# Patient Record
Sex: Female | Born: 2000 | Race: Black or African American | Hispanic: No | Marital: Single | Smoking: Former smoker
Health system: Southern US, Community
[De-identification: ages and names within clinical notes are randomized; demographics above are authoritative.]

## PROBLEM LIST (undated history)

## (undated) DIAGNOSIS — F32A Depression, unspecified: Secondary | ICD-10-CM

## (undated) DIAGNOSIS — N151 Renal and perinephric abscess: Secondary | ICD-10-CM

## (undated) DIAGNOSIS — E785 Hyperlipidemia, unspecified: Secondary | ICD-10-CM

## (undated) HISTORY — PX: NO PAST SURGERIES: SHX2092

## (undated) HISTORY — DX: Hyperlipidemia, unspecified: E78.5

---
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
## 2021-02-26 IMAGING — US US MFM OB FOLLOW-UP
1 series · 13 of 28 positions shown · non-contrast
Comparison: none

[Series 1: us mfm ob follow-up · 54 acquisitions, 13 frames shown]
[im 2/54]
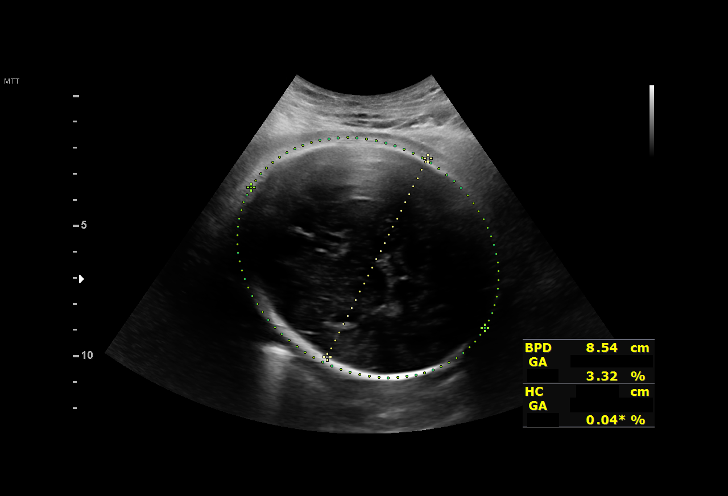
[im 6/54]
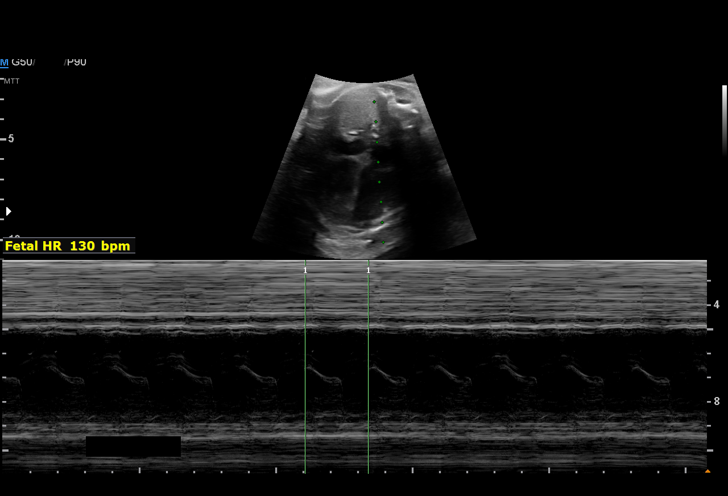
[im 10/54]
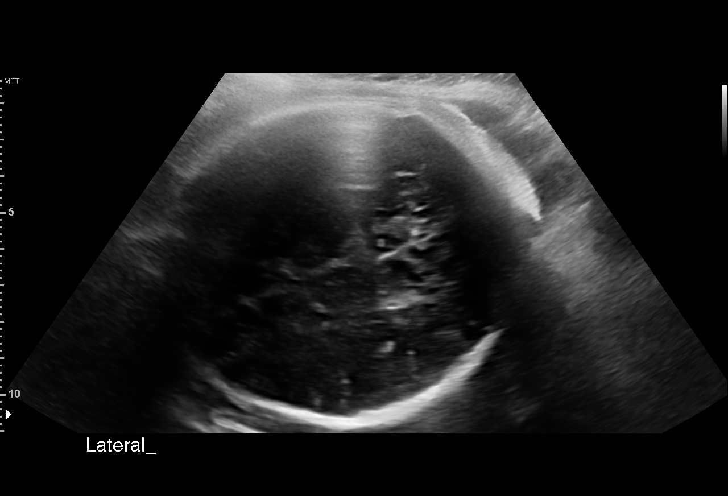
[im 14/54]
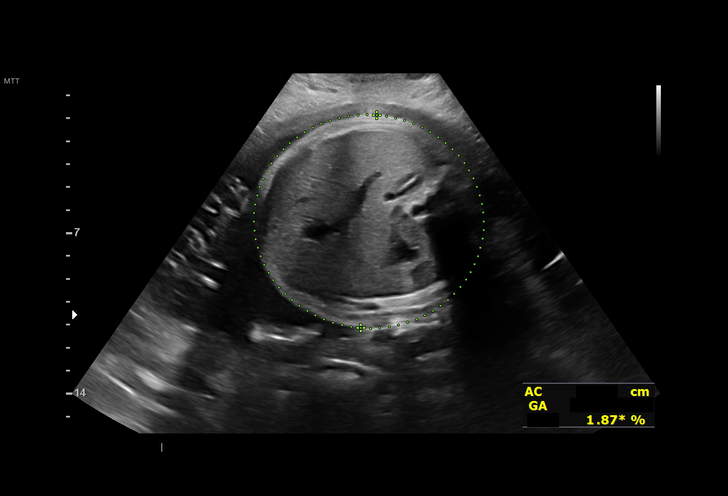
[im 18/54]
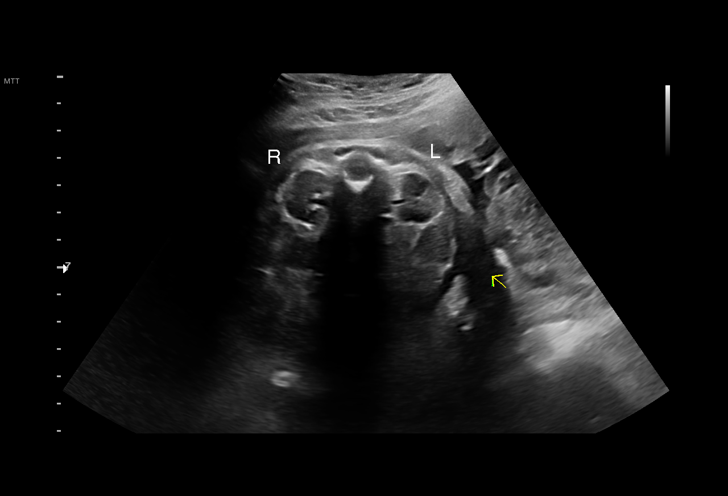
[im 22/54]
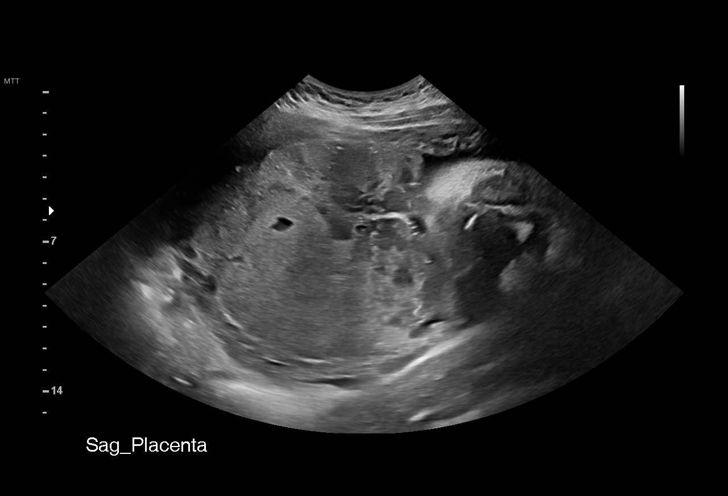
[im 28/54]
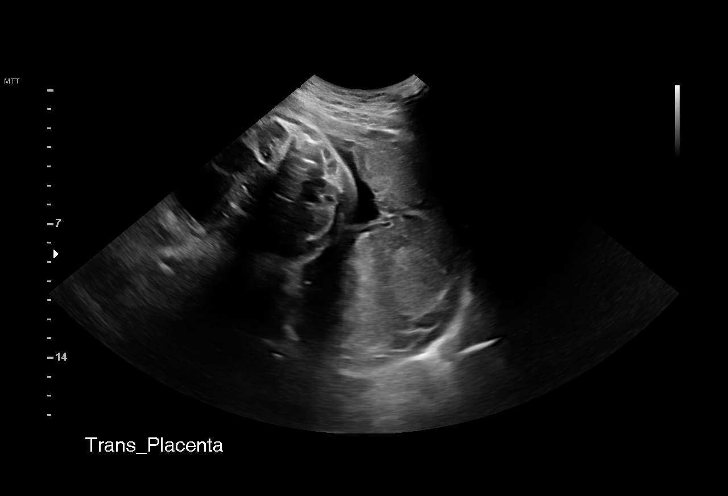
[im 32/54]
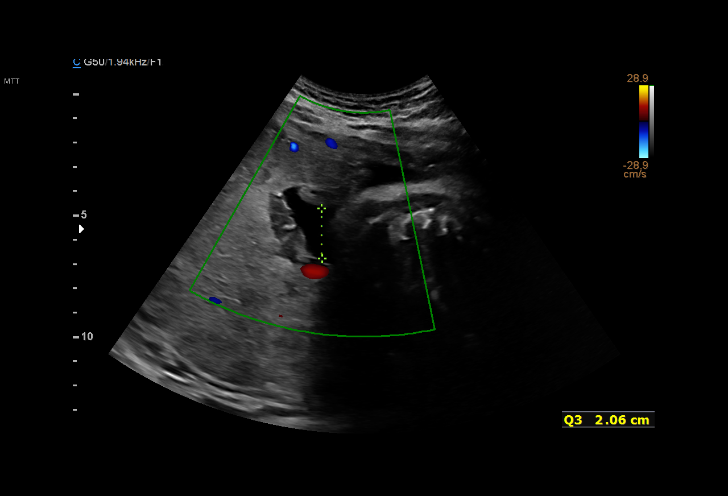
[im 36/54]
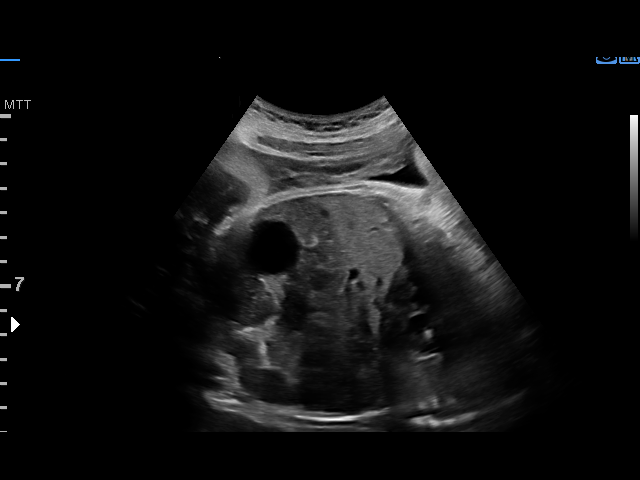
[im 40/54]
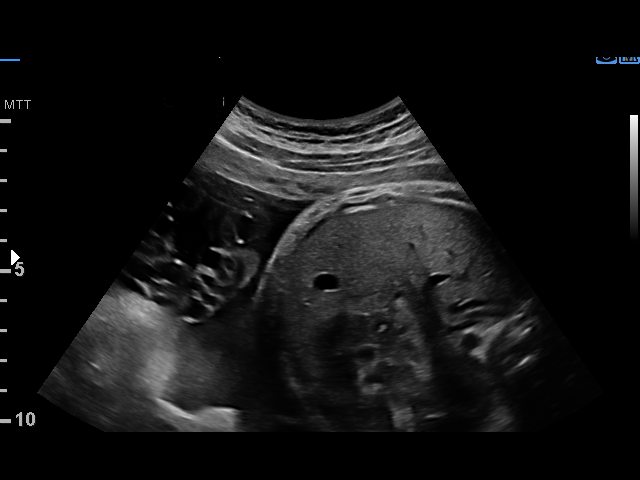
[im 44/54]
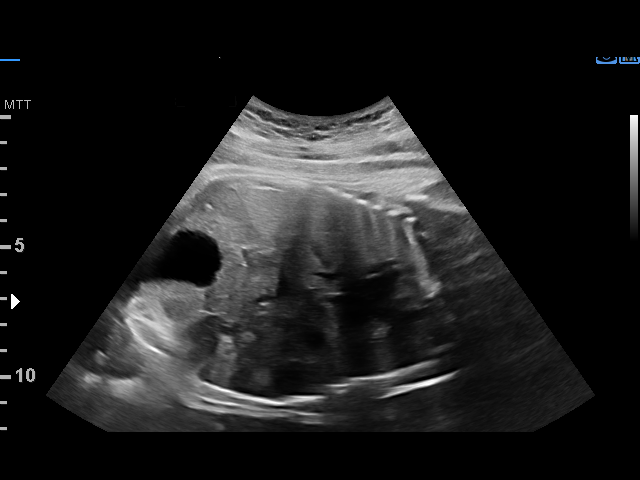
[im 48/54]
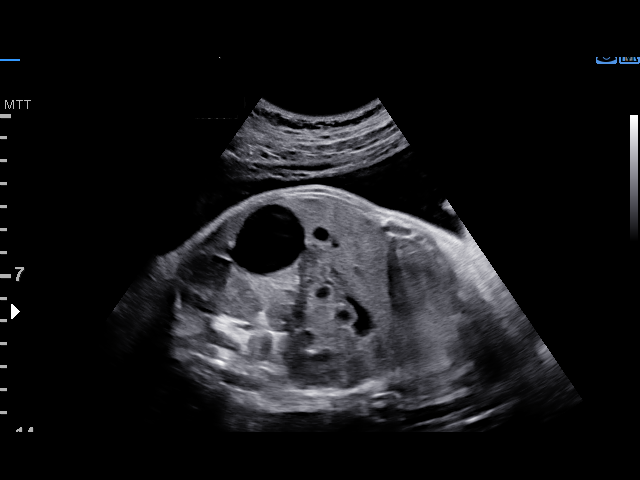
[im 52/54]
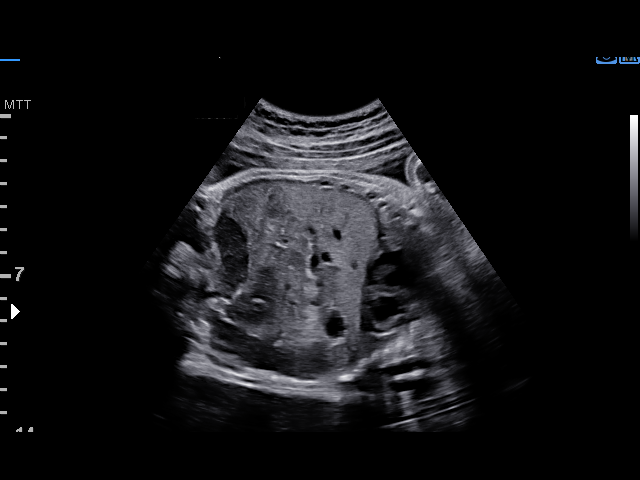

[13 of 28 positions shown; findings below may reference images not displayed]

Indications

 37 weeks gestation of pregnancy
 Maternal care for known or suspected poor
 fetal growth, third trimester, fetus 1 IUGR
 Small for gestational age fetus affecting
 management of mother
 Low Risk NIPS
Fetal Evaluation

 Num Of Fetuses:         1
 Fetal Heart Rate(bpm):  130
 Cardiac Activity:       Observed
 Presentation:           Cephalic
 Placenta:               Posterior Fundal
 P. Cord Insertion:      Previously Visualized

 Amniotic Fluid
 AFI FV:      Within normal limits

 AFI Sum(cm)     %Tile       Largest Pocket(cm)
 18.3            71

 RUQ(cm)       RLQ(cm)       LUQ(cm)        LLQ(cm)

Biophysical Evaluation

 Amniotic F.V:   Pocket => 2 cm             F. Tone:        Observed
 F. Movement:    Observed                   Score:          [DATE]
 F. Breathing:   Observed
Biometry

 BPD:      85.1  mm     G. Age:  34w 2d        2.7  %    CI:        79.34   %    70 - 86
                                                         FL/HC:      21.4   %    20.9 -
 HC:       302   mm     G. Age:  33w 4d        < 1  %    HC/AC:      0.99        0.92 -
 AC:      304.3  mm     G. Age:  34w 3d        2.3  %    FL/BPD:     76.0   %    71 - 87
 FL:       64.7  mm     G. Age:  33w 3d        < 1  %    FL/AC:      21.3   %    20 - 24
 LV:        3.2  mm

 Est. FW:    7177  gm      5 lb 2 oz      2  %
OB History

 Gravidity:    1         Term:   0
 Living:       0
Gestational Age

 U/S Today:     34w 0d                                        EDD:   12/27/19
 Best:          37w 4d     Det. By:  Previous Ultrasound      EDD:   12/02/19
                                     (04/09/19)
Anatomy

 Cranium:               Previously seen        Aortic Arch:            Previously seen
 Cavum:                 Previously seen        Ductal Arch:            Previously seen
 Ventricles:            Appears normal         Diaphragm:              Appears normal
 Choroid Plexus:        Previously seen        Stomach:                Appears normal, left
                                                                       sided
 Cerebellum:            Previously seen        Abdomen:                Previously seen
 Posterior Fossa:       Previously seen        Abdominal Wall:         Previously seen
 Nuchal Fold:           Previously seen        Cord Vessels:           Previously seen
 Face:                  Orbits and profile     Kidneys:                Appear normal
                        previously seen
 Lips:                  Previously seen        Bladder:                Appears normal
 Thoracic:              Previously seen        Spine:                  Previously seen
 Heart:                 Previously seen        Upper Extremities:      Previously seen
 RVOT:                  Previously seen        Lower Extremities:      Previously seen
 LVOT:                  Previously seen

 Other:  Technically difficult due to advanced GA and fetal position.
Doppler - Fetal Vessels

 Umbilical Artery
  S/D     %tile      RI    %tile                             ADFV    RDFV
  3.12       90    0.68       92                                No      No

Cervix Uterus Adnexa
 Cervix
 Not visualized (advanced GA >36wks)

 Uterus
 No abnormality visualized.

 Right Ovary
 Within normal limits. No adnexal mass visualized.

 Left Ovary
 Within normal limits. No adnexal mass visualized.

 Cul De Sac
 No free fluid seen.

 Adnexa
 No abnormality visualized.
Impression

 Patient with fetal growth restriction returned for growth and
 antenatal testing.

 The estimated fetal weight is at the 2nd percentile. HC
 measurement is at between -2 and -1 SD (no evidence of
 microcephaly). Interval weight gain is 338 grams
 (suboptimal). Amniotic fluid is normal and good fetal activity
 is seen .Antenatal testing is reassuring. BPP [DATE]. Umbilical
 artery Doppler showed normal forward diastolic flow .

 I counseled the couple on the finding of severe fetal growth
 restriction. Ultrasound has limitations in accurately-estimating
 fetal weights. I recommended delivery.

 Patient agreed to be delivered.

 Discussed with Dr. Sinorigen, Ob Attending, who will be arranging
 induction of labor.
                 Arraa, Ahumadh

## 2021-04-08 ENCOUNTER — Other Ambulatory Visit: Payer: Self-pay

## 2021-04-08 ENCOUNTER — Encounter: Payer: Self-pay | Admitting: Emergency Medicine

## 2021-04-08 ENCOUNTER — Ambulatory Visit
Admission: EM | Admit: 2021-04-08 | Discharge: 2021-04-08 | Disposition: A | Discharge status: Home / Self Care | Payer: Medicaid Other | Attending: Emergency Medicine | Admitting: Emergency Medicine

## 2021-04-08 DIAGNOSIS — H66003 Acute suppurative otitis media without spontaneous rupture of ear drum, bilateral: Secondary | ICD-10-CM

## 2021-04-08 MED ORDER — CEFDINIR 300 MG PO CAPS: 300.0000 mg | ORAL_CAPSULE | Freq: Two times a day (BID) | ORAL | 0 refills | Status: AC

## 2021-04-08 MED ORDER — IBUPROFEN 800 MG PO TABS: 800.0000 mg | ORAL_TABLET | Freq: Three times a day (TID) | ORAL | 0 refills | Status: DC | PRN

## 2021-04-08 MED ORDER — KETOROLAC TROMETHAMINE 60 MG/2ML IM SOLN: 60.0000 mg | Freq: Once | INTRAMUSCULAR | Status: DC

## 2021-04-08 NOTE — ED Triage Notes (Signed)
Pt c/o right ear pain that started today.  ?

## 2021-04-08 NOTE — Discharge Instructions (Addendum)
Please begin cefdinir now, 1 capsule twice daily for your ear infections in both ears.  Please also begin ibuprofen 800 mg for pain relief.  Please return in 3 to 5 days if you have not had some improvement of your symptoms. ?

## 2021-04-08 NOTE — ED Provider Notes (Signed)
[facility]    CSN: [id number] Arrival date & time: 04/08/21  1242    HISTORY   Chief Complaint  Patient presents with   Otalgia   HPI Rachael [name] is a 21 y.o. female. Patient complains of acute onset of right-sided ear pain.  Patient is tearful and exhibiting signs of mild hysteria as I walk in the room today.  Patient so reeks of cigarette smoke.  Not able to obtain meaningful history from the patient as she keeps biting me to make the pain stop.  The history is provided by the patient.  Past Medical History:  Diagnosis Date   Depression    few yrs ago [facility](BH), doing good now   Hyperlipidemia    Kidney abscess    Patient Active Problem List   Diagnosis Date Noted   Vaginal delivery 11/16/2019   IUGR (intrauterine growth restriction) affecting care of mother, third trimester, fetus 1 11/15/2019   Pregnancy affected by fetal growth restriction 10/08/2019   Supervision of normal pregnancy, antepartum 05/15/2019   High risk sexual behavior in adolescent    Pyelonephritis 06/21/2017   Suicidal ideation 04/02/2016   MDD (major depressive disorder) 04/01/2016   Past Surgical History:  Procedure Laterality Date   NO PAST SURGERIES     OB History     Gravida  1   Para  1   Term  1   Preterm      AB      Living  1      SAB      IAB      Ectopic      Multiple  0   Live Births  1          Home Medications    Prior to Admission medications   Medication Sig Start Date End Date Taking? Authorizing Provider  cefdinir (OMNICEF) 300 MG capsule Take 1 capsule (300 mg total) by mouth 2 (two) times daily for 10 days. 04/08/21 04/18/21 Yes Theadora RamaMorgan, Woodruff Skirvin Scales, PA-C  ibuprofen (ADVIL) 800 MG tablet Take 1 tablet (800 mg total) by mouth every 8 (eight) hours as needed for up to 21 doses for fever, headache, mild pain or moderate pain. 04/08/21  Yes Theadora RamaMorgan, Keiyon Plack Scales, PA-C  acetaminophen (TYLENOL) 325 MG tablet Take 2 tablets (650 mg total) by  mouth every 4 (four) hours as needed (for pain scale < 4). 11/18/19   Gita KudoMarsala, Julia M, MD  docusate sodium (COLACE) 250 MG capsule Take 1 capsule (250 mg total) by mouth daily. Patient taking differently: Take 250 mg by mouth daily as needed for constipation.  06/23/19   Rolm BookbinderNeill, Caroline M, CNM  ferrous sulfate (FERROUSUL) 325 (65 FE) MG tablet Take 1 tablet (325 mg total) by mouth every other day. 09/13/19   Leftwich-Kirby, Wilmer FloorLisa A, CNM  ondansetron (ZOFRAN ODT) 4 MG disintegrating tablet Take 1 tablet (4 mg total) by mouth every 8 (eight) hours as needed for nausea or vomiting. 06/23/19   Rolm BookbinderNeill, Caroline M, CNM  Prenat-FeCbn-FeAsp-Meth-FA-DHA (PRENATE MINI) 18-0.6-0.4-350 MG CAPS Take 1 tablet by mouth daily. 09/13/19   Leftwich-Kirby, Wilmer FloorLisa A, CNM  Prenatal Vit-Fe Phos-FA-Omega (VITAFOL GUMMIES) 3.33-0.333-34.8 MG CHEW Chew 3 each by mouth daily. Patient not taking: Reported on 11/18/2019 05/21/19   Constant, Peggy, MD   Family History Family History  Problem Relation Age of Onset   Asthma Mother    Hypertension Other    Hyperlipidemia Maternal Grandmother    Hypertension Maternal Grandmother    Diabetes  Paternal Grandmother    Social History Social History   Tobacco Use   Smoking status: Former    Packs/day: 0.33    Years: 3.00    Pack years: 0.99    Types: Cigarettes    Quit date: 04/02/2019    Years since quitting: 2.0    Passive exposure: Current   Smokeless tobacco: Never  Vaping Use   Vaping Use: Never used  Substance Use Topics   Alcohol use: No   Drug use: Yes    Frequency: 7.0 times per week    Types: Marijuana    Comment: last was today   Allergies   Patient has no known allergies.  Review of Systems Review of Systems Pertinent findings noted in history of present illness.   Physical Exam Triage Vital Signs ED Triage Vitals  Enc Vitals Group     BP 11/28/20 0827 (!) 147/82     Pulse Rate 11/28/20 0827 72     Resp 11/28/20 0827 18     Temp 11/28/20 0827 98.3  F (36.8 C)     Temp Source 11/28/20 0827 Oral     SpO2 11/28/20 0827 98 %     Weight --      Height --      Head Circumference --      Peak Flow --      Pain Score 11/28/20 0826 5     Pain Loc --      Pain Edu? --      Excl. in GC? --   No data found.  Updated Vital Signs BP 135/88 (BP Location: Left Arm)    Pulse (!) 110    Temp 98.5 F (36.9 C) (Oral)    Resp 17    LMP 03/08/2021    SpO2 96%   Physical Exam Vitals and nursing note reviewed.  Constitutional:      General: She is not in acute distress.    Appearance: Normal appearance. She is not ill-appearing.  HENT:     Head: Normocephalic and atraumatic.     Salivary Glands: Right salivary gland is not diffusely enlarged or tender. Left salivary gland is not diffusely enlarged or tender.     Right Ear: Ear canal normal. Tenderness present. No drainage. A middle ear effusion (Purulent) is present. There is no impacted cerumen. There is mastoid tenderness. There is hemotympanum. Tympanic membrane is erythematous and bulging. Tympanic membrane is not perforated.     Left Ear: Ear canal and external ear normal. No drainage. A middle ear effusion (Purulent) is present. There is no impacted cerumen. Tympanic membrane is erythematous and bulging. Tympanic membrane is not perforated.     Nose: Nose normal. No nasal deformity, septal deviation, mucosal edema, congestion or rhinorrhea.     Right Turbinates: Not enlarged, swollen or pale.     Left Turbinates: Not enlarged, swollen or pale.     Right Sinus: No maxillary sinus tenderness or frontal sinus tenderness.     Left Sinus: No maxillary sinus tenderness or frontal sinus tenderness.     Mouth/Throat:     Lips: Pink. No lesions.     Mouth: Mucous membranes are moist. No oral lesions.     Pharynx: Oropharynx is clear. Uvula midline. No posterior oropharyngeal erythema or uvula swelling.     Tonsils: No tonsillar exudate. 0 on the right. 0 on the left.  Eyes:     General: Lids are  normal.        Right  eye: No discharge.        Left eye: No discharge.     Extraocular Movements: Extraocular movements intact.     Conjunctiva/sclera: Conjunctivae normal.     Right eye: Right conjunctiva is not injected.     Left eye: Left conjunctiva is not injected.  Neck:     Trachea: Trachea and phonation normal.  Cardiovascular:     Rate and Rhythm: Normal rate and regular rhythm.     Pulses: Normal pulses.     Heart sounds: Normal heart sounds. No murmur heard.   No friction rub. No gallop.  Pulmonary:     Effort: Pulmonary effort is normal. No accessory muscle usage, prolonged expiration or respiratory distress.     Breath sounds: Normal breath sounds. No stridor, decreased air movement or transmitted upper airway sounds. No decreased breath sounds, wheezing, rhonchi or rales.  Chest:     Chest wall: No tenderness.  Musculoskeletal:        General: Normal range of motion.     Cervical back: Normal range of motion and neck supple. Normal range of motion.  Lymphadenopathy:     Cervical: No cervical adenopathy.  Skin:    General: Skin is warm and dry.     Findings: No erythema or rash.  Neurological:     General: No focal deficit present.     Mental Status: She is alert and oriented to person, place, and time.  Psychiatric:        Mood and Affect: Mood normal.        Behavior: Behavior normal.    Visual Acuity Right Eye Distance:   Left Eye Distance:   Bilateral Distance:    Right Eye Near:   Left Eye Near:    Bilateral Near:     UC Couse / Diagnostics / Procedures:    EKG  Radiology No results found.  Procedures Procedures (including critical care time)  UC Diagnoses / Final Clinical Impressions(s)   I have reviewed the triage vital signs and the nursing notes.  Pertinent labs & imaging results that were available during my care of the patient were reviewed by me and considered in my medical decision making (see chart for details).   Final diagnoses:   Acute suppurative otitis media of both ears without spontaneous rupture of tympanic membranes, recurrence not specified   Patient offered ketorolac injection for more rapid relief of her pain, patient politely declined.  Patient provided with a prescription for cefdinir and ibuprofen 800 mg.  Patient also politely declined ibuprofen 800 mg tablet in the office.  Return precautions advised.  ED Prescriptions     Medication Sig Dispense Auth. Provider   cefdinir (OMNICEF) 300 MG capsule Take 1 capsule (300 mg total) by mouth 2 (two) times daily for 10 days. 20 capsule Theadora Rama Scales, PA-C   ibuprofen (ADVIL) 800 MG tablet Take 1 tablet (800 mg total) by mouth every 8 (eight) hours as needed for up to 21 doses for fever, headache, mild pain or moderate pain. 21 tablet Theadora Rama Scales, PA-C      PDMP not reviewed this encounter.  Pending results:  Labs Reviewed - No data to display  Medications Ordered in UC: Medications - No data to display  Disposition Upon Discharge:  Condition: stable for discharge home Home: take medications as prescribed; routine discharge instructions as discussed; follow up as advised.  Patient presented with an acute illness with associated systemic symptoms and significant discomfort requiring urgent management.  In my opinion, this is a condition that a prudent lay person (someone who possesses an average knowledge of health and medicine) may potentially expect to result in complications if not addressed urgently such as respiratory distress, impairment of bodily function or dysfunction of bodily organs.   Routine symptom specific, illness specific and/or disease specific instructions were discussed with the patient and/or caregiver at length.   As such, the patient has been evaluated and assessed, work-up was performed and treatment was provided in alignment with urgent care protocols and evidence based medicine.  Patient/parent/caregiver has been  advised that the patient may require follow up for further testing and treatment if the symptoms continue in spite of treatment, as clinically indicated and appropriate.  If the patient was tested for COVID-19, Influenza and/or RSV, then the patient/parent/guardian was advised to isolate at home pending the results of his/her diagnostic coronavirus test and potentially longer if theyre positive. I have also advised pt that if his/her COVID-19 test returns positive, it's recommended to self-isolate for at least 10 days after symptoms first appeared AND until fever-free for 24 hours without fever reducer AND other symptoms have improved or resolved. Discussed self-isolation recommendations as well as instructions for household member/close contacts as per the [facility] and [place] DHHS, and also gave patient the COVID packet with this information.  Patient/parent/caregiver has been advised to return to the [facility] or PCP in 3-5 days if no better; to PCP or the Emergency Department if new signs and symptoms develop, or if the current signs or symptoms continue to change or worsen for further workup, evaluation and treatment as clinically indicated and appropriate  The patient will follow up with their current PCP if and as advised. If the patient does not currently have a PCP we will assist them in obtaining one.   The patient may need specialty follow up if the symptoms continue, in spite of conservative treatment and management, for further workup, evaluation, consultation and treatment as clinically indicated and appropriate.  Patient/parent/caregiver verbalized understanding and agreement of plan as discussed.  All questions were addressed during visit.  Please see discharge instructions below for further details of plan.  Discharge Instructions:   Discharge Instructions      Please begin cefdinir now, 1 capsule twice daily for your ear infections in both ears.  Please also begin ibuprofen 800 mg for pain  relief.  Please return in 3 to 5 days if you have not had some improvement of your symptoms.    This office note has been dictated using Teaching laboratory technician.  Unfortunately, and despite my best efforts, this method of dictation can sometimes lead to occasional typographical or grammatical errors.  I apologize in advance if this occurs.     Theadora Rama Scales, PA-C 04/08/21 1320

## 2021-11-18 ENCOUNTER — Ambulatory Visit
Admission: EM | Admit: 2021-11-18 | Discharge: 2021-11-18 | Disposition: A | Discharge status: Home / Self Care | Payer: Medicaid Other | Attending: Urgent Care | Admitting: Urgent Care

## 2021-11-18 DIAGNOSIS — Z1152 Encounter for screening for COVID-19: Secondary | ICD-10-CM | POA: Insufficient documentation

## 2021-11-18 DIAGNOSIS — F1290 Cannabis use, unspecified, uncomplicated: Secondary | ICD-10-CM | POA: Insufficient documentation

## 2021-11-18 DIAGNOSIS — R052 Subacute cough: Secondary | ICD-10-CM | POA: Diagnosis not present

## 2021-11-18 DIAGNOSIS — B349 Viral infection, unspecified: Secondary | ICD-10-CM | POA: Diagnosis not present

## 2021-11-18 MED ORDER — PROMETHAZINE-DM 6.25-15 MG/5ML PO SYRP: 2.5000 mL | ORAL_SOLUTION | Freq: Three times a day (TID) | ORAL | 0 refills | Status: DC | PRN

## 2021-11-18 MED ORDER — PREDNISONE 20 MG PO TABS: ORAL_TABLET | ORAL | 0 refills | Status: DC

## 2021-11-18 MED ORDER — CETIRIZINE HCL 10 MG PO TABS: 10.0000 mg | ORAL_TABLET | Freq: Every day | ORAL | 0 refills | Status: DC

## 2021-11-18 NOTE — ED Provider Notes (Signed)
[name]  Note:  This document was prepared using Conservation officer, historic buildings and may include unintentional dictation errors.  MRN: [id number] DOB: [birth year]  Subjective:   Rachael [name] is a 21 y.o. female presenting for 3-day history of persistent sinus congestion, sinus drainage, coughing. Has history of bronchitis and believes she has asthma. There is a family history of asthma as well with her mother. Smokes marijuana daily 3-4 times daily.  Would like a note for work.  She would like a COVID test and is interested in taking Paxlovid should she test positive.  No history of kidney disease, does not want blood work to confirm this.  No current facility-administered medications for this encounter.  Current Outpatient Medications:    acetaminophen (TYLENOL) 325 MG tablet, Take 2 tablets (650 mg total) by mouth every 4 (four) hours as needed (for pain scale < 4)., Disp: , Rfl:    docusate sodium (COLACE) 250 MG capsule, Take 1 capsule (250 mg total) by mouth daily. (Patient taking differently: Take 250 mg by mouth daily as needed for constipation. ), Disp: 30 capsule, Rfl: 0   ferrous sulfate (FERROUSUL) 325 (65 FE) MG tablet, Take 1 tablet (325 mg total) by mouth every other day., Disp: 30 tablet, Rfl: 2   ibuprofen (ADVIL) 800 MG tablet, Take 1 tablet (800 mg total) by mouth every 8 (eight) hours as needed for up to 21 doses for fever, headache, mild pain or moderate pain., Disp: 21 tablet, Rfl: 0   ondansetron (ZOFRAN ODT) 4 MG disintegrating tablet, Take 1 tablet (4 mg total) by mouth every 8 (eight) hours as needed for nausea or vomiting., Disp: 30 tablet, Rfl: 0   Prenat-FeCbn-FeAsp-Meth-FA-DHA (PRENATE MINI) 18-0.6-0.4-350 MG CAPS, Take 1 tablet by mouth daily., Disp: 30 capsule, Rfl: 10   Prenatal Vit-Fe Phos-FA-Omega (VITAFOL GUMMIES) 3.33-0.333-34.8 MG CHEW, Chew 3 each by mouth daily. (Patient not taking: Reported on 11/18/2019), Disp: 90 tablet,  Rfl: 11   No Known Allergies  Past Medical History:  Diagnosis Date   Depression    few yrs ago (BH), doing good now   Hyperlipidemia    Kidney abscess      Past Surgical History:  Procedure Laterality Date   NO PAST SURGERIES      Family History  Problem Relation Age of Onset   Asthma Mother    Hypertension Other    Hyperlipidemia Maternal Grandmother    Hypertension Maternal Grandmother    Diabetes Paternal Grandmother     Social History   Tobacco Use   Smoking status: Former    Packs/day: 0.33    Years: 3.00    Total pack years: 0.99    Types: Cigarettes    Quit date: 04/02/2019    Years since quitting: 2.6    Passive exposure: Current   Smokeless tobacco: Never  Vaping Use   Vaping Use: Never used  Substance Use Topics   Alcohol use: No   Drug use: Yes    Frequency: 7.0 times per week    Types: Marijuana    Comment: last was today    ROS   Objective:   Vitals: BP 113/77 (BP Location: Right Arm)   Pulse (!) 103   Temp 98.8 F (37.1 C) (Oral)   Resp 16   LMP 11/11/2021 (Approximate)   SpO2 97%   Physical Exam Constitutional:      General: She is not in acute distress.    Appearance: Normal appearance. She is  well-developed. She is obese. She is not ill-appearing, toxic-appearing or diaphoretic.  HENT:     Head: Normocephalic and atraumatic.     Nose: Congestion and rhinorrhea present.     Mouth/Throat:     Mouth: Mucous membranes are moist.     Pharynx: No oropharyngeal exudate or posterior oropharyngeal erythema.     Comments: Postnasal drainage overlying pharynx. Eyes:     General: No scleral icterus.       Right eye: No discharge.        Left eye: No discharge.     Extraocular Movements: Extraocular movements intact.  Cardiovascular:     Rate and Rhythm: Normal rate and regular rhythm.     Heart sounds: Normal heart sounds. No murmur heard.    No friction rub. No gallop.  Pulmonary:     Effort: Pulmonary effort is normal. No  respiratory distress.     Breath sounds: No stridor. No wheezing, rhonchi or rales.  Chest:     Chest wall: No tenderness.  Skin:    General: Skin is warm and dry.  Neurological:     General: No focal deficit present.     Mental Status: She is alert and oriented to person, place, and time.  Psychiatric:        Mood and Affect: Mood normal.        Behavior: Behavior normal.     Assessment and Plan :   PDMP not reviewed this encounter.  1. Acute viral syndrome   2. Subacute cough   3. Marijuana use     In the context of her smoking, history of bronchitis and suspicion for asthma I offered an oral prednisone course.  Otherwise, will manage for viral illness such as viral URI, viral syndrome, viral rhinitis, COVID-19. Recommended supportive care. Offered scripts for symptomatic relief. Testing is pending.  Patient would be a good candidate for Paxlovid should she test positive for this.  She declined blood work for her GFR.  Chart review shows that she has not had kidney disease.  Counseled patient on potential for adverse effects with medications prescribed/recommended today, ER and return-to-clinic precautions discussed, patient verbalized understanding.     Jaynee Eagles, [place] 11/18/21 1926

## 2021-11-18 NOTE — Discharge Instructions (Signed)
We will notify you of your test results as they arrive and may take between about 24 hours.  I encourage you to sign up for MyChart if you have not already done so as this can be the easiest way for [place] to communicate results to you online or through a phone app.  Generally, we only contact you if it is a positive test result.  In the meantime, if you develop worsening symptoms including fever, chest pain, shortness of breath despite our current treatment plan then please report to the emergency room as this may be a sign of worsening status from possible viral infection.  Otherwise, we will manage this as a viral syndrome. For sore throat or cough try using a honey-based tea. Use 3 teaspoons of honey with juice squeezed from half lemon. Place shaved pieces of ginger into 1/2-1 cup of water and warm over stove top. Then mix the ingredients and repeat every 4 hours as needed. Please take Tylenol 500mg -650mg  every 6 hours for aches and pains, fevers. Hydrate very well with at least 2 liters of water. Eat light meals such as soups to replenish electrolytes and soft fruits, veggies. Start an antihistamine like Zyrtec for postnasal drainage, sinus congestion.  You can take this together with prednisone, a steroid that can help you with your coughing, breathing.  Use the cough medications as needed.

## 2021-11-18 NOTE — ED Triage Notes (Signed)
Pt states cough and congestion for the past 3 days.

## 2021-11-20 LAB — SARS CORONAVIRUS 2 (TAT 6-24 HRS): SARS Coronavirus 2: NEGATIVE

## 2022-03-19 ENCOUNTER — Emergency Department (HOSPITAL_BASED_OUTPATIENT_CLINIC_OR_DEPARTMENT_OTHER): Payer: Medicaid Other

## 2022-03-19 ENCOUNTER — Inpatient Hospital Stay (HOSPITAL_BASED_OUTPATIENT_CLINIC_OR_DEPARTMENT_OTHER)
Admission: EM | Admit: 2022-03-19 | Discharge: 2022-05-03 | DRG: 004 | Disposition: E | Payer: Medicaid Other | Attending: Internal Medicine | Admitting: Internal Medicine

## 2022-03-19 ENCOUNTER — Inpatient Hospital Stay (HOSPITAL_COMMUNITY): Payer: Medicaid Other

## 2022-03-19 DIAGNOSIS — G9511 Acute infarction of spinal cord (embolic) (nonembolic): Secondary | ICD-10-CM | POA: Diagnosis present

## 2022-03-19 DIAGNOSIS — J9601 Acute respiratory failure with hypoxia: Secondary | ICD-10-CM | POA: Diagnosis not present

## 2022-03-19 DIAGNOSIS — J9612 Chronic respiratory failure with hypercapnia: Secondary | ICD-10-CM

## 2022-03-19 DIAGNOSIS — E669 Obesity, unspecified: Secondary | ICD-10-CM | POA: Diagnosis present

## 2022-03-19 DIAGNOSIS — R532 Functional quadriplegia: Secondary | ICD-10-CM | POA: Diagnosis present

## 2022-03-19 DIAGNOSIS — Z529 Donor of unspecified organ or tissue: Secondary | ICD-10-CM | POA: Diagnosis not present

## 2022-03-19 DIAGNOSIS — H5509 Other forms of nystagmus: Secondary | ICD-10-CM | POA: Diagnosis not present

## 2022-03-19 DIAGNOSIS — E871 Hypo-osmolality and hyponatremia: Secondary | ICD-10-CM | POA: Diagnosis present

## 2022-03-19 DIAGNOSIS — F1210 Cannabis abuse, uncomplicated: Secondary | ICD-10-CM | POA: Diagnosis present

## 2022-03-19 DIAGNOSIS — R509 Fever, unspecified: Secondary | ICD-10-CM

## 2022-03-19 DIAGNOSIS — R4182 Altered mental status, unspecified: Secondary | ICD-10-CM | POA: Diagnosis not present

## 2022-03-19 DIAGNOSIS — F1010 Alcohol abuse, uncomplicated: Secondary | ICD-10-CM | POA: Diagnosis present

## 2022-03-19 DIAGNOSIS — N319 Neuromuscular dysfunction of bladder, unspecified: Secondary | ICD-10-CM | POA: Diagnosis not present

## 2022-03-19 DIAGNOSIS — N179 Acute kidney failure, unspecified: Secondary | ICD-10-CM | POA: Diagnosis not present

## 2022-03-19 DIAGNOSIS — I63013 Cerebral infarction due to thrombosis of bilateral vertebral arteries: Secondary | ICD-10-CM | POA: Diagnosis not present

## 2022-03-19 DIAGNOSIS — R739 Hyperglycemia, unspecified: Secondary | ICD-10-CM | POA: Diagnosis not present

## 2022-03-19 DIAGNOSIS — E785 Hyperlipidemia, unspecified: Secondary | ICD-10-CM | POA: Diagnosis present

## 2022-03-19 DIAGNOSIS — R0682 Tachypnea, not elsewhere classified: Secondary | ICD-10-CM | POA: Diagnosis not present

## 2022-03-19 DIAGNOSIS — Z66 Do not resuscitate: Secondary | ICD-10-CM | POA: Diagnosis not present

## 2022-03-19 DIAGNOSIS — F17200 Nicotine dependence, unspecified, uncomplicated: Secondary | ICD-10-CM | POA: Diagnosis not present

## 2022-03-19 DIAGNOSIS — G4700 Insomnia, unspecified: Secondary | ICD-10-CM | POA: Diagnosis not present

## 2022-03-19 DIAGNOSIS — J9611 Chronic respiratory failure with hypoxia: Secondary | ICD-10-CM | POA: Diagnosis not present

## 2022-03-19 DIAGNOSIS — E8729 Other acidosis: Secondary | ICD-10-CM | POA: Diagnosis present

## 2022-03-19 DIAGNOSIS — I639 Cerebral infarction, unspecified: Secondary | ICD-10-CM | POA: Diagnosis not present

## 2022-03-19 DIAGNOSIS — Y95 Nosocomial condition: Secondary | ICD-10-CM | POA: Diagnosis not present

## 2022-03-19 DIAGNOSIS — I6339 Cerebral infarction due to thrombosis of other cerebral artery: Secondary | ICD-10-CM | POA: Diagnosis not present

## 2022-03-19 DIAGNOSIS — G8250 Quadriplegia, unspecified: Secondary | ICD-10-CM | POA: Diagnosis not present

## 2022-03-19 DIAGNOSIS — R1310 Dysphagia, unspecified: Secondary | ICD-10-CM | POA: Diagnosis present

## 2022-03-19 DIAGNOSIS — Z83438 Family history of other disorder of lipoprotein metabolism and other lipidemia: Secondary | ICD-10-CM

## 2022-03-19 DIAGNOSIS — Z6831 Body mass index (BMI) 31.0-31.9, adult: Secondary | ICD-10-CM

## 2022-03-19 DIAGNOSIS — M419 Scoliosis, unspecified: Secondary | ICD-10-CM | POA: Diagnosis present

## 2022-03-19 DIAGNOSIS — I6389 Other cerebral infarction: Secondary | ICD-10-CM

## 2022-03-19 DIAGNOSIS — R609 Edema, unspecified: Secondary | ICD-10-CM | POA: Diagnosis not present

## 2022-03-19 DIAGNOSIS — Z430 Encounter for attention to tracheostomy: Secondary | ICD-10-CM | POA: Diagnosis not present

## 2022-03-19 DIAGNOSIS — Z515 Encounter for palliative care: Secondary | ICD-10-CM

## 2022-03-19 DIAGNOSIS — E876 Hypokalemia: Secondary | ICD-10-CM | POA: Diagnosis not present

## 2022-03-19 DIAGNOSIS — R001 Bradycardia, unspecified: Secondary | ICD-10-CM | POA: Diagnosis not present

## 2022-03-19 DIAGNOSIS — J690 Pneumonitis due to inhalation of food and vomit: Secondary | ICD-10-CM | POA: Diagnosis not present

## 2022-03-19 DIAGNOSIS — J189 Pneumonia, unspecified organism: Secondary | ICD-10-CM | POA: Diagnosis present

## 2022-03-19 DIAGNOSIS — R578 Other shock: Secondary | ICD-10-CM | POA: Diagnosis present

## 2022-03-19 DIAGNOSIS — J9811 Atelectasis: Secondary | ICD-10-CM | POA: Diagnosis present

## 2022-03-19 DIAGNOSIS — D6489 Other specified anemias: Secondary | ICD-10-CM | POA: Diagnosis present

## 2022-03-19 DIAGNOSIS — Z1152 Encounter for screening for COVID-19: Secondary | ICD-10-CM

## 2022-03-19 DIAGNOSIS — N898 Other specified noninflammatory disorders of vagina: Secondary | ICD-10-CM | POA: Diagnosis present

## 2022-03-19 DIAGNOSIS — H5500 Unspecified nystagmus: Secondary | ICD-10-CM | POA: Diagnosis not present

## 2022-03-19 DIAGNOSIS — I63219 Cerebral infarction due to unspecified occlusion or stenosis of unspecified vertebral arteries: Secondary | ICD-10-CM | POA: Diagnosis not present

## 2022-03-19 DIAGNOSIS — J9622 Acute and chronic respiratory failure with hypercapnia: Secondary | ICD-10-CM | POA: Diagnosis present

## 2022-03-19 DIAGNOSIS — Z7189 Other specified counseling: Secondary | ICD-10-CM | POA: Diagnosis not present

## 2022-03-19 DIAGNOSIS — Z930 Tracheostomy status: Secondary | ICD-10-CM | POA: Diagnosis not present

## 2022-03-19 DIAGNOSIS — T68XXXA Hypothermia, initial encounter: Principal | ICD-10-CM

## 2022-03-19 DIAGNOSIS — Z825 Family history of asthma and other chronic lower respiratory diseases: Secondary | ICD-10-CM

## 2022-03-19 DIAGNOSIS — T50904A Poisoning by unspecified drugs, medicaments and biological substances, undetermined, initial encounter: Secondary | ICD-10-CM | POA: Diagnosis not present

## 2022-03-19 DIAGNOSIS — R7401 Elevation of levels of liver transaminase levels: Secondary | ICD-10-CM | POA: Diagnosis not present

## 2022-03-19 DIAGNOSIS — I951 Orthostatic hypotension: Secondary | ICD-10-CM | POA: Diagnosis not present

## 2022-03-19 DIAGNOSIS — R000 Tachycardia, unspecified: Secondary | ICD-10-CM | POA: Diagnosis not present

## 2022-03-19 DIAGNOSIS — Z9911 Dependence on respirator [ventilator] status: Secondary | ICD-10-CM

## 2022-03-19 DIAGNOSIS — F410 Panic disorder [episodic paroxysmal anxiety] without agoraphobia: Secondary | ICD-10-CM | POA: Diagnosis present

## 2022-03-19 DIAGNOSIS — Z8249 Family history of ischemic heart disease and other diseases of the circulatory system: Secondary | ICD-10-CM

## 2022-03-19 DIAGNOSIS — R682 Dry mouth, unspecified: Secondary | ICD-10-CM | POA: Diagnosis not present

## 2022-03-19 DIAGNOSIS — R680 Hypothermia, not associated with low environmental temperature: Secondary | ICD-10-CM | POA: Diagnosis present

## 2022-03-19 DIAGNOSIS — J9621 Acute and chronic respiratory failure with hypoxia: Secondary | ICD-10-CM | POA: Diagnosis present

## 2022-03-19 DIAGNOSIS — F4321 Adjustment disorder with depressed mood: Secondary | ICD-10-CM | POA: Diagnosis not present

## 2022-03-19 DIAGNOSIS — E8770 Fluid overload, unspecified: Secondary | ICD-10-CM | POA: Diagnosis not present

## 2022-03-19 DIAGNOSIS — G909 Disorder of the autonomic nervous system, unspecified: Secondary | ICD-10-CM | POA: Diagnosis present

## 2022-03-19 DIAGNOSIS — Z3009 Encounter for other general counseling and advice on contraception: Secondary | ICD-10-CM | POA: Diagnosis not present

## 2022-03-19 DIAGNOSIS — G839 Paralytic syndrome, unspecified: Secondary | ICD-10-CM | POA: Diagnosis not present

## 2022-03-19 DIAGNOSIS — R339 Retention of urine, unspecified: Secondary | ICD-10-CM | POA: Diagnosis not present

## 2022-03-19 DIAGNOSIS — F17210 Nicotine dependence, cigarettes, uncomplicated: Secondary | ICD-10-CM | POA: Diagnosis present

## 2022-03-19 DIAGNOSIS — F329 Major depressive disorder, single episode, unspecified: Secondary | ICD-10-CM | POA: Diagnosis not present

## 2022-03-19 DIAGNOSIS — I10 Essential (primary) hypertension: Secondary | ICD-10-CM | POA: Diagnosis present

## 2022-03-19 DIAGNOSIS — J9602 Acute respiratory failure with hypercapnia: Secondary | ICD-10-CM | POA: Diagnosis not present

## 2022-03-19 DIAGNOSIS — K592 Neurogenic bowel, not elsewhere classified: Secondary | ICD-10-CM | POA: Diagnosis present

## 2022-03-19 DIAGNOSIS — G44209 Tension-type headache, unspecified, not intractable: Secondary | ICD-10-CM | POA: Diagnosis present

## 2022-03-19 DIAGNOSIS — G9519 Other vascular myelopathies: Secondary | ICD-10-CM | POA: Diagnosis not present

## 2022-03-19 DIAGNOSIS — Z793 Long term (current) use of hormonal contraceptives: Secondary | ICD-10-CM

## 2022-03-19 DIAGNOSIS — Z833 Family history of diabetes mellitus: Secondary | ICD-10-CM

## 2022-03-19 DIAGNOSIS — E538 Deficiency of other specified B group vitamins: Secondary | ICD-10-CM | POA: Diagnosis present

## 2022-03-19 DIAGNOSIS — I469 Cardiac arrest, cause unspecified: Secondary | ICD-10-CM | POA: Diagnosis not present

## 2022-03-19 DIAGNOSIS — Z79899 Other long term (current) drug therapy: Secondary | ICD-10-CM

## 2022-03-19 LAB — URINALYSIS, ROUTINE W REFLEX MICROSCOPIC
Bacteria, UA: NONE SEEN
Bilirubin Urine: NEGATIVE
Glucose, UA: NEGATIVE mg/dL
Hgb urine dipstick: NEGATIVE
Ketones, ur: 40 mg/dL — AB
Leukocytes,Ua: NEGATIVE
Nitrite: NEGATIVE
Protein, ur: 30 mg/dL — AB
Specific Gravity, Urine: 1.033 — ABNORMAL HIGH (ref 1.005–1.030)
pH: 6.5 (ref 5.0–8.0)

## 2022-03-19 LAB — I-STAT ARTERIAL BLOOD GAS, ED
Acid-base deficit: 7 mmol/L — ABNORMAL HIGH (ref 0.0–2.0)
Acid-base deficit: 7 mmol/L — ABNORMAL HIGH (ref 0.0–2.0)
Acid-base deficit: 7 mmol/L — ABNORMAL HIGH (ref 0.0–2.0)
Bicarbonate: 24 mmol/L (ref 20.0–28.0)
Bicarbonate: 25.6 mmol/L (ref 20.0–28.0)
Bicarbonate: 23.2 mmol/L (ref 20.0–28.0)
Calcium, Ion: 1.34 mmol/L (ref 1.15–1.40)
Calcium, Ion: 1.26 mmol/L (ref 1.15–1.40)
Calcium, Ion: 1.21 mmol/L (ref 1.15–1.40)
HCT: 43 % (ref 36.0–46.0)
HCT: 41 % (ref 36.0–46.0)
HCT: 38 % (ref 36.0–46.0)
Hemoglobin: 14.6 g/dL (ref 12.0–15.0)
Hemoglobin: 13.9 g/dL (ref 12.0–15.0)
Hemoglobin: 12.9 g/dL (ref 12.0–15.0)
O2 Saturation: 94 %
O2 Saturation: 99 %
O2 Saturation: 58 %
Patient temperature: 88.9
Patient temperature: 89.3
Patient temperature: 89.9
Potassium: 3.8 mmol/L (ref 3.5–5.1)
Potassium: 4 mmol/L (ref 3.5–5.1)
Potassium: 5.1 mmol/L (ref 3.5–5.1)
Sodium: 137 mmol/L (ref 135–145)
Sodium: 139 mmol/L (ref 135–145)
Sodium: 139 mmol/L (ref 135–145)
TCO2: 26 mmol/L (ref 22–32)
TCO2: 28 mmol/L (ref 22–32)
TCO2: 25 mmol/L (ref 22–32)
pCO2 arterial: 56.4 mmHg — ABNORMAL HIGH (ref 32–48)
pCO2 arterial: 75.1 mmHg (ref 32–48)
pCO2 arterial: 58.8 mmHg — ABNORMAL HIGH (ref 32–48)
pH, Arterial: 7.205 — ABNORMAL LOW (ref 7.35–7.45)
pH, Arterial: 7.106 — CL (ref 7.35–7.45)
pH, Arterial: 7.175 — CL (ref 7.35–7.45)
pO2, Arterial: 67 mmHg — ABNORMAL LOW (ref 83–108)
pO2, Arterial: 170 mmHg — ABNORMAL HIGH (ref 83–108)
pO2, Arterial: 29 mmHg — CL (ref 83–108)

## 2022-03-19 LAB — PROTIME-INR
INR: 1 (ref 0.8–1.2)
Prothrombin Time: 12.6 seconds (ref 11.4–15.2)

## 2022-03-19 LAB — RESP PANEL BY RT-PCR (RSV, FLU A&B, COVID)  RVPGX2
Influenza A by PCR: NEGATIVE
Influenza B by PCR: NEGATIVE
Resp Syncytial Virus by PCR: NEGATIVE
SARS Coronavirus 2 by RT PCR: NEGATIVE

## 2022-03-19 LAB — DIFFERENTIAL
Abs Immature Granulocytes: 0.07 10*3/uL (ref 0.00–0.07)
Basophils Absolute: 0.1 10*3/uL (ref 0.0–0.1)
Basophils Relative: 0 %
Eosinophils Absolute: 0.1 10*3/uL (ref 0.0–0.5)
Eosinophils Relative: 0 %
Immature Granulocytes: 0 %
Lymphocytes Relative: 11 %
Lymphs Abs: 2.1 10*3/uL (ref 0.7–4.0)
Monocytes Absolute: 1.1 10*3/uL — ABNORMAL HIGH (ref 0.1–1.0)
Monocytes Relative: 6 %
Neutro Abs: 15 10*3/uL — ABNORMAL HIGH (ref 1.7–7.7)
Neutrophils Relative %: 83 %

## 2022-03-19 LAB — COMPREHENSIVE METABOLIC PANEL
ALT: 14 U/L (ref 0–44)
AST: 14 U/L — ABNORMAL LOW (ref 15–41)
Albumin: 4.6 g/dL (ref 3.5–5.0)
Alkaline Phosphatase: 74 U/L (ref 38–126)
Anion gap: 8 (ref 5–15)
BUN: 7 mg/dL (ref 6–20)
CO2: 22 mmol/L (ref 22–32)
Calcium: 9.7 mg/dL (ref 8.9–10.3)
Chloride: 106 mmol/L (ref 98–111)
Creatinine, Ser: 0.79 mg/dL (ref 0.44–1.00)
GFR, Estimated: >60 mL/min (ref >60)
Glucose, Bld: 113 mg/dL — ABNORMAL HIGH (ref 70–99)
Potassium: 3.9 mmol/L (ref 3.5–5.1)
Sodium: 136 mmol/L (ref 135–145)
Total Bilirubin: 0.6 mg/dL (ref 0.3–1.2)
Total Protein: 7.6 g/dL (ref 6.5–8.1)

## 2022-03-19 LAB — CBC
HCT: 41.5 % (ref 36.0–46.0)
Hemoglobin: 13.8 g/dL (ref 12.0–15.0)
MCH: 28 pg (ref 26.0–34.0)
MCHC: 33.3 g/dL (ref 30.0–36.0)
MCV: 84.3 fL (ref 80.0–100.0)
Platelets: 299 10*3/uL (ref 150–400)
RBC: 4.92 MIL/uL (ref 3.87–5.11)
RDW: 14.3 % (ref 11.5–15.5)
WBC: 18.3 10*3/uL — ABNORMAL HIGH (ref 4.0–10.5)
nRBC: 0 % (ref 0.0–0.2)

## 2022-03-19 LAB — RAPID URINE DRUG SCREEN, HOSP PERFORMED
Amphetamines: NOT DETECTED
Barbiturates: NOT DETECTED
Benzodiazepines: POSITIVE — AB
Cocaine: NOT DETECTED
Opiates: POSITIVE — AB
Tetrahydrocannabinol: POSITIVE — AB

## 2022-03-19 LAB — CBG MONITORING, ED: Glucose-Capillary: 129 mg/dL — ABNORMAL HIGH (ref 70–99)

## 2022-03-19 LAB — PREGNANCY, URINE: Preg Test, Ur: NEGATIVE

## 2022-03-19 LAB — LACTIC ACID, PLASMA: Lactic Acid, Venous: 0.7 mmol/L (ref 0.5–1.9)

## 2022-03-19 LAB — APTT: aPTT: 30 seconds (ref 24–36)

## 2022-03-19 LAB — ETHANOL: Alcohol, Ethyl (B): <10 mg/dL (ref <10)

## 2022-03-19 LAB — TSH: TSH: 2.174 u[IU]/mL (ref 0.350–4.500)

## 2022-03-19 MED ORDER — PROPOFOL 1000 MG/100ML IV EMUL: 0.0000 ug/kg/min | INTRAVENOUS | Status: DC

## 2022-03-19 MED ORDER — SUCCINYLCHOLINE CHLORIDE 200 MG/10ML IV SOSY
PREFILLED_SYRINGE | INTRAVENOUS | Status: AC
  Administered 2022-03-19: 100 mg via INTRAVENOUS
  Filled 2022-03-19: qty 10

## 2022-03-19 MED ORDER — FENTANYL CITRATE PF 50 MCG/ML IJ SOSY: 50.0000 ug | PREFILLED_SYRINGE | Freq: Once | INTRAMUSCULAR | Status: DC

## 2022-03-19 MED ORDER — FENTANYL BOLUS VIA INFUSION
50.0000 ug | INTRAVENOUS | Status: DC | PRN
  Administered 2022-03-19: 50 ug via INTRAVENOUS
  Filled 2022-03-19: qty 100

## 2022-03-19 MED ORDER — ETOMIDATE 2 MG/ML IV SOLN
INTRAVENOUS | Status: AC
  Administered 2022-03-19: 20 mg via INTRAVENOUS
  Filled 2022-03-19: qty 20

## 2022-03-19 MED ORDER — SODIUM CHLORIDE 0.9 % IV SOLN
2.0000 g | Freq: Once | INTRAVENOUS | Status: AC
  Administered 2022-03-19: 2 g via INTRAVENOUS
  Filled 2022-03-19: qty 20

## 2022-03-19 MED ORDER — NALOXONE HCL 2 MG/2ML IJ SOSY: 2.0000 mg | PREFILLED_SYRINGE | Freq: Once | INTRAMUSCULAR | Status: DC

## 2022-03-19 MED ORDER — PROPOFOL 1000 MG/100ML IV EMUL
INTRAVENOUS | Status: AC
  Administered 2022-03-19: 5 ug/kg/min via INTRAVENOUS
  Filled 2022-03-19: qty 100

## 2022-03-19 MED ORDER — ATROPINE SULFATE 1 MG/10ML IJ SOSY
PREFILLED_SYRINGE | INTRAMUSCULAR | Status: AC
  Filled 2022-03-19: qty 10

## 2022-03-19 MED ORDER — ETOMIDATE 2 MG/ML IV SOLN: 20.0000 mg | Freq: Once | INTRAVENOUS | Status: AC

## 2022-03-19 MED ORDER — AZITHROMYCIN 500 MG PO TABS: 500.0000 mg | ORAL_TABLET | Freq: Once | ORAL | Status: DC

## 2022-03-19 MED ORDER — NOREPINEPHRINE 4 MG/250ML-% IV SOLN
INTRAVENOUS | Status: AC
  Filled 2022-03-19: qty 250

## 2022-03-19 MED ORDER — PROPOFOL 1000 MG/100ML IV EMUL
5.0000 ug/kg/min | INTRAVENOUS | Status: DC
  Administered 2022-03-20: 20 ug/kg/min via INTRAVENOUS
  Administered 2022-03-20 – 2022-03-21 (×5): 15 ug/kg/min via INTRAVENOUS
  Filled 2022-03-19: qty 200
  Filled 2022-03-19 (×4): qty 100

## 2022-03-19 MED ORDER — NALOXONE HCL 2 MG/2ML IJ SOSY
PREFILLED_SYRINGE | INTRAMUSCULAR | Status: AC
  Filled 2022-03-19: qty 2

## 2022-03-19 MED ORDER — ROCURONIUM BROMIDE 10 MG/ML (PF) SYRINGE
PREFILLED_SYRINGE | INTRAVENOUS | Status: AC
  Filled 2022-03-19: qty 10

## 2022-03-19 MED ORDER — NOREPINEPHRINE 4 MG/250ML-% IV SOLN
2.0000 ug/min | INTRAVENOUS | Status: DC
  Administered 2022-03-19: 2 ug/min via INTRAVENOUS
  Filled 2022-03-19: qty 250

## 2022-03-19 MED ORDER — SODIUM CHLORIDE 0.9 % IV BOLUS
1000.0000 mL | Freq: Once | INTRAVENOUS | Status: AC
  Administered 2022-03-19: 1000 mL via INTRAVENOUS

## 2022-03-19 MED ORDER — SODIUM CHLORIDE 0.9 % IV BOLUS
2000.0000 mL | Freq: Once | INTRAVENOUS | Status: AC
  Administered 2022-03-19: 2000 mL via INTRAVENOUS

## 2022-03-19 MED ORDER — FENTANYL 2500MCG IN NS 250ML (10MCG/ML) PREMIX INFUSION
50.0000 ug/h | INTRAVENOUS | Status: DC
  Administered 2022-03-19: 50 ug/h via INTRAVENOUS
  Administered 2022-03-20: 100 ug/h via INTRAVENOUS
  Administered 2022-03-21: 50 ug/h via INTRAVENOUS
  Filled 2022-03-19 (×4): qty 250

## 2022-03-19 MED ORDER — INSULIN ASPART 100 UNIT/ML IJ SOLN
0.0000 [IU] | INTRAMUSCULAR | Status: DC
  Administered 2022-03-20 – 2022-03-22 (×8): 1 [IU] via SUBCUTANEOUS
  Administered 2022-03-22: 5 [IU] via SUBCUTANEOUS
  Administered 2022-03-22 – 2022-03-23 (×3): 1 [IU] via SUBCUTANEOUS

## 2022-03-19 MED ORDER — SUCCINYLCHOLINE CHLORIDE 200 MG/10ML IV SOSY: 100.0000 mg | PREFILLED_SYRINGE | Freq: Once | INTRAVENOUS | Status: AC

## 2022-03-19 MED ORDER — FENTANYL CITRATE PF 50 MCG/ML IJ SOSY
50.0000 ug | PREFILLED_SYRINGE | INTRAMUSCULAR | Status: DC | PRN
  Administered 2022-03-19 (×2): 50 ug via INTRAVENOUS
  Filled 2022-03-19 (×2): qty 1

## 2022-03-19 MED ORDER — SODIUM CHLORIDE 0.9 % IV BOLUS
500.0000 mL | Freq: Once | INTRAVENOUS | Status: AC
  Administered 2022-03-19: 500 mL via INTRAVENOUS

## 2022-03-19 MED ORDER — SODIUM CHLORIDE 0.9 % IV SOLN
250.0000 mL | INTRAVENOUS | Status: DC
  Administered 2022-03-25 – 2022-04-07 (×4): 250 mL via INTRAVENOUS

## 2022-03-19 MED ORDER — DOCUSATE SODIUM 50 MG/5ML PO LIQD
100.0000 mg | Freq: Two times a day (BID) | ORAL | Status: DC
  Administered 2022-03-20 – 2022-03-21 (×4): 100 mg
  Filled 2022-03-19 (×4): qty 10

## 2022-03-19 MED ORDER — FAMOTIDINE 20 MG PO TABS: 20.0000 mg | ORAL_TABLET | Freq: Two times a day (BID) | ORAL | Status: DC

## 2022-03-19 MED ORDER — POLYETHYLENE GLYCOL 3350 17 G PO PACK
17.0000 g | PACK | Freq: Every day | ORAL | Status: DC
  Administered 2022-03-20 – 2022-03-21 (×2): 17 g
  Filled 2022-03-19 (×2): qty 1

## 2022-03-19 MED ORDER — CHLORHEXIDINE GLUCONATE CLOTH 2 % EX PADS
6.0000 | MEDICATED_PAD | Freq: Every day | CUTANEOUS | Status: DC
  Administered 2022-03-20 – 2022-04-18 (×29): 6 via TOPICAL

## 2022-03-19 MED ORDER — SODIUM CHLORIDE 0.9 % IV SOLN
100.0000 mL/h | INTRAVENOUS | Status: DC
  Administered 2022-03-20 – 2022-03-21 (×3): 100 mL/h via INTRAVENOUS

## 2022-03-19 MED ORDER — FAMOTIDINE 20 MG PO TABS
20.0000 mg | ORAL_TABLET | Freq: Two times a day (BID) | ORAL | Status: DC
  Administered 2022-03-20 – 2022-04-17 (×55): 20 mg
  Filled 2022-03-19 (×56): qty 1

## 2022-03-19 MED ORDER — FENTANYL CITRATE PF 50 MCG/ML IJ SOSY: 50.0000 ug | PREFILLED_SYRINGE | INTRAMUSCULAR | Status: DC | PRN

## 2022-03-19 MED ORDER — NALOXONE HCL 2 MG/2ML IJ SOSY
2.0000 mg | PREFILLED_SYRINGE | Freq: Once | INTRAMUSCULAR | Status: AC
  Administered 2022-03-19: 2 mg via INTRAVENOUS
  Filled 2022-03-19: qty 2

## 2022-03-19 NOTE — ED Triage Notes (Signed)
Present to triage in [facility].  Stating can't breath.  Weak and floppy in WC.  Cold and pale.  States had headache and back pain and took pain medication.  Not able to move arms legs.

## 2022-03-19 NOTE — ED Provider Notes (Addendum)
[facility] Provider Note   CSN: [id number] Arrival date & time: 03/05/2022  1807     History  Chief Complaint  Patient presents with   Weakness    Rachael [name] is a 22 y.o. female.   Weakness    Patient denies any history of any medical problems.  She presents to the ED with complaints of acute weakness difficulty breathing.  Patient states she initially started having trouble with headache and back pain.  She took a hydrocodone that she had at home.  Patient states she is not able to move arms or legs now.  She denies any fevers.  She denies any chest pain.  No abdominal pain.  Patient denies any previous medical issues  Medical records indicate a history of major depressive disorder, suicidal ideation pyelonephritis.  Home Medications Prior to Admission medications   Medication Sig Start Date End Date Taking? Authorizing Provider  acetaminophen (TYLENOL) 325 MG tablet Take 2 tablets (650 mg total) by mouth every 4 (four) hours as needed (for pain scale < 4). 11/18/19   Janet Berlin, MD  cetirizine (ZYRTEC ALLERGY) 10 MG tablet Take 1 tablet (10 mg total) by mouth daily. 11/18/21   Jaynee Eagles, PA-C  docusate sodium (COLACE) 250 MG capsule Take 1 capsule (250 mg total) by mouth daily. Patient taking differently: Take 250 mg by mouth daily as needed for constipation.  06/23/19   Wende Mott, CNM  ferrous sulfate (FERROUSUL) 325 (65 FE) MG tablet Take 1 tablet (325 mg total) by mouth every other day. 09/13/19   Leftwich-Kirby, Kathie Dike, CNM  ibuprofen (ADVIL) 800 MG tablet Take 1 tablet (800 mg total) by mouth every 8 (eight) hours as needed for up to 21 doses for fever, headache, mild pain or moderate pain. 04/08/21   Lynden Oxford Scales, PA-C  ondansetron (ZOFRAN ODT) 4 MG disintegrating tablet Take 1 tablet (4 mg total) by mouth every 8 (eight) hours as needed for nausea or vomiting. 06/23/19   Wende Mott, CNM   predniSONE (DELTASONE) 20 MG tablet Take 2 tablets daily with breakfast. 11/18/21   Jaynee Eagles, PA-C  Prenat-FeCbn-FeAsp-Meth-FA-DHA (PRENATE MINI) 18-0.6-0.4-350 MG CAPS Take 1 tablet by mouth daily. 09/13/19   Leftwich-Kirby, Kathie Dike, CNM  Prenatal Vit-Fe Phos-FA-Omega (VITAFOL GUMMIES) 3.33-0.333-34.8 MG CHEW Chew 3 each by mouth daily. Patient not taking: Reported on 11/18/2019 05/21/19   Constant, Peggy, MD  promethazine-dextromethorphan (PROMETHAZINE-DM) 6.25-15 MG/5ML syrup Take 2.5 mLs by mouth 3 (three) times daily as needed for cough. 11/18/21   Jaynee Eagles, PA-C      Allergies    Patient has no known allergies.    Review of Systems   Review of Systems  Neurological:  Positive for weakness.    Physical Exam Updated Vital Signs BP (!) 88/49   Pulse (!) 49   Temp (!) 89.6 F (32 C)   Resp 15   Ht 1.702 m (5' 7"$ )   Wt 90.7 kg   SpO2 100%   BMI 31.32 kg/m  Physical Exam Vitals and nursing note reviewed.  Constitutional:      Appearance: She is well-developed. She is ill-appearing.  HENT:     Head: Normocephalic and atraumatic.     Right Ear: External ear normal.     Left Ear: External ear normal.  Eyes:     General: No scleral icterus.       Right eye: No discharge.  Left eye: No discharge.     Conjunctiva/sclera: Conjunctivae normal.  Neck:     Trachea: No tracheal deviation.  Cardiovascular:     Rate and Rhythm: Normal rate and regular rhythm.  Pulmonary:     Effort: Pulmonary effort is normal. No respiratory distress.     Breath sounds: Normal breath sounds. No stridor. No wheezing or rales.  Abdominal:     General: Bowel sounds are normal. There is no distension.     Palpations: Abdomen is soft.     Tenderness: There is no abdominal tenderness. There is no guarding or rebound.  Musculoskeletal:        General: No tenderness or deformity.     Cervical back: Neck supple.  Skin:    General: Skin is warm and dry.     Coloration: Skin is pale.      Findings: No rash.  Neurological:     Mental Status: She is alert.     Cranial Nerves: No cranial nerve deficit, dysarthria or facial asymmetry.     Sensory: No sensory deficit.     Motor: Weakness and abnormal muscle tone present. No seizure activity.     Comments: No facial droop, patient does move her right arm but otherwise is not moving her left upper extremity or bilateral lower extremities  Psychiatric:        Mood and Affect: Mood normal.     ED Results / Procedures / Treatments   Labs (all labs ordered are listed, but only abnormal results are displayed) Labs Reviewed  CBC - Abnormal; Notable for the following components:      Result Value   WBC 18.3 (*)    All other components within normal limits  DIFFERENTIAL - Abnormal; Notable for the following components:   Neutro Abs 15.0 (*)    Monocytes Absolute 1.1 (*)    All other components within normal limits  COMPREHENSIVE METABOLIC PANEL - Abnormal; Notable for the following components:   Glucose, Bld 113 (*)    AST 14 (*)    All other components within normal limits  RAPID URINE DRUG SCREEN, HOSP PERFORMED - Abnormal; Notable for the following components:   Opiates POSITIVE (*)    Benzodiazepines POSITIVE (*)    Tetrahydrocannabinol POSITIVE (*)    All other components within normal limits  URINALYSIS, ROUTINE W REFLEX MICROSCOPIC - Abnormal; Notable for the following components:   Specific Gravity, Urine 1.033 (*)    Ketones, ur 40 (*)    Protein, ur 30 (*)    All other components within normal limits  CBG MONITORING, ED - Abnormal; Notable for the following components:   Glucose-Capillary 129 (*)    All other components within normal limits  I-STAT ARTERIAL BLOOD GAS, ED - Abnormal; Notable for the following components:   pH, Arterial 7.205 (*)    pCO2 arterial 56.4 (*)    pO2, Arterial 67 (*)    Acid-base deficit 7.0 (*)    All other components within normal limits  I-STAT ARTERIAL BLOOD GAS, ED - Abnormal;  Notable for the following components:   pH, Arterial 7.106 (*)    pCO2 arterial 75.1 (*)    pO2, Arterial 170 (*)    Acid-base deficit 7.0 (*)    All other components within normal limits  RESP PANEL BY RT-PCR (RSV, FLU A&B, COVID)  RVPGX2  CULTURE, BLOOD (ROUTINE X 2)  CULTURE, BLOOD (ROUTINE X 2)  ETHANOL  PROTIME-INR  APTT  PREGNANCY, URINE  TSH  LACTIC ACID, PLASMA  T4, FREE  LACTIC ACID, PLASMA  AMMONIA  ACETAMINOPHEN LEVEL  SALICYLATE LEVEL  TRIGLYCERIDES  TRIGLYCERIDES    EKG EKG Interpretation  Date/Time:  Friday March 19 2022 18:18:56 EST Ventricular Rate:  58 PR Interval:  178 QRS Duration: 106 QT Interval:  494 QTC Calculation: 486 R Axis:   59 Text Interpretation: Sinus rhythm ST elev, probable normal early repol pattern Borderline prolonged QT interval Baseline wander in lead(s) V2 No old tracing to compare Confirmed by Dorie Rank [phone]) on 03/14/2022 6:24:01 PM  Radiology DG Chest Portable 1 View  Result Date: 03/18/2022 CLINICAL DATA:  Post intubation. EXAM: PORTABLE CHEST 1 VIEW COMPARISON:  03/23/2022. FINDINGS: Heart is enlarged and the mediastinal contour is within normal limits. No consolidation, effusion, or pneumothorax. A soft tissue density is present over the left lung field, likely reflecting breast tissue. The endotracheal tube terminates 5.1 cm above the carina. An enteric tube terminates in the mid esophagus and should be advanced approximately 26 cm. IMPRESSION: 1. No active disease. 2. An enteric tube terminates in the proximal to mid esophagus and should be advanced 26 cm. 3. Endotracheal tube terminates 5.1 cm above the carina. Electronically Signed   By: Brett Fairy M.D.   On: 03/05/2022 20:57   DG Chest Portable 1 View  Result Date: 03/14/2022 CLINICAL DATA:  Cough weakness EXAM: PORTABLE CHEST 1 VIEW COMPARISON:  None Available. FINDINGS: Hazy opacity at the left base. Right lung is clear. Normal cardiac size. No pneumothorax  IMPRESSION: Hazy opacity at the left base may reflect atelectasis or mild pneumonia. Electronically Signed   By: Donavan Foil M.D.   On: 03/31/2022 18:56   CT HEAD WO CONTRAST  Result Date: 03/11/2022 CLINICAL DATA:  Weakness headache unable to move arms and legs EXAM: CT HEAD WITHOUT CONTRAST TECHNIQUE: Contiguous axial images were obtained from the base of the skull through the vertex without intravenous contrast. RADIATION DOSE REDUCTION: This exam was performed according to the departmental dose-optimization program which includes automated exposure control, adjustment of the mA and/or kV according to patient size and/or use of iterative reconstruction technique. COMPARISON:  None Available. FINDINGS: Brain: No evidence of acute infarction, hemorrhage, hydrocephalus, extra-axial collection or mass lesion/mass effect. Vascular: No hyperdense vessel or unexpected calcification. Skull: Normal. Negative for fracture or focal lesion. Sinuses/Orbits: No acute finding. Other: None IMPRESSION: Negative non contrasted CT appearance of the brain. Electronically Signed   By: Donavan Foil M.D.   On: 03/22/2022 18:55    Procedures .Critical Care  Performed by: Dorie Rank, MD Authorized by: Dorie Rank, MD   Critical care provider statement:    Critical care time (minutes):  60   Critical care was time spent personally by me on the following activities:  Development of treatment plan with patient or surrogate, discussions with consultants, evaluation of patient's response to treatment, examination of patient, ordering and review of laboratory studies, ordering and review of radiographic studies, ordering and performing treatments and interventions, pulse oximetry, re-evaluation of patient's condition and review of old charts Date/Time: 03/22/2022 8:35 PM  Performed by: Dorie Rank, MDPreoxygenation: Pre-oxygenation with 100% oxygen Induction Type: Rapid sequence Laryngoscope Size: Glidescope Grade View:  Grade II Tube size: 7.5 mm Number of attempts: 1 Airway Equipment and Method: Video-laryngoscopy Secured at: 20 cm Tube secured with: ETT holder Dental Injury: Teeth and Oropharynx as per pre-operative assessment         Medications Ordered in ED Medications  sodium chloride 0.9 %  bolus 500 mL (0 mLs Intravenous Stopped 03/10/2022 2015)    Followed by  0.9 %  sodium chloride infusion (100 mL/hr Intravenous Rate/Dose Change 03/30/2022 2015)  azithromycin (ZITHROMAX) tablet 500 mg (500 mg Oral Not Given 03/24/2022 1915)  naloxone [facility]) 2 MG/2ML injection (has no administration in time range)  naloxone [facility]) 2 MG/2ML injection (has no administration in time range)  fentaNYL (SUBLIMAZE) injection 50 mcg (50 mcg Intravenous Given 03/24/2022 2100)  fentaNYL (SUBLIMAZE) injection 50-200 mcg (has no administration in time range)  propofol (DIPRIVAN) 1000 MG/100ML infusion (0 mcg/kg/min  90.7 kg Intravenous Stopped 03/07/2022 2133)  sodium chloride 0.9 % bolus 2,000 mL (has no administration in time range)  cefTRIAXone (ROCEPHIN) 2 g in sodium chloride 0.9 % 100 mL IVPB (0 g Intravenous Stopped 03/09/2022 2045)  naloxone [facility]) injection 2 mg (2 mg Intravenous Given 03/12/2022 1933)  etomidate (AMIDATE) injection 20 mg (20 mg Intravenous Given 03/25/2022 2029)  succinylcholine (ANECTINE) syringe 100 mg (100 mg Intravenous Given 03/07/2022 2030)  sodium chloride 0.9 % bolus 1,000 mL (1,000 mLs Intravenous New Bag/Given 03/11/2022 2059)    ED Course/ Medical Decision Making/ A&P Clinical Course as of 03/24/2022 2201  Fri Mar 19, 2022  1837 Leukocytosis noted.  Initial temp decreased at 92. [JK]  1902 Comprehensive metabolic panel(!) Metabolic panel shows no significant electrolyte abnormalities.  White blood cell count does show leukocytosis. [JK]  1902 CT HEAD WO CONTRAST Head CT without acute abnormalities [JK]  1903 DG Chest Portable 1 View Chest x-ray does show hazy opacities suggestive of possible  pneumonia [JK]  1927 Pt now not speaking.  Just mouthing words.   [JK]  1952 Patient had's persistent respiratory acidosis.  No response to Narcan.  She is on a nonrebreather.  Patient still mouthing words but will not speak to me.  Patient also is saying she cannot move, she would be at risk for aspiration with BiPAP.  With her acidosis and hypoxia will proceed with intubation.  Continue with warming. [JK]  2009 Patient still mouthing words.  She is alert and trying to ask me questions but I am unable to lip read so I can understand what she is trying to ask me. [JK]  2009 UDS is positive for opiates benzodiazepines as well as THC [JK]  2010 Will repeat blood gas as her oxygen saturation looks good her vitals look good.  Considering intubation but will repeat ABG to see if it is improving and if we could continue with supportive treatment [JK]  2024 Blood gas shows worsening pH and pCO2.  Will proceed with intubation [JK]  2049 DIscussed with Dr Patsey Berthold, regarding admission [JK]  2134 BP decreasing.  Will hold proprofol.  Give additional fluid bolus [JK]  2201 Blood pressure dropping when she is on propofol.  Tried to hold the propofol but the patient then is awake and alert and biting on the tube.  Will start her on Levophed to maintain blood pressure while continuing the propofol and fentanyl [JK]    Clinical Course User Index [JK] Dorie Rank, MD                             Medical Decision Making Differential diagnosis includes but not limited to acute infection, hypothyroidism, cerebral hemorrhage, hypokalemia, metabolic disorder, drug reaction, functional disorder  Problems Addressed: Hypothermia, initial encounter: acute illness or injury that poses a threat to life or bodily functions Overdose of undetermined intent, initial  encounter: acute illness or injury that poses a threat to life or bodily functions Respiratory acidosis: acute illness or injury that poses a threat to life or  bodily functions  Amount and/or Complexity of Data Reviewed Labs: ordered. Decision-making details documented in ED Course. Radiology: ordered and independent interpretation performed. Decision-making details documented in ED Course.  Risk Prescription drug management. Decision regarding hospitalization.   Patient presented to the ED with complaints of weakness, inability to move her extremities.  Initially patient was able to speak to me but was not able to tell me what happened.  No additional information was provided by the people that brought her in.  Patient's laboratory tests were notable for leukocytosis.  Her blood gas showed evidence of respiratory acidosis.  Patient's mental status decreased.  Patient was mouthing words and trying to whisper to me but I was unable to understand her.  She was still alert and looking at me her mental status was concerning.  Her respiratory acidosis worsened.  She was given Narcan but there was not much improvement.  Patient intubated for airway protection and for her respiratory acidosis without difficulty.  Suspect this may have been an overdose and polypharmacy in the setting of pna, infection.  Acute neurologic dysfunction such as guiliian barre also a consideration.  I have added on salicylate and lactic acid levels.  No signs of acute renal failure at this time.  No cerebral hemorrhage on CT.  I will consult with critical care to arrange for transfer to hospital admission.    Final Clinical Impression(s) / ED Diagnoses Final diagnoses:  Hypothermia, initial encounter  Overdose of undetermined intent, initial encounter  Respiratory acidosis  Community acquired pneumonia, unspecified laterality    Rx / DC Orders ED Discharge Orders     None          Dorie Rank, MD 03/24/2022 2245

## 2022-03-19 NOTE — ED Notes (Signed)
Dr Tomi Bamberger at bedside.  Patient unable to sign MSE at this time

## 2022-03-19 NOTE — ED Notes (Signed)
Replaced OG due to CXR result. MD aware.

## 2022-03-19 NOTE — ED Notes (Signed)
[facility] at bedside for transport. Report given to transport team.

## 2022-03-19 NOTE — ED Notes (Signed)
Patient remains on barehugger with multiple blankets. MD aware, no new orders received.

## 2022-03-19 NOTE — ED Notes (Signed)
RT obtained ABG on pt with the following results. All critical values reported to MD [facility]. Pt at time of ABG on NRB 15 Lpm. Pt respiratory status stable w/no distress, pt has no gag when suctioned with variable amounts of secretions at this time. RT will continue to monitor while at [facility].

## 2022-03-19 NOTE — ED Notes (Signed)
Patient refusing rectal temp at this time

## 2022-03-19 NOTE — ED Notes (Addendum)
MD at bedside. Patient remains alert to verbal stimuli. Attempting to communicate with staff, pt unable to speak but is mouthing words. Unable to move extremities.

## 2022-03-19 NOTE — ED Notes (Signed)
MD at bedside. Updated on pt blood pressure and RASS. VO received for IVF bolus and to give dose of fentanyl. See MAR.

## 2022-03-19 NOTE — ED Notes (Signed)
HR dropped to 38. Dr. Tomi Bamberger notified and at bedside. VO, RB &V to stop propofol.

## 2022-03-19 NOTE — ED Notes (Signed)
Moved to resus room at this time on portable monitor, oxygen, with IVF via stretcher. Pt is alert to verbal stimuli.

## 2022-03-19 NOTE — ED Notes (Signed)
RT remains at beside, completing ABG per MD order.

## 2022-03-19 NOTE — ED Notes (Signed)
RT called report to receiving RT [facility].

## 2022-03-19 NOTE — ED Notes (Signed)
RT obtained ABG post intubation with the following results and critical values reported to MD [facility]. Pt settings PRVC 490/20/+5/100%. RT will continue to monitor while at [facility].    Latest Reference Range & Units 03/29/2022 22:10  Sample type  ARTERIAL  pH, Arterial 7.35 - 7.45  7.175 (LL)  pCO2 arterial 32 - 48 mmHg 58.8 (H)  pO2, Arterial 83 - 108 mmHg 29 (LL)  TCO2 22 - 32 mmol/L 25  Acid-base deficit 0.0 - 2.0 mmol/L 7.0 (H)  Bicarbonate 20.0 - 28.0 mmol/L 23.2  O2 Saturation % 58  Patient temperature  89.9 F  Collection site  RADIAL, ALLEN'S TEST ACCEPTABLE  (LL): Data is critically low (H): Data is abnormally high

## 2022-03-19 NOTE — ED Notes (Signed)
MD at bedside. 

## 2022-03-19 NOTE — ED Notes (Signed)
MD at bedside. Instructed to restart propofol. See MAR.

## 2022-03-19 NOTE — ED Notes (Signed)
Report given to Janett Billow, [place] for [phone]

## 2022-03-19 NOTE — ED Notes (Signed)
MD at bedside. At present pt is alert to verbal stimuli.

## 2022-03-19 NOTE — Procedures (Signed)
Intubation Procedure Note  Rachelmarie Gaffke  [id number]  17-Sep-2000  Date:03/20/2022  Time:9:02 PM   Provider Performing:Jon Tomi Bamberger, MD    Procedure: Intubation ([id number])  Indication(s) Respiratory Failure  Consent Unable to obtain consent due to emergent nature of procedure.   Anesthesia    Time Out Verified patient identification, verified procedure, site/side was marked, verified correct patient position, special equipment/implants available, medications/allergies/relevant history reviewed, required imaging and test results available.   Sterile Technique Usual hand hygeine, masks, and gloves were used   Procedure Description Patient positioned in bed supine.  Sedation given as noted above.  Patient was intubated with endotracheal tube using Glidescope.  View was Grade 1 full glottis .  Number of attempts was 1.  Colorimetric CO2 detector was consistent with tracheal placement.   Complications/Tolerance None; patient tolerated the procedure well. Chest X-ray is ordered to verify placement.   EBL    Specimen(s) None RT assisted MD with intubation of patient. 7.5 ETT placed 21 @ lip. BLBS, ETCO2 color change, chest rise, CXR confirmed placement. Pt suctioned and placed on ventilator.

## 2022-03-19 NOTE — ED Notes (Signed)
[name], RN attempted to reinsert NG/OG unsucessful. Dr. Tomi Bamberger notified. No additional orders received at this time.

## 2022-03-19 NOTE — ED Notes (Addendum)
RT obtained ABG per MD order with the following results and critical values. MD notified of critical pH and PCO2. Pt currently on NRB 15 Lpm w/ sats of 97%. RT will continue to monitor while at [facility].    Latest Reference Range & Units 03/17/2022 20:19  Sample type  ARTERIAL  pH, Arterial 7.35 - 7.45  7.106 (LL)  pCO2 arterial 32 - 48 mmHg 75.1 (HH)  pO2, Arterial 83 - 108 mmHg 170 (H)  TCO2 22 - 32 mmol/L 28  Acid-base deficit 0.0 - 2.0 mmol/L 7.0 (H)  Bicarbonate 20.0 - 28.0 mmol/L 25.6  O2 Saturation % 99  Patient temperature  89.3 F  Collection site  RADIAL, ALLEN'S TEST ACCEPTABLE  (LL): Data is critically low [facility]): Data is critically high (H): Data is abnormally high

## 2022-03-20 ENCOUNTER — Inpatient Hospital Stay (HOSPITAL_COMMUNITY): Payer: Medicaid Other

## 2022-03-20 DIAGNOSIS — F1010 Alcohol abuse, uncomplicated: Secondary | ICD-10-CM

## 2022-03-20 DIAGNOSIS — G9519 Other vascular myelopathies: Secondary | ICD-10-CM | POA: Diagnosis not present

## 2022-03-20 DIAGNOSIS — F17200 Nicotine dependence, unspecified, uncomplicated: Secondary | ICD-10-CM

## 2022-03-20 DIAGNOSIS — F1210 Cannabis abuse, uncomplicated: Secondary | ICD-10-CM

## 2022-03-20 DIAGNOSIS — J9601 Acute respiratory failure with hypoxia: Secondary | ICD-10-CM

## 2022-03-20 DIAGNOSIS — I6339 Cerebral infarction due to thrombosis of other cerebral artery: Secondary | ICD-10-CM

## 2022-03-20 DIAGNOSIS — G9511 Acute infarction of spinal cord (embolic) (nonembolic): Principal | ICD-10-CM

## 2022-03-20 DIAGNOSIS — Z9911 Dependence on respirator [ventilator] status: Secondary | ICD-10-CM | POA: Diagnosis not present

## 2022-03-20 DIAGNOSIS — J9602 Acute respiratory failure with hypercapnia: Secondary | ICD-10-CM

## 2022-03-20 DIAGNOSIS — I6389 Other cerebral infarction: Secondary | ICD-10-CM

## 2022-03-20 DIAGNOSIS — G839 Paralytic syndrome, unspecified: Secondary | ICD-10-CM

## 2022-03-20 LAB — POCT I-STAT 7, (LYTES, BLD GAS, ICA,H+H)
Acid-base deficit: 8 mmol/L — ABNORMAL HIGH (ref 0.0–2.0)
Bicarbonate: 18.3 mmol/L — ABNORMAL LOW (ref 20.0–28.0)
Calcium, Ion: 1.14 mmol/L — ABNORMAL LOW (ref 1.15–1.40)
HCT: 36 % (ref 36.0–46.0)
Hemoglobin: 12.2 g/dL (ref 12.0–15.0)
O2 Saturation: 100 %
Patient temperature: 34.4
Potassium: 4.5 mmol/L (ref 3.5–5.1)
Sodium: 136 mmol/L (ref 135–145)
TCO2: 19 mmol/L — ABNORMAL LOW (ref 22–32)
pCO2 arterial: 33.7 mmHg (ref 32–48)
pH, Arterial: 7.329 — ABNORMAL LOW (ref 7.35–7.45)
pO2, Arterial: 301 mmHg — ABNORMAL HIGH (ref 83–108)

## 2022-03-20 LAB — SALICYLATE LEVEL: Salicylate Lvl: <7 mg/dL — ABNORMAL LOW (ref 7.0–30.0)

## 2022-03-20 LAB — BASIC METABOLIC PANEL
Anion gap: 8 (ref 5–15)
BUN: <5 mg/dL — ABNORMAL LOW (ref 6–20)
CO2: 20 mmol/L — ABNORMAL LOW (ref 22–32)
Calcium: 8 mg/dL — ABNORMAL LOW (ref 8.9–10.3)
Chloride: 110 mmol/L (ref 98–111)
Creatinine, Ser: 0.76 mg/dL (ref 0.44–1.00)
GFR, Estimated: >60 mL/min (ref >60)
Glucose, Bld: 102 mg/dL — ABNORMAL HIGH (ref 70–99)
Potassium: 3.8 mmol/L (ref 3.5–5.1)
Sodium: 138 mmol/L (ref 135–145)

## 2022-03-20 LAB — CBC
HCT: 34.9 % — ABNORMAL LOW (ref 36.0–46.0)
Hemoglobin: 11.1 g/dL — ABNORMAL LOW (ref 12.0–15.0)
MCH: 27.8 pg (ref 26.0–34.0)
MCHC: 31.8 g/dL (ref 30.0–36.0)
MCV: 87.5 fL (ref 80.0–100.0)
Platelets: 221 10*3/uL (ref 150–400)
RBC: 3.99 MIL/uL (ref 3.87–5.11)
RDW: 14.3 % (ref 11.5–15.5)
WBC: 13.5 10*3/uL — ABNORMAL HIGH (ref 4.0–10.5)
nRBC: 0 % (ref 0.0–0.2)

## 2022-03-20 LAB — ECHOCARDIOGRAM COMPLETE
Area-P 1/2: 2.91 cm2
Height: 67 in
S' Lateral: 2.6 cm
Weight: 2606.72 oz

## 2022-03-20 LAB — GLUCOSE, CAPILLARY
Glucose-Capillary: 101 mg/dL — ABNORMAL HIGH (ref 70–99)
Glucose-Capillary: 116 mg/dL — ABNORMAL HIGH (ref 70–99)
Glucose-Capillary: 117 mg/dL — ABNORMAL HIGH (ref 70–99)
Glucose-Capillary: 121 mg/dL — ABNORMAL HIGH (ref 70–99)
Glucose-Capillary: 122 mg/dL — ABNORMAL HIGH (ref 70–99)
Glucose-Capillary: 126 mg/dL — ABNORMAL HIGH (ref 70–99)
Glucose-Capillary: 93 mg/dL (ref 70–99)

## 2022-03-20 LAB — HEMOGLOBIN A1C
Hgb A1c MFr Bld: 5.3 % (ref 4.8–5.6)
Mean Plasma Glucose: 105.41 mg/dL

## 2022-03-20 LAB — PROCALCITONIN: Procalcitonin: <0.1 ng/mL

## 2022-03-20 LAB — LACTIC ACID, PLASMA: Lactic Acid, Venous: 1.8 mmol/L (ref 0.5–1.9)

## 2022-03-20 LAB — TRIGLYCERIDES: Triglycerides: 140 mg/dL (ref <150)

## 2022-03-20 LAB — HIV ANTIBODY (ROUTINE TESTING W REFLEX): HIV Screen 4th Generation wRfx: NONREACTIVE

## 2022-03-20 LAB — MAGNESIUM: Magnesium: 1.8 mg/dL (ref 1.7–2.4)

## 2022-03-20 LAB — LIPID PANEL
Cholesterol: 176 mg/dL (ref 0–200)
HDL: 37 mg/dL — ABNORMAL LOW (ref >40)
LDL Cholesterol: 111 mg/dL — ABNORMAL HIGH (ref 0–99)
Total CHOL/HDL Ratio: 4.8 RATIO
Triglycerides: 138 mg/dL (ref <150)
VLDL: 28 mg/dL (ref 0–40)

## 2022-03-20 LAB — T4, FREE: Free T4: 0.78 ng/dL (ref 0.61–1.12)

## 2022-03-20 LAB — FOLATE: Folate: 8.2 ng/mL (ref >5.9)

## 2022-03-20 LAB — VITAMIN B12: Vitamin B-12: 193 pg/mL (ref 180–914)

## 2022-03-20 LAB — PHOSPHORUS: Phosphorus: 3.7 mg/dL (ref 2.5–4.6)

## 2022-03-20 LAB — ACETAMINOPHEN LEVEL: Acetaminophen (Tylenol), Serum: <10 ug/mL — ABNORMAL LOW (ref 10–30)

## 2022-03-20 LAB — C-REACTIVE PROTEIN: CRP: 0.7 mg/dL (ref <1.0)

## 2022-03-20 LAB — SEDIMENTATION RATE: Sed Rate: 3 mm/hr (ref 0–22)

## 2022-03-20 LAB — MRSA NEXT GEN BY PCR, NASAL: MRSA by PCR Next Gen: NOT DETECTED

## 2022-03-20 LAB — CORTISOL: Cortisol, Plasma: 23.9 ug/dL

## 2022-03-20 MED ORDER — VITAL HIGH PROTEIN PO LIQD
1000.0000 mL | ORAL | Status: AC
  Administered 2022-03-20: 1000 mL

## 2022-03-20 MED ORDER — CYANOCOBALAMIN 1000 MCG/ML IJ SOLN
1000.0000 ug | Freq: Once | INTRAMUSCULAR | Status: AC
  Administered 2022-03-20: 1000 ug via INTRAMUSCULAR
  Filled 2022-03-20: qty 1

## 2022-03-20 MED ORDER — ORAL CARE MOUTH RINSE
15.0000 mL | OROMUCOSAL | Status: DC
  Administered 2022-03-20 – 2022-04-07 (×145): 15 mL via OROMUCOSAL

## 2022-03-20 MED ORDER — THIAMINE MONONITRATE 100 MG PO TABS
100.0000 mg | ORAL_TABLET | Freq: Every day | ORAL | Status: DC
  Administered 2022-03-20 – 2022-04-14 (×25): 100 mg
  Filled 2022-03-20 (×27): qty 1

## 2022-03-20 MED ORDER — ASPIRIN 300 MG RE SUPP: 300.0000 mg | Freq: Every day | RECTAL | Status: DC

## 2022-03-20 MED ORDER — ASPIRIN 81 MG PO CHEW: 81.0000 mg | CHEWABLE_TABLET | Freq: Every day | ORAL | Status: DC

## 2022-03-20 MED ORDER — SODIUM CHLORIDE 0.9 % IV BOLUS
1000.0000 mL | Freq: Once | INTRAVENOUS | Status: AC
  Administered 2022-03-20: 1000 mL via INTRAVENOUS

## 2022-03-20 MED ORDER — ORAL CARE MOUTH RINSE: 15.0000 mL | OROMUCOSAL | Status: DC | PRN

## 2022-03-20 MED ORDER — ROSUVASTATIN CALCIUM 20 MG PO TABS
20.0000 mg | ORAL_TABLET | Freq: Every day | ORAL | Status: DC
  Filled 2022-03-20: qty 1

## 2022-03-20 MED ORDER — PROSOURCE TF20 ENFIT COMPATIBL EN LIQD
60.0000 mL | Freq: Two times a day (BID) | ENTERAL | Status: DC
  Administered 2022-03-20 – 2022-03-23 (×6): 60 mL
  Filled 2022-03-20 (×6): qty 60

## 2022-03-20 MED ORDER — VITAMIN B-12 1000 MCG PO TABS
1000.0000 ug | ORAL_TABLET | Freq: Every day | ORAL | Status: DC
  Administered 2022-03-21 – 2022-04-14 (×24): 1000 ug
  Filled 2022-03-20 (×26): qty 1

## 2022-03-20 MED ORDER — HEPARIN SODIUM (PORCINE) 5000 UNIT/ML IJ SOLN
5000.0000 [IU] | Freq: Three times a day (TID) | INTRAMUSCULAR | Status: DC
  Administered 2022-03-20 – 2022-03-22 (×7): 5000 [IU] via SUBCUTANEOUS
  Filled 2022-03-20 (×7): qty 1

## 2022-03-20 MED ORDER — ASPIRIN 300 MG RE SUPP
300.0000 mg | Freq: Every day | RECTAL | Status: DC
  Administered 2022-03-20: 300 mg via RECTAL
  Filled 2022-03-20: qty 1

## 2022-03-20 MED ORDER — VITAL 1.5 CAL PO LIQD
1000.0000 mL | ORAL | Status: DC
  Administered 2022-03-21 – 2022-03-22 (×2): 1000 mL

## 2022-03-20 MED ORDER — FOLIC ACID 1 MG PO TABS
1.0000 mg | ORAL_TABLET | Freq: Every day | ORAL | Status: DC
  Administered 2022-03-20 – 2022-04-14 (×25): 1 mg
  Filled 2022-03-20 (×27): qty 1

## 2022-03-20 MED ORDER — STROKE: EARLY STAGES OF RECOVERY BOOK
Freq: Once | Status: AC
  Filled 2022-03-20: qty 1

## 2022-03-20 MED ORDER — ADULT MULTIVITAMIN LIQUID CH
15.0000 mL | Freq: Every day | ORAL | Status: DC
  Administered 2022-03-20 – 2022-03-23 (×4): 15 mL
  Filled 2022-03-20 (×4): qty 15

## 2022-03-20 MED ORDER — ASPIRIN 81 MG PO CHEW
81.0000 mg | CHEWABLE_TABLET | Freq: Every day | ORAL | Status: DC
  Administered 2022-03-21 – 2022-03-23 (×3): 81 mg
  Filled 2022-03-20 (×3): qty 1

## 2022-03-20 MED ORDER — IOHEXOL 350 MG/ML SOLN
100.0000 mL | Freq: Once | INTRAVENOUS | Status: AC | PRN
  Administered 2022-03-20: 100 mL via INTRAVENOUS

## 2022-03-20 MED ORDER — ROSUVASTATIN CALCIUM 20 MG PO TABS
20.0000 mg | ORAL_TABLET | Freq: Every day | ORAL | Status: DC
  Administered 2022-03-21 – 2022-04-01 (×11): 20 mg
  Filled 2022-03-20 (×11): qty 1

## 2022-03-20 MED ORDER — GADOBUTROL 1 MMOL/ML IV SOLN
7.5000 mL | Freq: Once | INTRAVENOUS | Status: AC | PRN
  Administered 2022-03-20: 7.5 mL via INTRAVENOUS

## 2022-03-20 MED ORDER — PROSOURCE TF20 ENFIT COMPATIBL EN LIQD
60.0000 mL | Freq: Every day | ENTERAL | Status: DC
  Administered 2022-03-20: 60 mL
  Filled 2022-03-20: qty 60

## 2022-03-20 NOTE — Code Documentation (Signed)
Responded to Code Stroke called at 0154 for weakness, [id number]. CBG-122, NIH-23, CT head negative, CTA-no LVO. Pt taken to MRI at 0240. MRI-ANT spinal artery infarct. TNK not given-outside window. Pt not a candidate for IR. Pt transported back to 2H02 with 2H RN, RT, and RRT.

## 2022-03-20 NOTE — Progress Notes (Signed)
PT Cancellation Note  Patient Details Name: Rachael [name] MRN: [id number] DOB: 31-Oct-2000   Cancelled Treatment:    Reason Eval/Treat Not Completed: (P) Fatigue/lethargy limiting ability to participate Pt with light sedation, mother not in room. PT/OT to follow back for Evaluation when mother returns to room to provide PLOF and home set up.   Faren Florence B. Migdalia Dk PT, DPT Acute Rehabilitation Services Please use secure chat or  Call Office [phone]    [place] 03/20/2022, 1:20 PM

## 2022-03-20 NOTE — Progress Notes (Signed)
NAME:  Rachael [name], MRN:  [id number], DOB:  2000-07-12, LOS: 1 ADMISSION DATE:  03/20/2022, CONSULTATION DATE:  03/20/22 REFERRING MD:  drawbridge, CHIEF COMPLAINT:  shortness of breath and weakness   History of Present Illness:  Rachael [name] is a 22 y.o. F with history of depression and suicidal ideation who presented to [facility] ED with sudden onset of shortness of breath and upper and lower extremity weakness.  History taken from Epic as patient is intubated and no family is at the bedside.  She initially had a headache and back pain prior to onset but denied abdominal pain or chest pain.  Vitals showed an initial temp of 5F, bloodwork with WBC 18k, UDS + for bezos and THC.   Her symptoms progressed to the point she wasn't speaking and ABG showed hypercarbic respiratory failure so pt was intubated.  She had some bradycardia and hypotension post-intubation, so was started on peripheral Levophed and given atropine during transport   Pertinent  Medical History   has a past medical history of Depression, Hyperlipidemia, and Kidney abscess.   Significant Hospital Events: Including procedures, antibiotic start and stop dates in addition to other pertinent events   2/16 presented to [facility] with all weakness and shortness of breath requiring intubation and transfer to [facility]  Interim History / Subjective:  Awake, trying to talk around ETT  Objective   Blood pressure 99/60, pulse 65, temperature 98.2 F (36.8 C), resp. rate 20, height 5' 7"$  (1.702 m), weight 73.9 kg, SpO2 94 %.    Vent Mode: PRVC FiO2 (%):  [40 %-100 %] 60 % Set Rate:  [15 bmp-20 bmp] 20 bmp Vt Set:  [490 mL] 490 mL PEEP:  [5 cmH20] 5 cmH20 Plateau Pressure:  [15 cmH20-19 cmH20] 19 cmH20   Intake/Output Summary (Last 24 hours) at 03/20/2022 0750 Last data filed at 03/20/2022 0600 Gross per 24 hour  Intake 5483.17 ml  Output 1150 ml  Net 4333.17 ml    Filed Weights   03/06/2022 2035 03/20/22 0000  Weight: 90.7 kg 73.9  kg    General:  young woman lying in bed intubated, lightly sedated  HEENT: [place]/AT, eyes anicteric Neuro: awake, alert, able to blink yes. Trying to talk, comprehension appears intact. Not able to move head side to side, not able to move extremities. No significant tracheal cough, but grimaces CV: S1S2, RRR PULM:  rhales, clear ETT secretions. When placed on SBT, can initiate a few breaths, but very bradypneic GI: soft, NT Extremities: no c/c/e Skin: warm, dry, no rashes  CXR personally reviewed> ETT 2 cm above carina, silhouetted left hemidiaphragm, likely atelectasis. Atelectasis over lateral R major fissure.  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 With such a high C-spine injury, I worry she may be trach and/or vent dependent. -prevent hypoxia, hyperglycemia, fever -supportive care- frequent turns, start TF  Acute hypoxic & hypercarbic respiratory failure -LTVV -VAP prevention protocol -PAD protocol -daily SAT & SBT as appropriate -worry she may require tracheostomy  Hypothermia, bradycardia Shock likely neurogenic -norepi to maintain MAP >65; off this morning -external warming  Polysubstance abuse, unclear if  this could have been related to her inciting event -will counsel on the importance of cessation when appropriate  Unclear what her rehab potential will be at this point. Her mother understands this is a devastating illness.   Best Practice (right click and "Reselect all SmartList Selections" daily)   Diet/type: tubefeeds DVT  prophylaxis: prophylactic heparin  GI prophylaxis: PPI Lines: N/A Foley:  Yes, and it is still needed Code Status:  full code Last date of multidisciplinary goals of care discussion [ mother updated at bedside]  Labs   CBC: Recent Labs  Lab 03/18/2022 1825 03/07/2022 1919 03/25/2022 2019 03/04/2022 2210 03/20/22 0107  WBC 18.3*  --   --   --   --   NEUTROABS 15.0*  --   --   --   --   HGB 13.8 14.6 13.9 12.9 12.2  HCT 41.5 43.0 41.0 38.0 36.0  MCV 84.3  --   --   --   --   PLT 299  --   --   --   --      Basic Metabolic Panel: Recent Labs  Lab 03/13/2022 1825 03/06/2022 1919 03/06/2022 2019 03/10/2022 2210 03/20/22 0107  NA 136 137 139 139 136  K 3.9 3.8 4.0 5.1 4.5  CL 106  --   --   --   --   CO2 22  --   --   --   --   GLUCOSE 113*  --   --   --   --   BUN 7  --   --   --   --   CREATININE 0.79  --   --   --   --   CALCIUM 9.7  --   --   --   --     GFR: Estimated Creatinine Clearance: 108.2 mL/min (by C-G formula based on SCr of 0.79 mg/dL). Recent Labs  Lab 03/31/2022 1825 03/04/2022 1829 03/20/22 0030  WBC 18.3*  --   --   LATICACIDVEN  --  0.7 1.8     Liver Function Tests: Recent Labs  Lab 03/05/2022 1825  AST 14*  ALT 14  ALKPHOS 74  BILITOT 0.6  PROT 7.6  ALBUMIN 4.6     ABG    Component Value Date/Time   PHART 7.329 (L) 03/20/2022 0107   PCO2ART 33.7 03/20/2022 0107   PO2ART 301 (H) 03/20/2022 0107   HCO3 18.3 (L) 03/20/2022 0107   TCO2 19 (L) 03/20/2022 0107   ACIDBASEDEF 8.0 (H) 03/20/2022 0107   O2SAT 100 03/20/2022 0107     Coagulation Profile: Recent Labs  Lab 03/22/2022 1825  INR 1.0    Critical care time:      This patient is critically ill with multiple organ system failure which requires frequent high complexity decision making, assessment, support, evaluation, and titration of therapies. This was completed through the application of advanced monitoring technologies and extensive interpretation of multiple databases. During this  encounter critical care time was devoted to patient care services described in this note for 42 minutes.  Julian Hy, DO 03/20/22 8:30 AM [facility]  For contact information, see Amion. If no response to pager, please call PCCM consult pager. After hours, 7PM- 7AM, please call Elink.

## 2022-03-20 NOTE — Progress Notes (Signed)
An USGPIV (ultrasound guided PIV) has been placed for short-term vasopressor infusion. A correctly placed ivWatch must be used when administering Vasopressors. Should this treatment be needed beyond 72 hours, central line access should be obtained.  It will be the responsibility of the bedside nurse to follow best practice to prevent extravasations.   

## 2022-03-20 NOTE — H&P (Signed)
NAME:  Rachael [name], MRN:  [id number], DOB:  Dec 08, 2000, LOS: 1 ADMISSION DATE:  03/05/2022, CONSULTATION DATE:  03/20/22 REFERRING MD:  drawbridge, CHIEF COMPLAINT:  shortness of breath and weakness   History of Present Illness:  Rachael [name] is a 22 y.o. F with history of depression and suicidal ideation who presented to [facility] ED with sudden onset of shortness of breath and upper and lower extremity weakness.  History taken from Epic as patient is intubated and no family is at the bedside.  She initially had a headache and back pain prior to onset but denied abdominal pain or chest pain.  Vitals showed an initial temp of 74F, bloodwork with WBC 18k, UDS + for bezos and THC.   Her symptoms progressed to the point she wasn't speaking and ABG showed hypercarbic respiratory failure so pt was intubated.  She had some bradycardia and hypotension post-intubation, so was started on peripheral Levophed and given atropine during transport   Pertinent  Medical History   has a past medical history of Depression, Hyperlipidemia, and Kidney abscess.   Significant Hospital Events: Including procedures, antibiotic start and stop dates in addition to other pertinent events   2/16 presented to DB with all weakness and shortness of breath requiring intubation and transfer to [facility]  Interim History / Subjective:  Arrived to [facility] awake and stable on Levophed 30mg, only able to blink eyes to command  Objective   Blood pressure 122/74, pulse (!) 57, temperature (!) 92.7 F (33.7 C), resp. rate 20, height 5' 7"$  (1.702 m), weight 90.7 kg, SpO2 100 %.    Vent Mode: PRVC FiO2 (%):  [100 %] 100 % Set Rate:  [15 bmp-20 bmp] 20 bmp Vt Set:  [490 mL] 490 mL PEEP:  [5 cmH20] 5 cmH20 Plateau Pressure:  [15 cmH20-17 cmH20] 15 cmH20   Intake/Output Summary (Last 24 hours) at 03/20/2022 0038 Last data filed at 03/20/2022 0000 Gross per 24 hour  Intake 1600 ml  Output 500 ml  Net 1100 ml   Filed Weights    03/28/2022 2035  Weight: 90.7 kg    General:  well nourished young female, awake and intubated  HEENT: MM pink/moist, pupils equal Neuro: awake and following commands to blink eyes but unable to move extremities at all, triggering vent, minimal reflexes CV: s1s2 rrr, no m/r/g PULM:  clear bilaterally on PRVC with equal chest rise GI: soft, non-distended  Extremities: warm/dry, no edema, affirms thoracic and lumbar midline TTP Skin: no rashes or lesions   Resolved Hospital Problem list     Assessment & Plan:    Acute paralysis and hypoxic/hypercarbic respiratory failure Hypothermia, bradycardia Shock likely neurogenic Consider transverse myelitis, CTH negative -MRI brain and spine with and without contrast, consider LP pending Neurology recs -check HIV, RPR, CRP, ANA, sed rate, RF, B12, cortisol and procal. TSH was WNL -bair hugger, blood cultures obtained -bradycardia improved -neurology consult -fentanyl and propofol for PAD protocol --Maintain full vent support with SAT/SBT as tolerated -titrate Vent setting to maintain SpO2 greater than or equal to 90%. -HOB elevated 30 degrees. -Plateau pressures less than 30 cm H20.  -Follow chest x-ray, ABG prn.   -Bronchial hygiene and RT/bronchodilator protocol. -peripheral levophed to maintain MAP >65 -no clear source of infection, hold antibiotics         Best Practice (right click and "Reselect all SmartList Selections" daily)   Diet/type: NPO DVT prophylaxis: prophylactic heparin  GI prophylaxis: PPI Lines: N/A Foley:  Yes, and it  is still needed Code Status:  full code Last date of multidisciplinary goals of care discussion [pending, mom updated of patient's arrival]  Labs   CBC: Recent Labs  Lab 03/29/2022 1825 03/16/2022 1919 03/11/2022 2019 03/09/2022 2210  WBC 18.3*  --   --   --   NEUTROABS 15.0*  --   --   --   HGB 13.8 14.6 13.9 12.9  HCT 41.5 43.0 41.0 38.0  MCV 84.3  --   --   --   PLT 299  --   --   --      Basic Metabolic Panel: Recent Labs  Lab 03/23/2022 1825 03/08/2022 1919 03/29/2022 2019 03/14/2022 2210  NA 136 137 139 139  K 3.9 3.8 4.0 5.1  CL 106  --   --   --   CO2 22  --   --   --   GLUCOSE 113*  --   --   --   BUN 7  --   --   --   CREATININE 0.79  --   --   --   CALCIUM 9.7  --   --   --    GFR: Estimated Creatinine Clearance: 128.5 mL/min (by C-G formula based on SCr of 0.79 mg/dL). Recent Labs  Lab 03/25/2022 1825 03/11/2022 1829  WBC 18.3*  --   LATICACIDVEN  --  0.7    Liver Function Tests: Recent Labs  Lab 03/28/2022 1825  AST 14*  ALT 14  ALKPHOS 74  BILITOT 0.6  PROT 7.6  ALBUMIN 4.6   No results for input(s): "LIPASE", "AMYLASE" in the last 168 hours. No results for input(s): "AMMONIA" in the last 168 hours.  ABG    Component Value Date/Time   PHART 7.175 (LL) 03/23/2022 2210   PCO2ART 58.8 (H) 03/29/2022 2210   PO2ART 29 (LL) 03/13/2022 2210   HCO3 23.2 03/13/2022 2210   TCO2 25 03/27/2022 2210   ACIDBASEDEF 7.0 (H) 03/08/2022 2210   O2SAT 58 03/27/2022 2210     Coagulation Profile: Recent Labs  Lab 03/15/2022 1825  INR 1.0    Cardiac Enzymes: No results for input(s): "CKTOTAL", "CKMB", "CKMBINDEX", "TROPONINI" in the last 168 hours.  HbA1C: Hgb A1c MFr Bld  Date/Time Value Ref Range Status  04/03/2016 06:34 AM 5.3 4.8 - 5.6 % Final    Comment:    (NOTE)         Pre-diabetes: 5.7 - 6.4         Diabetes: >6.4         Glycemic control for adults with diabetes: <7.0     CBG: Recent Labs  Lab 03/31/2022 1921 03/20/22 0016  GLUCAP 129* 122*    Review of Systems:   Unable to obtain  Past Medical History:  She,  has a past medical history of Depression, Hyperlipidemia, and Kidney abscess.   Surgical History:   Past Surgical History:  Procedure Laterality Date   NO PAST SURGERIES       Social History:   reports that she quit smoking about 2 years ago. Her smoking use included cigarettes. She has a 0.99 pack-year smoking  history. She has been exposed to tobacco smoke. She has never used smokeless tobacco. She reports current drug use. Frequency: 7.00 times per week. Drug: Marijuana. She reports that she does not drink alcohol.   Family History:  Her family history includes Asthma in her mother; Diabetes in her paternal grandmother; Hyperlipidemia in her maternal grandmother; Hypertension in  her maternal grandmother and another family member.   Allergies No Known Allergies   Home Medications  Prior to Admission medications   Medication Sig Start Date End Date Taking? Authorizing Provider  acetaminophen (TYLENOL) 325 MG tablet Take 2 tablets (650 mg total) by mouth every 4 (four) hours as needed (for pain scale < 4). 11/18/19   Janet Berlin, MD  cetirizine (ZYRTEC ALLERGY) 10 MG tablet Take 1 tablet (10 mg total) by mouth daily. 11/18/21   Jaynee Eagles, PA-C  docusate sodium (COLACE) 250 MG capsule Take 1 capsule (250 mg total) by mouth daily. Patient taking differently: Take 250 mg by mouth daily as needed for constipation.  06/23/19   Wende Mott, CNM  ferrous sulfate (FERROUSUL) 325 (65 FE) MG tablet Take 1 tablet (325 mg total) by mouth every other day. 09/13/19   Leftwich-Kirby, Kathie Dike, CNM  ibuprofen (ADVIL) 800 MG tablet Take 1 tablet (800 mg total) by mouth every 8 (eight) hours as needed for up to 21 doses for fever, headache, mild pain or moderate pain. 04/08/21   Lynden Oxford Scales, PA-C  ondansetron (ZOFRAN ODT) 4 MG disintegrating tablet Take 1 tablet (4 mg total) by mouth every 8 (eight) hours as needed for nausea or vomiting. 06/23/19   Wende Mott, CNM  predniSONE (DELTASONE) 20 MG tablet Take 2 tablets daily with breakfast. 11/18/21   Jaynee Eagles, PA-C  Prenat-FeCbn-FeAsp-Meth-FA-DHA (PRENATE MINI) 18-0.6-0.4-350 MG CAPS Take 1 tablet by mouth daily. 09/13/19   Leftwich-Kirby, Kathie Dike, CNM  Prenatal Vit-Fe Phos-FA-Omega (VITAFOL GUMMIES) 3.33-0.333-34.8 MG CHEW Chew 3 each by mouth  daily. Patient not taking: Reported on 11/18/2019 05/21/19   Constant, Peggy, MD  promethazine-dextromethorphan (PROMETHAZINE-DM) 6.25-15 MG/5ML syrup Take 2.5 mLs by mouth 3 (three) times daily as needed for cough. 11/18/21   Jaynee Eagles, PA-C     Critical care time: 45 minutes     CRITICAL CARE Performed by: Otilio Carpen Zabdi Mis   Total critical care time: 45 minutes  Critical care time was exclusive of separately billable procedures and treating other patients.  Critical care was necessary to treat or prevent imminent or life-threatening deterioration.  Critical care was time spent personally by me on the following activities: development of treatment plan with patient and/or surrogate as well as nursing, discussions with consultants, evaluation of patient's response to treatment, examination of patient, obtaining history from patient or surrogate, ordering and performing treatments and interventions, ordering and review of laboratory studies, ordering and review of radiographic studies, pulse oximetry and re-evaluation of patient's condition.  Otilio Carpen Shereta Crothers, PA-C [facility] See Amion for pager If no response to pager , please call [phone] until 7pm After 7:00 pm call Elink  [id number]?[email or web address]   Attending MD note  Patient was independently seen and examined, treatment plan was discussed with the  Advance Practice Provider. I saw patient at about 230 am while neurology was at bedside.  I agree with the above note by Mickel Baas Cristie Mckinney.  I have personally reviewed the clinical findings, labs, ECG, imaging studies and management of this patient in detail. I have also reviewed the orders written for this patient which were under my direction. I agree with the documentation, as recorded by the Advance Practice Provider.   Briefly, Leanah Elsmore is a 22 y.o. female with hx of depression, HLD, kidney abscess.   I was paged by [facility] ED doctor at [phone] regarding this patient  Was  reported to me that she  was altered, weak, progressed to where couldn't speak during ER stay, appeared to by hypopneic so has intubated.  Was hypercarbic.  Leukocytosis, recent malaise.  Thought to be septic.  Had taken some opiate pain medication that day as well.  Thought perhaps septic with weakness from septic, perhaps depressed resp drive from opiate use for her pain control.   ON arrival here after midnight, found to be intubated, profoundly weak.  Neurology consulted.  Code stroke called.  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  Appreciate neuro guidance.    My cc time 34 minutes  This patient is critically ill, requiring high complexity decision making for assessment and plan, frequent evaluation, application of advanced monitoring and extensive interpretations of multiple databases.    Critical Care time devoted to patient care services described in this note is 35 minutes, not including time spent on procedures, teaching or supervising.    Collier Bullock, MD

## 2022-03-20 NOTE — Evaluation (Addendum)
Speech Language Pathology Evaluation Patient Details Name: Rachael [name] MRN: [id number] DOB: 09-28-2000 Today's Date: 03/20/2022 Time: [id number] SLP Time Calculation (min) (ACUTE ONLY): 18 min  Problem List:  Patient Active Problem List   Diagnosis Date Noted   Paralysis (Industry) 03/20/2022   Altered mental state 03/20/2022   Vaginal delivery 11/16/2019   IUGR (intrauterine growth restriction) affecting care of mother, third trimester, fetus 1 11/15/2019   Pregnancy affected by fetal growth restriction 10/08/2019   Supervision of normal pregnancy, antepartum 05/15/2019   High risk sexual behavior in adolescent    Pyelonephritis 06/21/2017   Suicidal ideation 04/02/2016   MDD (major depressive disorder) 04/01/2016   Past Medical History:  Past Medical History:  Diagnosis Date   Depression    few yrs ago ([place]), doing good now   Hyperlipidemia    Kidney abscess    Past Surgical History:  Past Surgical History:  Procedure Laterality Date   NO PAST SURGERIES     HPI:  Rachael [name] is a 22 y.o. female with PMH significant for depression, HLD, Kidney abscess who presents with acute arms and leg weakness, dyspnea and hypercabia.  Pt was intubated on 2/16.   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   Assessment / Plan / Recommendation Clinical Impression  Pt was seen for a cognitive-linguistic evaluation.  Pt was encountered asleep in bed with mother at bedside.  Pt's mother reported that the pt resides with her and her other children (pt's younger siblings, ages 39 and 75).  She stated that the pt has a two year old daughter and that she is currently unemployed.  Pt is currently intubated and unable to use upper and lower extremities given spinal cord infarct.  She was able to communicate via yes/no questions with eye blink and eye gaze.  She was oriented to self, year, and place with 100% accuracy  via eye blink and her yes/no response accuracy was also 100%.  Pt was able to follow 2-step commands with simple facial and oral motor movements.  She was able to identify written words with eye gaze when given two options and she was able to blink eyes the correct amount of times when SLP held up a certain number of fingers without verbal instruction.  Recommend use of eye gaze and eye blink in conjunction with written words or simple picture communication boards to communicate with the pt.  SLP will continue to follow up to assist with functional communication.    SLP Assessment  SLP Recommendation/Assessment: Patient needs continued Speech Lanaguage Pathology Services SLP Visit Diagnosis: Aphonia (R49.1);Cognitive communication deficit (R41.841) (secondary to intubation)    Recommendations for follow up therapy are one component of a multi-disciplinary discharge planning process, led by the attending physician.  Recommendations may be updated based on patient status, additional functional criteria and insurance authorization.    Follow Up Recommendations  Other (comment) (TBD)    Assistance Recommended at Discharge     Functional Status Assessment Patient has had a recent decline in their functional status and demonstrates the ability to make significant improvements in function in a reasonable and predictable amount of time.  Frequency and Duration min 2x/week  2 weeks      SLP Evaluation Cognition  Overall Cognitive Status: Difficult to assess Arousal/Alertness: Awake/alert Orientation Level: Oriented to place;Oriented to person (given choice of two) Year: 2024 Attention: Focused Focused Attention: Appears intact  Comprehension  Auditory Comprehension Overall Auditory Comprehension: Appears within functional limits for tasks assessed Yes/No Questions: Within Functional Limits Commands: Within Functional Limits Interfering Components:  (intubation) EffectiveTechniques:   (alternative means of communication)    Expression Expression Primary Mode of Expression:  (nonverbal- eye blink and eye gaze) Verbal Expression Interfering Components:  (intubation) Non-Verbal Means of Communication: Eye blink;Eye gaze;Communication board   Oral / Motor  Oral Motor/Sensory Function Overall Oral Motor/Sensory Function: Other (comment) (Difficult to assess given endotracheal tube; however, pt was able to achieve lateral lingual movement and bilabial opening.) Motor Speech Overall Motor Speech:  (unable to assess)           Bretta Bang, M.S., [place] Office: [phone]  [place] 03/20/2022, 10:15 AM

## 2022-03-20 NOTE — H&P (Incomplete)
NAME:  Rachael [name], MRN:  [id number], DOB:  11-24-2000, LOS: 1 ADMISSION DATE:  03/04/2022, CONSULTATION DATE:  03/20/22 REFERRING MD:  drawbridge, CHIEF COMPLAINT:  shortness of breath and weakness   History of Present Illness:  Rachael [name] is a 22 y.o. F with history of depression and suicidal ideation who presented to [facility] with sudden onset of shortness of breath and upper and lower extremity weakness.  History taken from Epic as patient is intubated and no family is at the bedside.  She initially had a headache and back pain prior to onset but denied abdominal pain or chest pain.  Vitals showed an initial temp of 41F, bloodwork with WBC 18k, UDS + for bezos and THC.   Her symptoms progressed to the point she wasn't speaking and ABG showed hypercarbic respiratory failure so pt was intubated.  She had some bradycardia and hypotension post-intubation, so was started on peripheral Levophed and given atropine during transport   Pertinent  Medical History   has a past medical history of Depression, Hyperlipidemia, and Kidney abscess.   Significant Hospital Events: Including procedures, antibiotic start and stop dates in addition to other pertinent events   2/16 presented to [facility] with all weakness and shortness of breath requiring intubation and transfer to [facility]  Interim History / Subjective:  Arrived to [facility] awake and stable on Levophed 22mg, only able to blink eyes to command  Objective   Blood pressure 122/74, pulse (!) 57, temperature (!) 92.7 F (33.7 C), resp. rate 20, height 5' 7"$  (1.702 m), weight 90.7 kg, SpO2 100 %.    Vent Mode: PRVC FiO2 (%):  [100 %] 100 % Set Rate:  [15 bmp-20 bmp] 20 bmp Vt Set:  [490 mL] 490 mL PEEP:  [5 cmH20] 5 cmH20 Plateau Pressure:  [15 cmH20-17 cmH20] 15 cmH20   Intake/Output Summary (Last 24 hours) at 03/20/2022 0038 Last data filed at 03/20/2022 0000 Gross per 24 hour  Intake 1600 ml  Output 500 ml  Net 1100 ml   Filed Weights    03/24/2022 2035  Weight: 90.7 kg    General:  well nourished young female, awake and intubated  HEENT: MM pink/moist, pupils equal Neuro: awake and following commands to blink eyes but unable to move extremities at all, triggering vent, minimal reflexes CV: s1s2 rrr, no m/r/g PULM:  clear bilaterally on PRVC with equal chest rise GI: soft, non-distended  Extremities: warm/dry, no edema, affirms thoracic and lumbar midline TTP Skin: no rashes or lesions   Resolved Hospital Problem list     Assessment & Plan:    Acute paralysis and hypoxic/hypercarbic respiratory failure Hypothermia, bradycardia Shock likely neurogenic Consider transverse myelitis, CTH negative -MRI brain and spine with and without contrast, consider LP pending Neurology recs -check HIV, RPR, CRP, ANA, sed rate, RF, B12, cortisol and procal. TSH was WNL -bair hugger, blood cultures obtained -bradycardia improved -neurology consult -fentanyl and propofol for PAD protocol --Maintain full vent support with SAT/SBT as tolerated -titrate Vent setting to maintain SpO2 greater than or equal to 90%. -HOB elevated 30 degrees. -Plateau pressures less than 30 cm H20.  -Follow chest x-ray, ABG prn.   -Bronchial hygiene and RT/bronchodilator protocol. -peripheral levophed to maintain MAP >65 -no clear source of infection, hold antibiotics         Best Practice (right click and "Reselect all SmartList Selections" daily)   Diet/type: NPO DVT prophylaxis: prophylactic heparin  GI prophylaxis: PPI Lines: N/A Foley:  Yes, and it  is still needed Code Status:  full code Last date of multidisciplinary goals of care discussion [pending, mom updated of patient's arrival]  Labs   CBC: Recent Labs  Lab 03/18/2022 1825 03/15/2022 1919 03/27/2022 2019 03/17/2022 2210  WBC 18.3*  --   --   --   NEUTROABS 15.0*  --   --   --   HGB 13.8 14.6 13.9 12.9  HCT 41.5 43.0 41.0 38.0  MCV 84.3  --   --   --   PLT 299  --   --   --      Basic Metabolic Panel: Recent Labs  Lab 03/12/2022 1825 03/09/2022 1919 03/25/2022 2019 03/30/2022 2210  NA 136 137 139 139  K 3.9 3.8 4.0 5.1  CL 106  --   --   --   CO2 22  --   --   --   GLUCOSE 113*  --   --   --   BUN 7  --   --   --   CREATININE 0.79  --   --   --   CALCIUM 9.7  --   --   --    GFR: Estimated Creatinine Clearance: 128.5 mL/min (by C-G formula based on SCr of 0.79 mg/dL). Recent Labs  Lab 03/16/2022 1825 03/17/2022 1829  WBC 18.3*  --   LATICACIDVEN  --  0.7    Liver Function Tests: Recent Labs  Lab 03/14/2022 1825  AST 14*  ALT 14  ALKPHOS 74  BILITOT 0.6  PROT 7.6  ALBUMIN 4.6   No results for input(s): "LIPASE", "AMYLASE" in the last 168 hours. No results for input(s): "AMMONIA" in the last 168 hours.  ABG    Component Value Date/Time   PHART 7.175 (LL) 03/18/2022 2210   PCO2ART 58.8 (H) 03/05/2022 2210   PO2ART 29 (LL) 03/31/2022 2210   HCO3 23.2 03/20/2022 2210   TCO2 25 03/12/2022 2210   ACIDBASEDEF 7.0 (H) 03/09/2022 2210   O2SAT 58 03/10/2022 2210     Coagulation Profile: Recent Labs  Lab 03/07/2022 1825  INR 1.0    Cardiac Enzymes: No results for input(s): "CKTOTAL", "CKMB", "CKMBINDEX", "TROPONINI" in the last 168 hours.  HbA1C: Hgb A1c MFr Bld  Date/Time Value Ref Range Status  04/03/2016 06:34 AM 5.3 4.8 - 5.6 % Final    Comment:    (NOTE)         Pre-diabetes: 5.7 - 6.4         Diabetes: >6.4         Glycemic control for adults with diabetes: <7.0     CBG: Recent Labs  Lab 03/09/2022 1921 03/20/22 0016  GLUCAP 129* 122*    Review of Systems:   Unable to obtain  Past Medical History:  She,  has a past medical history of Depression, Hyperlipidemia, and Kidney abscess.   Surgical History:   Past Surgical History:  Procedure Laterality Date  . NO PAST SURGERIES       Social History:   reports that she quit smoking about 2 years ago. Her smoking use included cigarettes. She has a 0.99 pack-year smoking  history. She has been exposed to tobacco smoke. She has never used smokeless tobacco. She reports current drug use. Frequency: 7.00 times per week. Drug: Marijuana. She reports that she does not drink alcohol.   Family History:  Her family history includes Asthma in her mother; Diabetes in her paternal grandmother; Hyperlipidemia in her maternal grandmother; Hypertension in  her maternal grandmother and another family member.   Allergies No Known Allergies   Home Medications  Prior to Admission medications   Medication Sig Start Date End Date Taking? Authorizing Provider  acetaminophen (TYLENOL) 325 MG tablet Take 2 tablets (650 mg total) by mouth every 4 (four) hours as needed (for pain scale < 4). 11/18/19   Janet Berlin, MD  cetirizine (ZYRTEC ALLERGY) 10 MG tablet Take 1 tablet (10 mg total) by mouth daily. 11/18/21   Jaynee Eagles, PA-C  docusate sodium (COLACE) 250 MG capsule Take 1 capsule (250 mg total) by mouth daily. Patient taking differently: Take 250 mg by mouth daily as needed for constipation.  06/23/19   Wende Mott, CNM  ferrous sulfate (FERROUSUL) 325 (65 FE) MG tablet Take 1 tablet (325 mg total) by mouth every other day. 09/13/19   Leftwich-Kirby, Kathie Dike, CNM  ibuprofen (ADVIL) 800 MG tablet Take 1 tablet (800 mg total) by mouth every 8 (eight) hours as needed for up to 21 doses for fever, headache, mild pain or moderate pain. 04/08/21   Lynden Oxford Scales, PA-C  ondansetron (ZOFRAN ODT) 4 MG disintegrating tablet Take 1 tablet (4 mg total) by mouth every 8 (eight) hours as needed for nausea or vomiting. 06/23/19   Wende Mott, CNM  predniSONE (DELTASONE) 20 MG tablet Take 2 tablets daily with breakfast. 11/18/21   Jaynee Eagles, PA-C  Prenat-FeCbn-FeAsp-Meth-FA-DHA (PRENATE MINI) 18-0.6-0.4-350 MG CAPS Take 1 tablet by mouth daily. 09/13/19   Leftwich-Kirby, Kathie Dike, CNM  Prenatal Vit-Fe Phos-FA-Omega (VITAFOL GUMMIES) 3.33-0.333-34.8 MG CHEW Chew 3 each by mouth  daily. Patient not taking: Reported on 11/18/2019 05/21/19   Constant, Peggy, MD  promethazine-dextromethorphan (PROMETHAZINE-DM) 6.25-15 MG/5ML syrup Take 2.5 mLs by mouth 3 (three) times daily as needed for cough. 11/18/21   Jaynee Eagles, PA-C     Critical care time: 45 minutes     CRITICAL CARE Performed by: Otilio Carpen Gleason   Total critical care time: 45 minutes  Critical care time was exclusive of separately billable procedures and treating other patients.  Critical care was necessary to treat or prevent imminent or life-threatening deterioration.  Critical care was time spent personally by me on the following activities: development of treatment plan with patient and/or surrogate as well as nursing, discussions with consultants, evaluation of patient's response to treatment, examination of patient, obtaining history from patient or surrogate, ordering and performing treatments and interventions, ordering and review of laboratory studies, ordering and review of radiographic studies, pulse oximetry and re-evaluation of patient's condition.  Otilio Carpen Gleason, PA-C [facility] See Amion for pager If no response to pager , please call [phone] until 7pm After 7:00 pm call Elink  [phone]   Attending MD note  Patient was independently seen and examined, treatment plan was discussed with the  Advance Practice Provider. I agree with the above note by Mickel Baas Gleason.  I have personally reviewed the clinical findings, labs, ECG, imaging studies and management of this patient in detail. I have also reviewed the orders written for this patient which were under my direction. I agree with the documentation, as recorded by the Advance Practice Provider.   Briefly, Rubyann Grieb is a 22 y.o. female with hx of depression, HLD, kidney abscess.  Subjective:   Objective: Vitals:   03/20/22 2000 03/20/22 2307  BP:    Pulse:    Resp:    Temp:    SpO2: 96% 90%  FiO2 (%):  [30 %] 30 % No intake or output data in the 24 hours ending 11/30/17 2358  ***  General:   Neuro:   HEENT:  [place]/AT, No JVD noted, PERRL Cardiovascular:  RRR, no MRG Lungs:   Abdomen:  Soft, non-distended Musculoskeletal:  No acute deformity Skin:  Intact, MMM  CXR images ***  Impression/Plan: ***  My cc time *** minutes  This patient is critically ill, requiring high complexity decision making for assessment and plan, frequent evaluation, application of advanced monitoring and extensive interpretations of multiple databases.    Critical Care time devoted to patient care services described in this note is *** minutes, not including time spent on procedures, teaching or supervising.    Collier Bullock, MD

## 2022-03-20 NOTE — Progress Notes (Signed)
eLink Physician-Brief Progress Note Patient Name: Rachael [name] DOB: [birth year] MRN: [id number]   Date of Service  03/20/2022  HPI/Events of Note  22 yr old female with hx of depression presents with complaints of headache, back pain, and ext weakness with utox positive for opiates, benzoa, and cannabis.  She progressed to hypercapneic resp failure.  Eval underway looking for causes of resp depression and/or resp muscle weakness.  eICU Interventions  Chart reviewed     Intervention Category Evaluation Type: New Patient Evaluation  Mauri Brooklyn, P 03/20/2022, 2:13 AM

## 2022-03-20 NOTE — Progress Notes (Addendum)
STROKE TEAM PROGRESS NOTE   SUBJECTIVE (INTERVAL HISTORY) Her boyfriend and RN are at the bedside.  Overall her condition is unchanged. Pt still intubated on low dose sedation. Pt open eyes on voice, following simple commands, able to mouth answers, moving eyes, open mouth but not able to move neck or extremities. Sensation seems intact. MRI showed upper cervical cord infarct.   Per boyfriend, pt had birth control depot implanted 2 years ago. She is daily smoker, 1PPD, daily drinker, half pine daily, THC daily user. Also takes percocert for back pain and Xanax for anxiety PRN.    OBJECTIVE Temp:  [89.2 F (31.8 C)-100 F (37.8 C)] 99.9 F (37.7 C) (02/17 1745) Pulse Rate:  [38-90] 65 (02/17 1745) Cardiac Rhythm: Normal sinus rhythm (02/17 1600) Resp:  [14-25] 20 (02/17 1745) BP: (82-149)/(39-99) 105/57 (02/17 1745) SpO2:  [72 %-100 %] 94 % (02/17 1745) FiO2 (%):  [40 %-100 %] 60 % (02/17 1600) Weight:  [73.9 kg-90.7 kg] 73.9 kg (02/17 0000)  Recent Labs  Lab 03/20/22 0016 03/20/22 0418 03/20/22 0806 03/20/22 1140 03/20/22 1631  GLUCAP 122* 117* 93 101* 116*   Recent Labs  Lab 03/23/2022 1825 03/16/2022 1919 03/13/2022 2019 03/21/2022 2210 03/20/22 0107 03/20/22 1010  NA 136 137 139 139 136 138  K 3.9 3.8 4.0 5.1 4.5 3.8  CL 106  --   --   --   --  110  CO2 22  --   --   --   --  20*  GLUCOSE 113*  --   --   --   --  102*  BUN 7  --   --   --   --  <5*  CREATININE 0.79  --   --   --   --  0.76  CALCIUM 9.7  --   --   --   --  8.0*  MG  --   --   --   --   --  1.8  PHOS  --   --   --   --   --  3.7   Recent Labs  Lab 03/29/2022 1825  AST 14*  ALT 14  ALKPHOS 74  BILITOT 0.6  PROT 7.6  ALBUMIN 4.6   Recent Labs  Lab 03/09/2022 1825 03/25/2022 1919 03/30/2022 2019 03/14/2022 2210 03/20/22 0107 03/20/22 1010  WBC 18.3*  --   --   --   --  13.5*  NEUTROABS 15.0*  --   --   --   --   --   HGB 13.8 14.6 13.9 12.9 12.2 11.1*  HCT 41.5 43.0 41.0 38.0 36.0 34.9*  MCV 84.3   --   --   --   --  87.5  PLT 299  --   --   --   --  221   No results for input(s): "CKTOTAL", "CKMB", "CKMBINDEX", "TROPONINI" in the last 168 hours. Recent Labs    03/15/2022 1825  LABPROT 12.6  INR 1.0   Recent Labs    03/29/2022 1825  COLORURINE YELLOW  LABSPEC 1.033*  PHURINE 6.5  GLUCOSEU NEGATIVE  HGBUR NEGATIVE  BILIRUBINUR NEGATIVE  KETONESUR 40*  PROTEINUR 30*  NITRITE NEGATIVE  LEUKOCYTESUR NEGATIVE       Component Value Date/Time   CHOL 176 03/20/2022 1010   TRIG 138 03/20/2022 1010   TRIG 140 03/20/2022 1010   HDL 37 (L) 03/20/2022 1010   CHOLHDL 4.8 03/20/2022 1010   VLDL 28 03/20/2022 1010  LDLCALC 111 (H) 03/20/2022 1010   Lab Results  Component Value Date   HGBA1C 5.3 03/20/2022      Component Value Date/Time   LABOPIA POSITIVE (A) 03/18/2022 1825   COCAINSCRNUR NONE DETECTED 03/12/2022 1825   LABBENZ POSITIVE (A) 03/29/2022 1825   AMPHETMU NONE DETECTED 03/22/2022 1825   THCU POSITIVE (A) 03/22/2022 1825   LABBARB NONE DETECTED 03/23/2022 1825    Recent Labs  Lab 03/09/2022 1825  ETH <10    I have personally reviewed the radiological images below and agree with the radiology interpretations.  ECHOCARDIOGRAM COMPLETE  Result Date: 03/20/2022    ECHOCARDIOGRAM REPORT   Patient Name:   Rachael [name] Date of Exam: 03/20/2022 Medical Rec #:  [id number]        Height:       67.0 in Accession #:    [id number]       Weight:       162.9 lb Date of Birth:  12-Jun-2000         BSA:          1.853 m Patient Age:    22 years         BP:           100/52 mmHg Patient Gender: F                HR:           63 bpm. Exam Location:  Inpatient Procedure: 2D Echo, Cardiac Doppler, Color Doppler and Strain Analysis Indications:    Stroke I63.9  History:        Patient has no prior history of Echocardiogram examinations.  Sonographer:    Gambrills Referring Phys: [id number] [facility]  Sonographer Comments: Echo performed with patient supine and on artificial  respirator. IMPRESSIONS  1. Left ventricular ejection fraction, by estimation, is 55 to 60%. The left ventricle has normal function. The left ventricle has no regional wall motion abnormalities. Left ventricular diastolic parameters were normal. The average left ventricular global longitudinal strain is -25.9 %. The global longitudinal strain is normal.  2. Right ventricular systolic function is normal. The right ventricular size is normal. There is normal pulmonary artery systolic pressure.  3. The mitral valve is normal in structure. No evidence of mitral valve regurgitation. No evidence of mitral stenosis.  4. The aortic valve is tricuspid. Aortic valve regurgitation is not visualized. No aortic stenosis is present.  5. The inferior vena cava is normal in size with greater than 50% respiratory variability, suggesting right atrial pressure of 3 mmHg. FINDINGS  Left Ventricle: Left ventricular ejection fraction, by estimation, is 55 to 60%. The left ventricle has normal function. The left ventricle has no regional wall motion abnormalities. The average left ventricular global longitudinal strain is -25.9 %. The global longitudinal strain is normal. The left ventricular internal cavity size was normal in size. There is no left ventricular hypertrophy. Left ventricular diastolic parameters were normal. Right Ventricle: The right ventricular size is normal. No increase in right ventricular wall thickness. Right ventricular systolic function is normal. There is normal pulmonary artery systolic pressure. The tricuspid regurgitant velocity is 2.35 m/s, and  with an assumed right atrial pressure of 8 mmHg, the estimated right ventricular systolic pressure is 0000000 mmHg. Left Atrium: Left atrial size was normal in size. Right Atrium: Right atrial size was normal in size. Pericardium: There is no evidence of pericardial effusion. Mitral Valve: The mitral valve is normal in structure. No  evidence of mitral valve  regurgitation. No evidence of mitral valve stenosis. Tricuspid Valve: The tricuspid valve is normal in structure. Tricuspid valve regurgitation is mild . No evidence of tricuspid stenosis. Aortic Valve: The aortic valve is tricuspid. Aortic valve regurgitation is not visualized. No aortic stenosis is present. Pulmonic Valve: The pulmonic valve was normal in structure. Pulmonic valve regurgitation is not visualized. No evidence of pulmonic stenosis. Aorta: The aortic root is normal in size and structure. Venous: The inferior vena cava is normal in size with greater than 50% respiratory variability, suggesting right atrial pressure of 3 mmHg. IAS/Shunts: No atrial level shunt detected by color flow Doppler.  LEFT VENTRICLE PLAX 2D LVIDd:         4.70 cm   Diastology LVIDs:         2.60 cm   LV e' medial:    9.25 cm/s LV PW:         1.10 cm   LV E/e' medial:  8.8 LV IVS:        1.00 cm   LV e' lateral:   19.60 cm/s LVOT diam:     2.30 cm   LV E/e' lateral: 4.1 LV SV:         85 LV SV Index:   46        2D Longitudinal Strain LVOT Area:     4.15 cm  2D Strain GLS Avg:     -25.9 %  RIGHT VENTRICLE             IVC RV S prime:     12.60 cm/s  IVC diam: 2.00 cm TAPSE (M-mode): 2.2 cm LEFT ATRIUM             Index        RIGHT ATRIUM           Index LA diam:        3.40 cm 1.83 cm/m   RA Area:     14.90 cm LA Vol (A2C):   47.5 ml 25.63 ml/m  RA Volume:   38.50 ml  20.77 ml/m LA Vol (A4C):   29.8 ml 16.08 ml/m LA Biplane Vol: 39.9 ml 21.53 ml/m  AORTIC VALVE LVOT Vmax:   97.45 cm/s LVOT Vmean:  62.650 cm/s LVOT VTI:    0.206 m  AORTA Ao Root diam: 2.70 cm Ao Asc diam:  2.30 cm Ao Desc diam: 2.00 cm MITRAL VALVE               TRICUSPID VALVE MV Area (PHT): 2.91 cm    TR Peak grad:   22.1 mmHg MV Decel Time: 261 msec    TR Vmax:        235.00 cm/s MV E velocity: 81.20 cm/s MV A velocity: 52.70 cm/s  SHUNTS MV E/A ratio:  1.54        Systemic VTI:  0.21 m                            Systemic Diam: 2.30 cm Jenkins Rouge MD  Electronically signed by Jenkins Rouge MD Signature Date/Time: 03/20/2022/11:01:55 AM    Final    DG CHEST PORT 1 VIEW  Result Date: 03/20/2022 CLINICAL DATA:  Shortness of breath EXAM: PORTABLE CHEST 1 VIEW COMPARISON:  03/29/2022 FINDINGS: Endotracheal tube with tip between the clavicular heads and carina. An enteric tube reaches the stomach. Artifact from EKG leads. Linear opacity in the right mid  lung. Dense opacity with volume loss at the left base. No pneumothorax IMPRESSION: 1. Worsening left lower lobe aeration with volume loss. Mild atelectasis on the right. 2. Unremarkable hardware positioning. Electronically Signed   By: Jorje Guild M.D.   On: 03/20/2022 06:42   MR CERVICAL SPINE W WO CONTRAST  Result Date: 03/20/2022 CLINICAL DATA:  Myelopathy EXAM: MRI CERVICAL SPINE WITHOUT AND WITH CONTRAST TECHNIQUE: Multiplanar and multiecho pulse sequences of the cervical spine, to include the craniocervical junction and cervicothoracic junction, were obtained without and with intravenous contrast. CONTRAST:  7.5 mL Gadavist COMPARISON:  No prior MRI of the cervical spine, correlation is made with same day MRI head and CT cervical spine FINDINGS: Alignment: No listhesis. Vertebrae: No fracture, evidence of discitis, or bone lesion. Cord: Restricted diffusion with ADC correlate along the bilateral anterior aspect of the spinal cord from the craniocervical junction (series 6, image 93) to the level of C7 (series 6, image 56). This correlates with increased T2 hyperintense signal, most likely in the anterior horn cells. Spinal cord is otherwise normal in caliber and signal. No abnormal enhancement. Posterior Fossa, vertebral arteries, paraspinal tissues: Negative. Disc levels: Preserved disc height and disc hydration. No spinal canal stenosis or neural foraminal narrowing. IMPRESSION: Restricted diffusion and increased T2 signal along the anterior aspect of the spinal cord from the craniocervical junction to  the level of C7, concerning for acute spinal cord ischemia in an anterior spinal artery pattern. These findings were discussed by telephone on 03/20/2022 at 3:30 is am with provider [facility] . Electronically Signed   By: Merilyn Baba M.D.   On: 03/20/2022 03:54   MR BRAIN WO CONTRAST  Result Date: 03/20/2022 CLINICAL DATA:  Altered mental status, stroke suspected EXAM: MRI HEAD WITHOUT CONTRAST TECHNIQUE: Multiplanar, multiecho pulse sequences of the brain and surrounding structures were obtained without intravenous contrast. COMPARISON:  None Available. FINDINGS: Brain: Restricted diffusion with ADC correlate in the anteromedial cervical spinal cord (series 5, image 54) at the craniocervical junction. No other restricted diffusion to suggest acute or subacute infarct. No acute hemorrhage, mass, mass effect, or midline shift. No hydrocephalus or extra-axial collection. Normal pituitary and craniocervical junction. No hemosiderin deposition to suggest remote hemorrhage. Normal cerebral volume for age. Vascular: Normal arterial flow voids. Skull and upper cervical spine: Normal marrow signal. Sinuses/Orbits: Clear paranasal sinuses. No acute finding in the orbits. Other: Trace fluid in the right mastoid air cells. IMPRESSION: 1. 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 Attention on subsequent MRI cervical spine. 2. No other acute intracranial process. These findings were discussed by telephone on 03/20/2022 at 3:05 am with provider [facility] . Electronically Signed   By: Merilyn Baba M.D.   On: 03/20/2022 03:14   CT C-SPINE NO CHARGE  Result Date: 03/20/2022 CLINICAL DATA:  Stroke code, concern for cervical pathology EXAM: CT CERVICAL SPINE WITHOUT CONTRAST TECHNIQUE: Multidetector CT imaging of the cervical spine was performed without intravenous contrast. Multiplanar CT image  reconstructions were also generated. RADIATION DOSE REDUCTION: This exam was performed according to the departmental dose-optimization program which includes automated exposure control, adjustment of the mA and/or kV according to patient size and/or use of iterative reconstruction technique. COMPARISON:  None Available. FINDINGS: Alignment: No listhesis. Straightening of the normal cervical lordosis, which may be positional. Skull base and vertebrae: No acute fracture. No primary bone lesion or focal pathologic process. Soft tissues and  spinal canal: No prevertebral fluid or swelling. No visible canal hematoma. Disc levels: Disc heights are preserved. No significant degenerative changes. No significant spinal canal stenosis or neural foraminal narrowing. Upper chest: No focal pulmonary opacity or pleural effusion. Other: Endotracheal and orogastric tubes noted. IMPRESSION: No acute or chronic abnormality in the cervical spine. Electronically Signed   By: Merilyn Baba M.D.   On: 03/20/2022 03:00   CT ANGIO HEAD NECK W WO CM W PERF (CODE STROKE)  Result Date: 03/20/2022 CLINICAL DATA:  Stroke suspected, extremity numbness and weakness EXAM: CT ANGIOGRAPHY HEAD AND NECK CT PERFUSION TECHNIQUE: Multidetector CT imaging of the head and neck was performed using the standard protocol during bolus administration of intravenous contrast. Multiplanar CT image reconstructions and MIPs were obtained to evaluate the vascular anatomy. Carotid stenosis measurements (when applicable) are obtained utilizing NASCET criteria, using the distal internal carotid diameter as the denominator. Multiphase CT imaging of the brain was performed following IV bolus contrast injection. Subsequent parametric perfusion maps were calculated using RAPID software. RADIATION DOSE REDUCTION: This exam was performed according to the departmental dose-optimization program which includes automated exposure control, adjustment of the mA and/or kV  according to patient size and/or use of iterative reconstruction technique. CONTRAST:  80 mL Omnipaque 300 COMPARISON:  No prior CTA, correlation is made with CT head 03/20/2022 FINDINGS: CT HEAD FINDINGS For noncontrast findings, please see same day CT head. CTA NECK FINDINGS Aortic arch: Two-vessel arch with a common origin of the brachiocephalic and left common carotid arteries. Imaged portion shows no evidence of aneurysm or dissection. No significant stenosis of the major arch vessel origins. Right carotid system: No evidence of stenosis, dissection, or occlusion. Left carotid system: No evidence of stenosis, dissection, or occlusion. Vertebral arteries: No evidence of stenosis, dissection, or occlusion. Skeleton: No acute osseous abnormality.  No visible canal hematoma. Other neck: Endotracheal and orogastric tubes noted. Otherwise negative. Upper chest: Negative. Review of the MIP images confirms the above findings CTA HEAD FINDINGS Anterior circulation: Both internal carotid arteries are patent to the termini, without significant stenosis. A1 segments patent. Normal anterior communicating artery. Anterior cerebral arteries are patent to their distal aspects. No M1 stenosis or occlusion. MCA branches perfused and symmetric. Posterior circulation: Vertebral arteries patent to the vertebrobasilar junction without stenosis. Posterior inferior cerebellar arteries patent proximally. Basilar patent to its distal aspect. Superior cerebellar arteries patent proximally. Patent P1 segments. PCAs perfused to their distal aspects without stenosis. The bilateral posterior communicating arteries are diminutive but patent. Venous sinuses: As permitted by contrast timing, patent. Anatomic variants: None significant. Review of the MIP images confirms the above findings CT Brain Perfusion Findings: ASPECTS: 10 CBF (<30%) Volume: 2m Perfusion (Tmax>6.0s) volume: 0263mMismatch Volume: 63m39mnfarction Location:None IMPRESSION: 1.  No intracranial large vessel occlusion or significant stenosis. 2. No hemodynamically significant stenosis in the neck. 3. No evidence of infarct core or penumbra on CT perfusion. Electronically Signed   By: AliMerilyn BabaD.   On: 03/20/2022 02:43   CT HEAD CODE STROKE WO CONTRAST  Result Date: 03/20/2022 CLINICAL DATA:  Code stroke. EXAM: CT HEAD WITHOUT CONTRAST TECHNIQUE: Contiguous axial images were obtained from the base of the skull through the vertex without intravenous contrast. RADIATION DOSE REDUCTION: This exam was performed according to the departmental dose-optimization program which includes automated exposure control, adjustment of the mA and/or kV according to patient size and/or use of iterative reconstruction technique. COMPARISON:  03/16/2022 FINDINGS: Brain: No evidence of acute infarction,  hemorrhage, mass, mass effect, or midline shift. No hydrocephalus or extra-axial collection. Vascular: No hyperdense vessel. Skull: Negative for fracture or focal lesion. Sinuses/Orbits: No acute finding. Other: The mastoid air cells are well aerated. ASPECTS [facility] Stroke Program Early CT Score) - Ganglionic level infarction (caudate, lentiform nuclei, internal capsule, insula, M1-M3 cortex): 7 - Supraganglionic infarction (M4-M6 cortex): 3 Total score (0-10 with 10 being normal): 10 IMPRESSION: No evidence of acute intracranial abnormality. ASPECTS is 10. Imaging results were communicated on 03/20/2022 at 2:19 am to provider Dr. Lorrin Goodell via secure text paging. Electronically Signed   By: Merilyn Baba M.D.   On: 03/20/2022 02:19   DG CHEST PORT 1 VIEW  Result Date: 03/20/2022 CLINICAL DATA:  Check gastric catheter placement EXAM: PORTABLE CHEST 1 VIEW COMPARISON:  Film from earlier in the same day. FINDINGS: Endotracheal tube is noted in satisfactory position. Gastric catheter extends into the stomach. Lungs are well aerated bilaterally. Mild left basilar atelectasis and small effusion is  noted. No other focal abnormality is seen. IMPRESSION: Tubes and lines in satisfactory position. Mild left basilar atelectasis. Electronically Signed   By: Inez Catalina M.D.   On: 03/20/2022 00:12   DG Chest Portable 1 View  Result Date: 03/23/2022 CLINICAL DATA:  OG tube placement EXAM: PORTABLE CHEST 1 VIEW COMPARISON:  03/23/2022, 8:44 p.m. FINDINGS: Similar position of endotracheal tube, positioned over the midtrachea. Similar position of esophagogastric tube, malpositioned, tip over the trachea. Unchanged elevation of the left hemidiaphragm. Heart and mediastinum unremarkable. Osseous structures unremarkable. IMPRESSION: 1. Similar position of esophagogastric tube, malpositioned, tip over the trachea. Recommend repositioning to ensure positioned within the esophagus. 2. Similar position of endotracheal tube, positioned over the midtrachea. These results will be called to the ordering clinician or representative by the Radiologist Assistant, and communication documented in the PACS or [employer]. Electronically Signed   By: Delanna Ahmadi M.D.   On: 03/10/2022 21:33   DG Chest Portable 1 View  Result Date: 03/29/2022 CLINICAL DATA:  Post intubation. EXAM: PORTABLE CHEST 1 VIEW COMPARISON:  03/18/2022. FINDINGS: Heart is enlarged and the mediastinal contour is within normal limits. No consolidation, effusion, or pneumothorax. A soft tissue density is present over the left lung field, likely reflecting breast tissue. The endotracheal tube terminates 5.1 cm above the carina. An enteric tube terminates in the mid esophagus and should be advanced approximately 26 cm. IMPRESSION: 1. No active disease. 2. An enteric tube terminates in the proximal to mid esophagus and should be advanced 26 cm. 3. Endotracheal tube terminates 5.1 cm above the carina. Electronically Signed   By: Brett Fairy M.D.   On: 03/04/2022 20:57   DG Chest Portable 1 View  Result Date: 03/17/2022 CLINICAL DATA:  Cough weakness  EXAM: PORTABLE CHEST 1 VIEW COMPARISON:  None Available. FINDINGS: Hazy opacity at the left base. Right lung is clear. Normal cardiac size. No pneumothorax IMPRESSION: Hazy opacity at the left base may reflect atelectasis or mild pneumonia. Electronically Signed   By: Donavan Foil M.D.   On: 03/22/2022 18:56   CT HEAD WO CONTRAST  Result Date: 03/30/2022 CLINICAL DATA:  Weakness headache unable to move arms and legs EXAM: CT HEAD WITHOUT CONTRAST TECHNIQUE: Contiguous axial images were obtained from the base of the skull through the vertex without intravenous contrast. RADIATION DOSE REDUCTION: This exam was performed according to the departmental dose-optimization program which includes automated exposure control, adjustment of the mA and/or kV according to patient size and/or use of iterative  reconstruction technique. COMPARISON:  None Available. FINDINGS: Brain: No evidence of acute infarction, hemorrhage, hydrocephalus, extra-axial collection or mass lesion/mass effect. Vascular: No hyperdense vessel or unexpected calcification. Skull: Normal. Negative for fracture or focal lesion. Sinuses/Orbits: No acute finding. Other: None IMPRESSION: Negative non contrasted CT appearance of the brain. Electronically Signed   By: Donavan Foil M.D.   On: 03/14/2022 18:55     PHYSICAL EXAM  Temp:  [89.2 F (31.8 C)-100 F (37.8 C)] 99.9 F (37.7 C) (02/17 1745) Pulse Rate:  [38-90] 65 (02/17 1745) Resp:  [14-25] 20 (02/17 1745) BP: (82-149)/(39-99) 105/57 (02/17 1745) SpO2:  [72 %-100 %] 94 % (02/17 1745) FiO2 (%):  [40 %-100 %] 60 % (02/17 1600) Weight:  [73.9 kg-90.7 kg] 73.9 kg (02/17 0000)  General - Well nourished, well developed, in no apparent distress, on vent.  Ophthalmologic - fundi not visualized due to noncooperation.  Cardiovascular - Regular rhythm and rate.  Neuro - drowsy on low dose propofol and fentanyl.  However with voice, patient able to open eyes, maintain wakefulness.   Nonverbal but able to mouth answers, fully orientated, no aphasia, following all simple commands. Able to name and repeat. No gaze palsy, tracking bilaterally, visual field full, PERRL.  Facial symmetry not able to test due to ET tube.  Tongue protrusion midline.  Neck on the left turning position, not able to move by request.  Bilateral UEs and LEs 0/5, no movement. Sensation symmetrical bilaterally subjectively, b/l DTR diminished, no Babinski, gait not tested.     ASSESSMENT/PLAN [name] is a 22 y.o. female with history of anxiety, depression, smoker with birth control depot, alcohol abuse, THC user admitted for acute arm and leg weakness, respiratory failure, nausea vomiting difficulty speaking but able to mouth words. No tPA given due to outside window.    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  MRI C-spine signal change at anterior aspect of the spinal cord from the craniocervical junction to the level of C7, concerning for acute spinal cord ischemia in an anterior spinal artery pattern. 2D Echo  EF 55-60% LE venous doppler pending May consider TEE for further stroke work up given stroke in such young age LDL 111 HgbA1c 5.3 Hypercoagulable work up pending Autoimmune work up pending ESR/CRP/RPR/HIV neg B12 = 193, relatively low Heparin subq for VTE prophylaxis No antithrombotic prior to admission, now on aspirin 81 mg daily. Not on DAPT for now given possible trach  Ongoing aggressive stroke risk factor management Therapy recommendations:  pending Disposition:  pending  BP management BP goal <180/105 Avoid  low BP Long term BP goal normotensive  Hyperlipidemia Home meds:  none  LDL 111, goal < 70 Now on crestor 20 (although pt child bearing age, but will not in pregnancy for the foreseeable future) Continue statin at discharge  Birth control depot Daily smoker Pt had birth control depot implanted 2 years ago Daily smoker, 1PPD Smoking cessation counseling provided Will consult OB to remove birth control depot in the setting of stroke  Alcohol abuse Daily drinker, at least half pint Will educate on alcohol limitation CIWA protocol FA/B1/MVI  B12 deficiency B12 = 193, relatively low  end of normal B12 supplement  Other Stroke Risk Factors Daily THC user, will education on cessation   Other Active Problems Back pain due to scoliosis, on Percocet Xanax as needed for anxiety/depression  Hospital day # 1  This patient is 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 This patient's care requires constant monitoring of vital signs, hemodynamics, respiratory and cardiac monitoring, review of multiple databases, neurological assessment, discussion with family, other specialists and medical decision making of high complexity. I spent 30 minutes of neurocritical care time in the care of this patient. I had long discussion with boyfriend at bedside and mom over the phone, updated pt current condition, treatment plan and potential prognosis, and answered all the questions.  They expressed understanding and appreciation.  I also discussed with Dr. Carlis Abbott CCM.   Rosalin Hawking, MD PhD Stroke Neurology 03/20/2022 6:36 PM    To contact Stroke Continuity provider, please refer to [email or web address] After hours, contact General Neurology

## 2022-03-20 NOTE — Progress Notes (Signed)
Physical Therapy Evaluation Patient Details Name: Rachael [name] MRN: [id number] DOB: Jul 28, 2000 Today's Date: 03/20/2022  History of Present Illness  Rachael [name] is a 22 y.o. female admitted 03/22/2022 with acute arms and leg weakness, dyspnea and hypercabia.  (+) for polysubstances. Pt was intubated on 2/16.   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 Pt able to respond to questions using gaze on Yes and No signs. Pt with no motor function below the head, but has sensation bilaterally UE and LE. PROM limited to LE by increased edema. Movement improved with repetition. Pt will likely benefit from tilt bed to maintain tolerance to upright while working on motor recovery. Currently recommending LTACH due to intubation and level of movement, however hopeful for recovery of function and possibility of discharge to AIR. PT will continue to follow acutely.     Recommendations for follow up therapy are one component of a multi-disciplinary discharge planning process, led by the attending physician.  Recommendations may be updated based on patient status, additional functional criteria and insurance authorization.  Follow Up Recommendations Long-term institutional care without follow-up therapy Can patient physically be transported by private vehicle: No    Assistance Recommended at Discharge Frequent or constant Supervision/Assistance  Patient can return home with the following  Two people to help with walking and/or transfers;Two people to help with bathing/dressing/bathroom;Assistance with cooking/housework;Assistance with feeding;Direct supervision/assist for medications management;Direct supervision/assist for financial management;Assist for transportation;Help with stairs or ramp for  entrance    Equipment Recommendations  (TBD)     Functional Status Assessment Patient has had a recent decline in their functional status and demonstrates the ability to make significant improvements in function in a reasonable and predictable amount of time.     Precautions / Restrictions Restrictions Weight Bearing Restrictions: No      Mobility  Bed Mobility Overal bed mobility: Needs Assistance             General bed mobility comments: total A +2 for all aspects of bed mobility at this time. RN team has been doing excellent job of rotating/rolling for pressure relief    Transfers                   General transfer comment: NT at this time             Pertinent Vitals/Pain Pain Assessment Pain Assessment: Faces Faces Pain Scale: Hurts a little bit Muscle Tension: Tense, rigid (with initial PROM in all extremities) Pain Location: generalized joint pain/stiffness with initial ROM Pain Descriptors / Indicators: Grimacing Pain Intervention(s): Limited activity within patient's tolerance, Monitored during session, Repositioned    Home Living Family/patient expects to be discharged to:: Private residence Living Arrangements: Parent;Children (2 siblings 3rd and 5th grade, her own daughter is 2 y/o) Available Help at Discharge: Family Type of Home: Apartment Home Access: Other (comment) (ledge/threshold)       Home Layout: 1/2 bath on main level;Bed/bath upstairs Home Equipment: Hand held shower head Additional Comments: enjoys R&B, rap    Prior Function Prior Level of Function : Independent/Modified Independent                        Extremity/Trunk Assessment   Upper Extremity Assessment Upper Extremity Assessment: Defer to OT evaluation RUE Deficits /  Details: flaccid, edematous, full PROM, initially slight grimace and stiff joints, but improved with gentle motion RUE Sensation: WNL RUE Coordination: decreased fine motor;decreased  gross motor LUE Deficits / Details: flaccid, edematous, full PROM, initially slight grimace and stiff joints, but improved with gentle motion LUE Sensation: WNL LUE Coordination: decreased fine motor;decreased gross motor    Lower Extremity Assessment Lower Extremity Assessment: RLE deficits/detail;LLE deficits/detail RLE Deficits / Details: flaccid, edema limiting full PROM RLE Sensation:  (present to light touch and proprioception, unsure to what level in comparison with baseline) LLE Deficits / Details: flaccid, edema limiting full PROM LLE Sensation:  (present to light touch and proprioception, unsure to what level in comparison with baseline)       Communication   Communication: Other (comment) (Intubated)  Cognition Arousal/Alertness: Lethargic (low sedation at time of eval) Behavior During Therapy: Flat affect Overall Cognitive Status: Difficult to assess                                 General Comments: Pt on vent, eye blinks were inconsistent        General Comments General comments (skin integrity, edema, etc.): VSS, intubated SpO2 93%O2 on FiO2 60%O2, communicated wih    Exercises Low Level/ICU Exercises Ankle Circles/Pumps: PROM, Right, Left, Both, 5 reps Hip ABduction/ADduction: PROM, Right, Left, Both, 5 reps Heel Slides: PROM, Right, Left, Both, 5 reps Other Exercises Other Exercises: hip flexion PROM both x5   Assessment/Plan    PT Assessment Patient needs continued PT services  PT Problem List Decreased mobility;Decreased strength;Decreased coordination       PT Treatment Interventions Therapeutic activities;Therapeutic exercise;Functional mobility training;Cognitive remediation;Balance training    PT Goals (Current goals can be found in the Care Plan section)  Acute Rehab PT Goals Patient Stated Goal: have intubation tube removed PT Goal Formulation: With patient/family Time For Goal Achievement: 04/02/22 Potential to Achieve Goals:  Poor    Frequency Min 2X/week     Co-evaluation PT/OT/SLP Co-Evaluation/Treatment: Yes Reason for Co-Treatment: Complexity of the patient's impairments (multi-system involvement);Necessary to address cognition/behavior during functional activity;For patient/therapist safety PT goals addressed during session: Strengthening/ROM OT goals addressed during session: ADL's and self-care;Strengthening/ROM (Cognition)       AM-PAC PT "6 Clicks" Mobility  Outcome Measure Help needed turning from your back to your side while in a flat bed without using bedrails?: Total Help needed moving from lying on your back to sitting on the side of a flat bed without using bedrails?: Total Help needed moving to and from a bed to a chair (including a wheelchair)?: Total Help needed standing up from a chair using your arms (e.g., wheelchair or bedside chair)?: Total Help needed to walk in hospital room?: Total Help needed climbing 3-5 steps with a railing? : Total 6 Click Score: 6    End of Session   Activity Tolerance: Patient limited by lethargy Patient left: in chair;with call bell/phone within reach;with family/visitor present Nurse Communication: Mobility status;Other (comment) (need for Prevalon boots) PT Visit Diagnosis: Muscle weakness (generalized) (M62.81);Other symptoms and signs involving the nervous system (R29.898)    Time: 1415-1450 PT Time Calculation (min) (ACUTE ONLY): 35 min   Charges:   PT Evaluation $PT Eval High Complexity: 1 High PT Treatments $Therapeutic Exercise: 8-22 mins        Jama Mcmiller B. Migdalia Dk PT, DPT Acute Rehabilitation Services Please use secure chat or  Call Office [phone]  [place] 03/20/2022, 4:44 PM

## 2022-03-20 NOTE — Progress Notes (Addendum)
Pt transported from [place] rm 2 to CT. Pt was then transported from CT to MRI and back to [place] Rm 2. No complications noted by RT x2.

## 2022-03-20 NOTE — Evaluation (Signed)
Occupational Therapy Evaluation Patient Details Name: Rachael [name] MRN: [id number] DOB: [birth year] Today's Date: 03/20/2022   History of Present Illness Rachael [name] is a 22 y.o. female admitted 03/04/2022 with acute arms and leg weakness, dyspnea and hypercabia.  (+) for polysubstances. Pt was intubated on 2/16.   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 Pt has a 9 year old daughter and enjoys listening to music (R&B, Rap). Today pt is total A +2 for all aspects of ADL and bed mobility. PROM performed with BUE and Pt through use of "yes" and "no" cards was able to communicate accurately through gaze. She also attempted to verbalize mouthing phrases that were not always able to be understood.  Pt's mother educated in PROM of BUE hands and elbow for edema management as well as elevation of heels to prevent pressure sore. Requested Prevlon boots through RN and at this recommending LTAC with hopes that she will progress to level where we can recommend AIR. OT will continue to follow acutely and continue to assess cognition, muscle function.      Recommendations for follow up therapy are one component of a multi-disciplinary discharge planning process, led by the attending physician.  Recommendations may be updated based on patient status, additional functional criteria and insurance authorization.   Follow Up Recommendations  OT at Long-term acute care hospital (with hopes to progress to AIR)     Assistance Recommended at Discharge Frequent or constant Supervision/Assistance  Patient can return home with the following Two people to help with walking and/or transfers;Two people to help with  bathing/dressing/bathroom;Assistance with cooking/housework;Assistance with feeding;Direct supervision/assist for medications management;Direct supervision/assist for financial management;Assist for transportation;Help with stairs or ramp for entrance    Functional Status Assessment  Patient has had a recent decline in their functional status and demonstrates the ability to make significant improvements in function in a reasonable and predictable amount of time.  Equipment Recommendations   (TBA)    Recommendations for Other Services PT consult;Speech consult     Precautions / Restrictions Restrictions Weight Bearing Restrictions: No      Mobility Bed Mobility Overal bed mobility: Needs Assistance             General bed mobility comments: total A +2 for all aspects of bed mobility at this time. RN team has been doing excellent job of rotating/rolling for pressure relief    Transfers                   General transfer comment: NT at this time      Balance                                           ADL either performed or assessed with clinical judgement   ADL Overall ADL's : Needs assistance/impaired                                       General ADL Comments: total A for all aspects of ADL at this time  Vision Baseline Vision/History: 0 No visual deficits Ability to See in Adequate Light: 0 Adequate Patient Visual Report: No change from baseline Additional Comments: using eyes to communicate, assume no issues. OT will continue to follow and assess     Perception Perception Perception Tested?: No   Praxis Praxis Praxis tested?: Not tested    Pertinent Vitals/Pain Pain Assessment Pain Assessment: Faces Faces Pain Scale: Hurts a little bit Facial Expression: Relaxed, neutral Body Movements: Absence of movements Muscle Tension: Tense, rigid (with intial PROM) Compliance with ventilator (intubated pts.):  Tolerating ventilator or movement Vocalization (extubated pts.): N/A CPOT Total: 1 Pain Location: generalized joint pain/stiffness with initial ROM Pain Descriptors / Indicators: Grimacing Pain Intervention(s): Monitored during session, Repositioned     Hand Dominance     Extremity/Trunk Assessment Upper Extremity Assessment Upper Extremity Assessment: Defer to OT evaluation RUE Deficits / Details: flaccid, edematous, full PROM, initially slight grimace and stiff joints, but improved with gentle motion RUE Sensation: WNL RUE Coordination: decreased fine motor;decreased gross motor LUE Deficits / Details: flaccid, edematous, full PROM, initially slight grimace and stiff joints, but improved with gentle motion LUE Sensation: WNL LUE Coordination: decreased fine motor;decreased gross motor   Lower Extremity Assessment Lower Extremity Assessment: RLE deficits/detail;LLE deficits/detail RLE Deficits / Details: PROM WFL RLE Sensation:  (present to light touch and proprioception, unsure to what level in comparison with baseline) LLE Sensation:  (present to light touch and proprioception, unsure to what level in comparison with baseline)       Communication Communication Communication: Other (comment) (Intubated)   Cognition Arousal/Alertness: Lethargic (low sedation at time of eval) Behavior During Therapy: Flat affect Overall Cognitive Status: Difficult to assess                                 General Comments: Pt on vent, eye blinks were inconsistent, Pt did better with yes no cards for communicating. Pt also frequently attempting to mouth but often unable to interpret     General Comments  VSS, intubated SpO2 93%O2 on FiO2 60%O2, communicated wih    Exercises Exercises: Low Level/ICU Low Level/ICU Exercises Shoulder Flexion: PROM, Right, Left, 10 reps, Supine Elbow Flexion: PROM, Right, Left, 10 reps, Supine Other Exercises Other Exercises: hand/digit PROM  Bil Hands x10 Other Exercises: shoulder abduction BUE x10 PROM   Shoulder Instructions      Home Living Family/patient expects to be discharged to:: Private residence Living Arrangements: Parent;Children (2 siblings 3rd and 5th grade, her own daughter is 2 y/o) Available Help at Discharge: Family Type of Home: Apartment Home Access: Other (comment) (ledge/threshold)     Home Layout: 1/2 bath on main level;Bed/bath upstairs     Bathroom Shower/Tub: Teacher, early years/pre: Standard Bathroom Accessibility: Yes How Accessible: Accessible via walker Home Equipment: Hand held shower head   Additional Comments: enjoys R&B, rap  Lives With: Family    Prior Functioning/Environment Prior Level of Function : Independent/Modified Independent                        OT Problem List: Decreased strength;Decreased range of motion;Decreased activity tolerance;Impaired balance (sitting and/or standing);Decreased coordination;Decreased knowledge of use of DME or AE;Cardiopulmonary status limiting activity;Obesity;Impaired UE functional use;Increased edema      OT Treatment/Interventions: Self-care/ADL training;Therapeutic exercise;Neuromuscular education;DME and/or AE instruction;Manual therapy;Therapeutic activities;Patient/family education;Balance training    OT Goals(Current goals can be found in the  care plan section) Acute Rehab OT Goals Patient Stated Goal: get breathing tube out OT Goal Formulation: With patient Time For Goal Achievement: 04/03/22 Potential to Achieve Goals: Good  OT Frequency: Min 2X/week    Co-evaluation PT/OT/SLP Co-Evaluation/Treatment: Yes Reason for Co-Treatment: Complexity of the patient's impairments (multi-system involvement);Necessary to address cognition/behavior during functional activity;For patient/therapist safety PT goals addressed during session: Strengthening/ROM OT goals addressed during session: ADL's and  self-care;Strengthening/ROM (Cognition)      AM-PAC OT "6 Clicks" Daily Activity     Outcome Measure Help from another person eating meals?: Total Help from another person taking care of personal grooming?: Total Help from another person toileting, which includes using toliet, bedpan, or urinal?: Total Help from another person bathing (including washing, rinsing, drying)?: Total Help from another person to put on and taking off regular upper body clothing?: Total Help from another person to put on and taking off regular lower body clothing?: Total 6 Click Score: 6   End of Session Equipment Utilized During Treatment: Other (comment) (intubated) Nurse Communication: Precautions;Need for lift equipment  Activity Tolerance: Patient tolerated treatment well Patient left: in bed;with call bell/phone within reach;with family/visitor present  OT Visit Diagnosis: Other abnormalities of gait and mobility (R26.89);Muscle weakness (generalized) (M62.81);Other symptoms and signs involving the nervous system (R29.898)                Time: 1415-1450 OT Time Calculation (min): 35 min Charges:  OT General Charges $OT Visit: 1 Visit OT Evaluation $OT Eval High Complexity: 1 High  Jesse Sans OTR/L Acute Rehabilitation Services Office: [place] 03/20/2022, 4:35 PM

## 2022-03-20 NOTE — Progress Notes (Signed)
Initial Nutrition Assessment  DOCUMENTATION CODES:   Not applicable  INTERVENTION:   Tube Feeds via OG-tube: Switch to Vital 1.5 at 50 mL/hr (1200 mL/day)  60 mL ProSource TF20 - BID Provides 1960 kcal, 121 grams of protein, and 917 mL free water daily.  NUTRITION DIAGNOSIS:   Inadequate oral intake related to inability to eat as evidenced by NPO status.  GOAL:   Patient will meet greater than or equal to 90% of their needs  MONITOR:   Vent status, TF tolerance, I & O's, Labs  REASON FOR ASSESSMENT:   Consult Enteral/tube feeding initiation and management  ASSESSMENT:   22 y.o. female presented to the ED with shortness of breath and lower extremity weakness. PMH includes depression. Pt admitted with acute paralysis, hypothermia, and hypoxic/hypercarbic respiratory failure.   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  Discussed with RN. Currently has Vital HP infusing at 40 mL/hr. RD to adjust order to switch to Vial 1.5 when the Vital HP bottle runs out.   Patient is currently intubated on ventilator support. OG in place. MV: 9.6 L/min MAP (cuff): 68 Temp (24hrs), Avg:93.9 F (34.4 C), Min:88.9 F (31.6 C), Max:99.9 F (37.7 C)  Drips Fentanyl Propofol: 8.16 ml/hr (215 kcal/day)  Medications reviewed and include: Zithromax, Colace, Pepcid, Novolog SSI, NovoLog SSI, Miralax Labs reviewed: Sodium 136, Potassium 4.5, Hgb A1c 5.3%, 24 hr CBGs 93-129  UOP: 1150 mL x 24 hrs  NUTRITION - FOCUSED PHYSICAL EXAM:  Deferred to follow-up.  Diet Order:   Diet Order             Diet NPO time specified  Diet effective now                   EDUCATION NEEDS:   Not appropriate for education at this time  Skin:  Skin Assessment: Reviewed RN Assessment  Last BM:  PTA  Height:   Ht Readings from Last 1 Encounters:  03/13/2022 5' 7"$  (1.702 m)    Weight:   Wt Readings from Last 1 Encounters:   03/20/22 73.9 kg    Ideal Body Weight:  61.4 kg  BMI:  Body mass index is 25.52 kg/m.  Estimated Nutritional Needs:  Kcal:  1850-2050 Protein:  95-115 grams Fluid:  >/= 1.8 L   Hermina Barters RD, LDN Clinical Dietitian See [facility] for contact information.

## 2022-03-20 NOTE — Consult Note (Signed)
NEUROLOGY CONSULTATION NOTE   Date of service: March 20, 2022 Patient Name: Rachael [name] MRN:  [id number] DOB:  2000/06/09 Reason for consult: "Generalized weakness, acute onset and unable to move arms and legs, intubated for hypercarbic respiratory failure" Requesting Provider: Collier Bullock, MD _ _ _   _ __   _ __ _ _  __ __   _ __   __ _  History of Present Illness  Rachael [name] is a 22 y.o. female with PMH significant for depression, HLD, Kidney abscess who presents with acute arms and leg weakness, dyspnea and hypercabia. And hypothermic to 88.31F on arrival. She presented to [facility] triage short of breath, weak and floppy, cold and pale.   Mom reports that Rachael [name] lives with her. Mom has not seen [name] for a few days. [name] has a 55 year old baby. Patient returned home in the afternoon today and seemed fine. She was sitting on the couch with mom and watching TV. Patient decided to lay down on the couch and proceeded to lie down and seemed to be moving her arms and legs fine and laid down by herself. Patient then reported that her hands and feet felt numb. This was around 1630 on 03/24/2022. Mom was with the patient. Since then, she has not moved her arms and legs at all. She was initially able to talk. Patient asked mom to call her kid's father and requested that he drive her to the ED. She threw up on her way in the car. She was brought in to [facility] and she was put up on high dose oxygen at 15 lpm, ABG with hypercarbia which worsened on repeat ABG. She was noted to have no gag when suctioning. She was able to mouth words but unable to speak, unable to move extremities. She was intubated and eventuall transferred to ICU.  Workup with CTH with no acute intracranial abnormalities. Of note, heart rate seemed to have dropped to 38 and propofol stopped.  UDS positive for opiates, benzos, THC. She was severely hypothermic to Tmin of 88.9 on arrival to [place].  Neurology was consulted for further evaluation and workup. I evaluated the patient at around 135AM and upon discussion with mom and obtaining further history, decided to activate a code stroke to rule out basilar artery thrombosis vs an anterior spinal artery infarct.  LKW: 1630 on 03/30/2022. mRS: 0 tNKASE: not offered, outside window at the time of my evaluation. Thrombectomy: not offered, unfortunately, no LVO. Discussed with Dr. Karenann Cai briefly over phone given noted MRI findings concerning for anterior spinal artery infarct. This is not amenable to intervention and I agree with her assessment. NIHSS components Score: Comment  1a Level of Conscious 0[x]$  1[]$  2[]$  3[]$      1b LOC Questions 0[]$  1[]$  2[x]$       1c LOC Commands 0[x]$  1[]$  2[]$       2 Best Gaze 0[x]$  1[]$  2[]$       3 Visual 0[x]$  1[]$  2[]$  3[]$      4 Facial Palsy 0[x]$  1[]$  2[]$  3[]$      5a Motor Arm - left 0[]$  1[]$  2[]$  3[]$  4[x]$  UN[]$    5b Motor Arm - Right 0[]$  1[]$  2[]$  3[]$  4[x]$  UN[]$    6a Motor Leg - Left 0[]$  1[]$  2[]$  3[]$  4[x]$  UN[]$    6b Motor Leg - Right 0[]$  1[]$  2[]$  3[]$  4[x]$  UN[]$    7 Limb Ataxia 0[x]$  1[]$  2[]$  3[]$  UN[]$     8 Sensory 0[]$  1[]$   2$[x]L$  UN[]$      9 Best Language 0[]$  1[]$  2[]$  3[x]$      10 Dysarthria 0[]$  1[]$  2[x]$  UN[]$      11 Extinct. and Inattention 0[x]$  1[]$  2[]$       TOTAL: 25     ROS   Unable to obtain ROS 2/2 intubation.  Past History   Past Medical History:  Diagnosis Date   Depression    few yrs ago ([place]), doing good now   Hyperlipidemia    Kidney abscess    Past Surgical History:  Procedure Laterality Date   NO PAST SURGERIES     Family History  Problem Relation Age of Onset   Asthma Mother    Hypertension Other    Hyperlipidemia Maternal Grandmother    Hypertension Maternal Grandmother    Diabetes Paternal Grandmother    Social History   Socioeconomic History   Marital status: Single    Spouse name: Not on file   Number of children: Not on file   Years of education: Not on file   Highest  education level: Not on file  Occupational History   Not on file  Tobacco Use   Smoking status: Former    Packs/day: 0.33    Years: 3.00    Total pack years: 0.99    Types: Cigarettes    Quit date: 04/02/2019    Years since quitting: 2.9    Passive exposure: Current   Smokeless tobacco: Never  Vaping Use   Vaping Use: Never used  Substance and Sexual Activity   Alcohol use: No   Drug use: Yes    Frequency: 7.0 times per week    Types: Marijuana    Comment: last was today   Sexual activity: Yes    Birth control/protection: None  Other Topics Concern   Not on file  Social History Narrative   Not on file   Social Determinants of Health   Financial Resource Strain: Not on file  Food Insecurity: Not on file  Transportation Needs: Not on file  Physical Activity: Not on file  Stress: Not on file  Social Connections: Not on file   No Known Allergies  Medications   Medications Prior to Admission  Medication Sig Dispense Refill Last Dose   acetaminophen (TYLENOL) 325 MG tablet Take 2 tablets (650 mg total) by mouth every 4 (four) hours as needed (for pain scale < 4).      cetirizine (ZYRTEC ALLERGY) 10 MG tablet Take 1 tablet (10 mg total) by mouth daily. 30 tablet 0    docusate sodium (COLACE) 250 MG capsule Take 1 capsule (250 mg total) by mouth daily. (Patient taking differently: Take 250 mg by mouth daily as needed for constipation. ) 30 capsule 0    ferrous sulfate (FERROUSUL) 325 (65 FE) MG tablet Take 1 tablet (325 mg total) by mouth every other day. 30 tablet 2    ibuprofen (ADVIL) 800 MG tablet Take 1 tablet (800 mg total) by mouth every 8 (eight) hours as needed for up to 21 doses for fever, headache, mild pain or moderate pain. 21 tablet 0    ondansetron (ZOFRAN ODT) 4 MG disintegrating tablet Take 1 tablet (4 mg total) by mouth every 8 (eight) hours as needed for nausea or vomiting. 30 tablet 0    predniSONE (DELTASONE) 20 MG tablet Take 2 tablets daily with  breakfast. 10 tablet 0    Prenat-FeCbn-FeAsp-Meth-FA-DHA (PRENATE MINI) 18-0.6-0.4-350 MG CAPS Take 1 tablet by mouth daily. 30 capsule 10  Prenatal Vit-Fe Phos-FA-Omega (VITAFOL GUMMIES) 3.33-0.333-34.8 MG CHEW Chew 3 each by mouth daily. (Patient not taking: Reported on 11/18/2019) 90 tablet 11    promethazine-dextromethorphan (PROMETHAZINE-DM) 6.25-15 MG/5ML syrup Take 2.5 mLs by mouth 3 (three) times daily as needed for cough. 100 mL 0      Vitals   Vitals:   03/20/22 0030 03/20/22 0045 03/20/22 0100 03/20/22 0115  BP: 120/84 124/78 121/81 118/76  Pulse: (!) 56 (!) 54 (!) 52 (!) 56  Resp: 20 20 20 20  $ Temp: (!) 93.4 F (34.1 C) (!) 93.6 F (34.2 C) (!) 93.9 F (34.4 C) (!) 94.3 F (34.6 C)  TempSrc:      SpO2: 100% 100% 100% 100%  Weight:      Height:         Body mass index is 25.52 kg/m.  Physical Exam performed after propofol and fentanyl was paused briefly.   General: Laying comfortably in bed; in no acute distress.  HENT: Normal oropharynx and mucosa. Normal external appearance of ears and nose.  Neck: Supple, no pain or tenderness  CV: No JVD. No peripheral edema.  Pulmonary: Symmetric Chest rise. Not breathing over vent Abdomen: Soft to touch, non-tender.  Ext: No cyanosis, edema, or deformity  Skin: No rash. Normal palpation of skin.   Musculoskeletal: Normal digits and nails by inspection. No clubbing.   Neurologic Examination  Mental status/Cognition: Alert, makes eye contact. Mouths words. Unable to answer orientation questions secondary to intubation. Speech/language: intubated but blinks on commands, sticks tongue out on command. No speech, comprehension intact. Cranial nerves:   CN II Pupils equal and reactive to light, no VF deficits   CN III,IV,VI Saccadic pursuits, EOMI intact, no dysconjugate gaze, no gaze deviation   CN V normal sensation in V1, V2, and V3 segments bilaterally   CN VII no asymmetry, no nasolabial fold flattening   CN VIII Makes  eye contact to speech   CN IX & X No gag, cough weakly positive   CN XI Head midline   CN XII midline tongue protrusion but only protrudes it a bit.   Motor:  Muscle bulk: normal, tone flaccid in all extremities. No movement in any extremities to pinch.  Reflexes:  Right Left Comments  Pectoralis      Biceps (C5/6) 0 0   Brachioradialis (C5/6) 0 0    Triceps (C6/7) 0 0    Patellar (L3/4) 0 0    Achilles (S1) 0 0    Hoffman      Plantar mute mute   Jaw jerk    Sensation:  Light touch Intact in all extremities.   Pin prick    Temperature    Vibration Intact in all extremities.  Proprioception    Coordination/Complex Motor:  Unable to assess. Gait: unsafe to assess.  Labs   CBC:  Recent Labs  Lab 03/04/2022 1825 03/13/2022 1919 03/06/2022 2210 03/20/22 0107  WBC 18.3*  --   --   --   NEUTROABS 15.0*  --   --   --   HGB 13.8   < > 12.9 12.2  HCT 41.5   < > 38.0 36.0  MCV 84.3  --   --   --   PLT 299  --   --   --    < > = values in this interval not displayed.    Basic Metabolic Panel:  Lab Results  Component Value Date   NA 136 03/20/2022   K 4.5 03/20/2022  CO2 22 03/31/2022   GLUCOSE 113 (H) 03/05/2022   BUN 7 03/04/2022   CREATININE 0.79 03/11/2022   CALCIUM 9.7 03/11/2022   GFRNONAA >60 03/29/2022   GFRAA >60 04/17/2019   Lipid Panel:  Lab Results  Component Value Date   LDLCALC 125 (H) 04/03/2016   HgbA1c:  Lab Results  Component Value Date   HGBA1C 5.3 04/03/2016   Urine Drug Screen:     Component Value Date/Time   LABOPIA POSITIVE (A) 03/22/2022 1825   COCAINSCRNUR NONE DETECTED 03/11/2022 1825   LABBENZ POSITIVE (A) 03/27/2022 1825   AMPHETMU NONE DETECTED 03/20/2022 1825   THCU POSITIVE (A) 03/15/2022 1825   LABBARB NONE DETECTED 03/24/2022 1825    Alcohol Level     Component Value Date/Time   ETH <10 03/08/2022 1825    CT Head without contrast(Personally reviewed): CTH was negative for a large hypodensity concerning for a  large territory infarct or hyperdensity concerning for an ICH  CT angio Head and Neck with contrast(Personally reviewed): No LVO  MRI Brain(Personally reviewed): No acute stroke in the brain. However, reviewing the small portion of Cervical spine and caudal medulla visible in the DWI images on MRI brain demonstrates diffusion restriction with ADC correlation in the anteromedial spinal cord and caudal medulla which is concerning for an anterior spinal artery infarct. I called and spoke with Neuro rads and they confirmed that this is concerning for a spinal code stroke  MRI C spine w + w/o C with DWI images: Confirms Anterior spinal artery stroke.  Impression   Esveidy Helliwell is a 22 y.o. female with PMH significant for depression, HLD, Kidney abscess who presents with acute arms and leg weakness, dyspnea and hypercabia. And hypothermic to 88.57F on arrival. She presented to [facility] triage short of breath, weak and floppy, cold and pale. Found to be hypothermic, no gag and was intubated. She was transferred to [facility] ICU for further evaluation.  Exam with flaccid paralysis of all extremities with absent reflexes, absent gag, weak cough, saccadic dysmettria, gaze evoked nystagmus with horizontal and vertical gaze. Despite this, she has intact sensation to light touch and vibration in all extremities. Makes eye contact and blinks to respond to questions.  History provided by mother and exam highly concerning for either brainstem infarct or spinal cord stroke and thus a code stroke was activated to expedite workup and rule out basilar thrombosis.  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  Etiology of stroke is unclear. She is outside window for tnkase at the time of my evaluation and an anterior spinal artery infarct is not amenable to mechanical thrombectomy. I discussed with Dr. Karenann Cai over phone to confirm this too, specially given how young Mr. Pross is.  Recommendations  - Frequent Neuro checks per stroke unit protocol - Recommend obtaining TTE - Recommend obtaining Lipid panel with LDL - Please start statin if LDL > 70 - Recommend HbA1c to evaluate for diabetes and how well it is controlled. - Antithrombotic - Aspirin 375m suppository daily. - Recommend DVT ppx - SBP goal - permissive hypertension first 24 h < 220/110. Held home HTN meds. - IV Fluids at 1071mhour x 2 days. - Recommend Telemetry monitoring for arrythmia - Recommend bedside swallow screen prior to PO intake. - Stroke education booklet - Recommend PT/OT/SLP consult - stroke team to follow along.  ______________________________________________________________________  I discussed MRI findings with patient's  mother at bedside extensively and answered all of her questions. Patient is drowsy 2/2 sedation and unfortunately not in a position to understand this comprehensive conversation at this time. Will need to discuss with her in AM.  This patient is critically ill and at significant risk of neurological worsening, death and care requires constant monitoring of vital signs, hemodynamics,respiratory and cardiac monitoring, neurological assessment, discussion with family, other specialists and medical decision making of high complexity. I spent 110 minutes of neurocritical care time  in the care of  this patient. This was time spent independent of any time provided by nurse practitioner or PA.  Donnetta Simpers [facility] Pager Number [phone] 03/20/2022  5:10 AM   Thank you for the opportunity to take part in the care of this patient. If you have any further questions, please contact the neurology consultation attending.  Signed,  [place] Pager Number [phone] _ _ _   _ __   _ __ _ _  __ __   _ __   __ _

## 2022-03-21 ENCOUNTER — Inpatient Hospital Stay (HOSPITAL_COMMUNITY): Payer: Medicaid Other

## 2022-03-21 DIAGNOSIS — I639 Cerebral infarction, unspecified: Secondary | ICD-10-CM

## 2022-03-21 DIAGNOSIS — G839 Paralytic syndrome, unspecified: Secondary | ICD-10-CM | POA: Diagnosis not present

## 2022-03-21 DIAGNOSIS — J9602 Acute respiratory failure with hypercapnia: Secondary | ICD-10-CM | POA: Diagnosis not present

## 2022-03-21 DIAGNOSIS — G9511 Acute infarction of spinal cord (embolic) (nonembolic): Secondary | ICD-10-CM | POA: Diagnosis not present

## 2022-03-21 DIAGNOSIS — J189 Pneumonia, unspecified organism: Secondary | ICD-10-CM | POA: Diagnosis not present

## 2022-03-21 DIAGNOSIS — J9601 Acute respiratory failure with hypoxia: Secondary | ICD-10-CM | POA: Diagnosis not present

## 2022-03-21 DIAGNOSIS — E8729 Other acidosis: Secondary | ICD-10-CM | POA: Diagnosis not present

## 2022-03-21 DIAGNOSIS — Z9911 Dependence on respirator [ventilator] status: Secondary | ICD-10-CM | POA: Diagnosis not present

## 2022-03-21 LAB — POCT I-STAT 7, (LYTES, BLD GAS, ICA,H+H)
Acid-base deficit: 6 mmol/L — ABNORMAL HIGH (ref 0.0–2.0)
Acid-base deficit: 6 mmol/L — ABNORMAL HIGH (ref 0.0–2.0)
Acid-base deficit: 2 mmol/L (ref 0.0–2.0)
Acid-base deficit: 3 mmol/L — ABNORMAL HIGH (ref 0.0–2.0)
Bicarbonate: 23.3 mmol/L (ref 20.0–28.0)
Bicarbonate: 23.1 mmol/L (ref 20.0–28.0)
Bicarbonate: 21.1 mmol/L (ref 20.0–28.0)
Bicarbonate: 22.7 mmol/L (ref 20.0–28.0)
Calcium, Ion: 1.3 mmol/L (ref 1.15–1.40)
Calcium, Ion: 1.31 mmol/L (ref 1.15–1.40)
Calcium, Ion: 1.2 mmol/L (ref 1.15–1.40)
Calcium, Ion: 1.27 mmol/L (ref 1.15–1.40)
HCT: 35 % — ABNORMAL LOW (ref 36.0–46.0)
HCT: 34 % — ABNORMAL LOW (ref 36.0–46.0)
HCT: 31 % — ABNORMAL LOW (ref 36.0–46.0)
HCT: 34 % — ABNORMAL LOW (ref 36.0–46.0)
Hemoglobin: 11.9 g/dL — ABNORMAL LOW (ref 12.0–15.0)
Hemoglobin: 11.6 g/dL — ABNORMAL LOW (ref 12.0–15.0)
Hemoglobin: 10.5 g/dL — ABNORMAL LOW (ref 12.0–15.0)
Hemoglobin: 11.6 g/dL — ABNORMAL LOW (ref 12.0–15.0)
O2 Saturation: 99 %
O2 Saturation: 100 %
O2 Saturation: 94 %
O2 Saturation: 89 %
Patient temperature: 98.6
Patient temperature: 37.5
Patient temperature: 37.7
Potassium: 4.5 mmol/L (ref 3.5–5.1)
Potassium: 4.2 mmol/L (ref 3.5–5.1)
Potassium: 3.5 mmol/L (ref 3.5–5.1)
Potassium: 3.8 mmol/L (ref 3.5–5.1)
Sodium: 139 mmol/L (ref 135–145)
Sodium: 139 mmol/L (ref 135–145)
Sodium: 138 mmol/L (ref 135–145)
Sodium: 140 mmol/L (ref 135–145)
TCO2: 25 mmol/L (ref 22–32)
TCO2: 25 mmol/L (ref 22–32)
TCO2: 22 mmol/L (ref 22–32)
TCO2: 24 mmol/L (ref 22–32)
pCO2 arterial: 61.5 mmHg — ABNORMAL HIGH (ref 32–48)
pCO2 arterial: 61.5 mmHg — ABNORMAL HIGH (ref 32–48)
pCO2 arterial: 31.5 mmHg — ABNORMAL LOW (ref 32–48)
pCO2 arterial: 40.4 mmHg (ref 32–48)
pH, Arterial: 7.186 — CL (ref 7.35–7.45)
pH, Arterial: 7.187 — CL (ref 7.35–7.45)
pH, Arterial: 7.436 (ref 7.35–7.45)
pH, Arterial: 7.359 (ref 7.35–7.45)
pO2, Arterial: 163 mmHg — ABNORMAL HIGH (ref 83–108)
pO2, Arterial: 345 mmHg — ABNORMAL HIGH (ref 83–108)
pO2, Arterial: 71 mmHg — ABNORMAL LOW (ref 83–108)
pO2, Arterial: 59 mmHg — ABNORMAL LOW (ref 83–108)

## 2022-03-21 LAB — GLUCOSE, CAPILLARY
Glucose-Capillary: 142 mg/dL — ABNORMAL HIGH (ref 70–99)
Glucose-Capillary: 107 mg/dL — ABNORMAL HIGH (ref 70–99)
Glucose-Capillary: 122 mg/dL — ABNORMAL HIGH (ref 70–99)
Glucose-Capillary: 107 mg/dL — ABNORMAL HIGH (ref 70–99)
Glucose-Capillary: 131 mg/dL — ABNORMAL HIGH (ref 70–99)
Glucose-Capillary: 148 mg/dL — ABNORMAL HIGH (ref 70–99)

## 2022-03-21 LAB — BASIC METABOLIC PANEL
Anion gap: 9 (ref 5–15)
BUN: 7 mg/dL (ref 6–20)
CO2: 20 mmol/L — ABNORMAL LOW (ref 22–32)
Calcium: 8.4 mg/dL — ABNORMAL LOW (ref 8.9–10.3)
Chloride: 109 mmol/L (ref 98–111)
Creatinine, Ser: 0.8 mg/dL (ref 0.44–1.00)
GFR, Estimated: >60 mL/min (ref >60)
Glucose, Bld: 103 mg/dL — ABNORMAL HIGH (ref 70–99)
Potassium: 4.4 mmol/L (ref 3.5–5.1)
Sodium: 138 mmol/L (ref 135–145)

## 2022-03-21 LAB — RPR: RPR Ser Ql: NONREACTIVE

## 2022-03-21 LAB — ANTITHROMBIN III: AntiThromb III Func: 116 % (ref 75–120)

## 2022-03-21 LAB — PROCALCITONIN: Procalcitonin: <0.1 ng/mL

## 2022-03-21 LAB — PHOSPHORUS: Phosphorus: 3.4 mg/dL (ref 2.5–4.6)

## 2022-03-21 LAB — MAGNESIUM: Magnesium: 2.3 mg/dL (ref 1.7–2.4)

## 2022-03-21 MED ORDER — IPRATROPIUM-ALBUTEROL 0.5-2.5 (3) MG/3ML IN SOLN
3.0000 mL | Freq: Four times a day (QID) | RESPIRATORY_TRACT | Status: DC | PRN
  Administered 2022-03-21 – 2022-04-05 (×11): 3 mL via RESPIRATORY_TRACT
  Filled 2022-03-21 (×12): qty 3

## 2022-03-21 MED ORDER — HYOSCYAMINE SULFATE 0.125 MG SL SUBL
0.1250 mg | SUBLINGUAL_TABLET | SUBLINGUAL | Status: DC | PRN
  Administered 2022-03-21: 0.125 mg via SUBLINGUAL
  Filled 2022-03-21 (×2): qty 1

## 2022-03-21 MED ORDER — SODIUM CHLORIDE 3 % IN NEBU
4.0000 mL | INHALATION_SOLUTION | RESPIRATORY_TRACT | Status: AC
  Administered 2022-03-21 – 2022-03-24 (×17): 4 mL via RESPIRATORY_TRACT
  Filled 2022-03-21 (×21): qty 4

## 2022-03-21 NOTE — Progress Notes (Signed)
STROKE TEAM PROGRESS NOTE   SUBJECTIVE (INTERVAL HISTORY) Her RN is at the bedside.  Mom just stepped out and not in the waiting room. Pt continues to have quadriplegia and neck movement deficit, fully awake alert, mouth words, wanted the tube out.    OBJECTIVE Temp:  [99 F (37.2 C)-100 F (37.8 C)] 99.9 F (37.7 C) (02/18 1200) Pulse Rate:  [53-95] 57 (02/18 1200) Cardiac Rhythm: Normal sinus rhythm (02/18 0800) Resp:  [10-29] 28 (02/18 1200) BP: (95-127)/(48-78) 127/75 (02/18 1200) SpO2:  [90 %-100 %] 96 % (02/18 1217) FiO2 (%):  [60 %-100 %] 70 % (02/18 1127) Weight:  [75 kg] 75 kg (02/18 0400)  Recent Labs  Lab 03/20/22 1942 03/20/22 2339 03/21/22 0356 03/21/22 0727 03/21/22 1052  GLUCAP 126* 121* 142* 107* 122*   Recent Labs  Lab 03/24/2022 1825 03/29/2022 1919 03/20/22 1010 03/21/22 0127 03/21/22 0339 03/21/22 0359 03/21/22 1140  NA 136   < > 138 139 138 139 138  K 3.9   < > 3.8 4.5 4.4 4.2 3.5  CL 106  --  110  --  109  --   --   CO2 22  --  20*  --  20*  --   --   GLUCOSE 113*  --  102*  --  103*  --   --   BUN 7  --  <5*  --  7  --   --   CREATININE 0.79  --  0.76  --  0.80  --   --   CALCIUM 9.7  --  8.0*  --  8.4*  --   --   MG  --   --  1.8  --  2.3  --   --   PHOS  --   --  3.7  --  3.4  --   --    < > = values in this interval not displayed.   Recent Labs  Lab 03/12/2022 1825  AST 14*  ALT 14  ALKPHOS 74  BILITOT 0.6  PROT 7.6  ALBUMIN 4.6   Recent Labs  Lab 03/31/2022 1825 03/15/2022 1919 03/20/22 0107 03/20/22 1010 03/21/22 0127 03/21/22 0359 03/21/22 1140  WBC 18.3*  --   --  13.5*  --   --   --   NEUTROABS 15.0*  --   --   --   --   --   --   HGB 13.8   < > 12.2 11.1* 11.9* 11.6* 10.5*  HCT 41.5   < > 36.0 34.9* 35.0* 34.0* 31.0*  MCV 84.3  --   --  87.5  --   --   --   PLT 299  --   --  221  --   --   --    < > = values in this interval not displayed.   No results for input(s): "CKTOTAL", "CKMB", "CKMBINDEX", "TROPONINI" in the  last 168 hours. Recent Labs    03/22/2022 1825  LABPROT 12.6  INR 1.0   Recent Labs    03/10/2022 1825  COLORURINE YELLOW  LABSPEC 1.033*  PHURINE 6.5  GLUCOSEU NEGATIVE  HGBUR NEGATIVE  BILIRUBINUR NEGATIVE  KETONESUR 40*  PROTEINUR 30*  NITRITE NEGATIVE  LEUKOCYTESUR NEGATIVE       Component Value Date/Time   CHOL 176 03/20/2022 1010   TRIG 138 03/20/2022 1010   TRIG 140 03/20/2022 1010   HDL 37 (L) 03/20/2022 1010   CHOLHDL 4.8 03/20/2022 1010  VLDL 28 03/20/2022 1010   LDLCALC 111 (H) 03/20/2022 1010   Lab Results  Component Value Date   HGBA1C 5.3 03/20/2022      Component Value Date/Time   LABOPIA POSITIVE (A) 03/09/2022 1825   COCAINSCRNUR NONE DETECTED 03/05/2022 1825   LABBENZ POSITIVE (A) 03/12/2022 1825   AMPHETMU NONE DETECTED 03/06/2022 1825   THCU POSITIVE (A) 03/31/2022 1825   LABBARB NONE DETECTED 03/20/2022 1825    Recent Labs  Lab 03/11/2022 1825  ETH <10    I have personally reviewed the radiological images below and agree with the radiology interpretations.  DG Chest Port 1 View  Result Date: 03/21/2022 CLINICAL DATA:  Intubation.  Respiratory distress. EXAM: PORTABLE CHEST 1 VIEW COMPARISON:  Chest radiograph dated 03/20/2022. FINDINGS: Endotracheal tube with tip approximately 4 cm above the carina. Enteric tube extends below the diaphragm with tip beyond the inferior margin of the image. Improved aeration of the left lower lobe since the earlier radiograph. Minimal right base atelectasis. No pleural effusion or pneumothorax. The cardiac silhouette is within limits. No acute osseous pathology. IMPRESSION: 1. Endotracheal tube above the carina. 2. Improved aeration of the left lower lobe. Electronically Signed   By: Anner Crete M.D.   On: 03/21/2022 01:10   ECHOCARDIOGRAM COMPLETE  Result Date: 03/20/2022    ECHOCARDIOGRAM REPORT   Patient Name:   Rachael [name] Date of Exam: 03/20/2022 Medical Rec #:  [id number]        Height:       67.0  in Accession #:    [id number]       Weight:       162.9 lb Date of Birth:  [birth year]         BSA:          1.853 m Patient Age:    22 years         BP:           100/52 mmHg Patient Gender: F                HR:           63 bpm. Exam Location:  Inpatient Procedure: 2D Echo, Cardiac Doppler, Color Doppler and Strain Analysis Indications:    Stroke I63.9  History:        Patient has no prior history of Echocardiogram examinations.  Sonographer:    Alva Referring Phys: [id number] [facility]  Sonographer Comments: Echo performed with patient supine and on artificial respirator. IMPRESSIONS  1. Left ventricular ejection fraction, by estimation, is 55 to 60%. The left ventricle has normal function. The left ventricle has no regional wall motion abnormalities. Left ventricular diastolic parameters were normal. The average left ventricular global longitudinal strain is -25.9 %. The global longitudinal strain is normal.  2. Right ventricular systolic function is normal. The right ventricular size is normal. There is normal pulmonary artery systolic pressure.  3. The mitral valve is normal in structure. No evidence of mitral valve regurgitation. No evidence of mitral stenosis.  4. The aortic valve is tricuspid. Aortic valve regurgitation is not visualized. No aortic stenosis is present.  5. The inferior vena cava is normal in size with greater than 50% respiratory variability, suggesting right atrial pressure of 3 mmHg. FINDINGS  Left Ventricle: Left ventricular ejection fraction, by estimation, is 55 to 60%. The left ventricle has normal function. The left ventricle has no regional wall motion abnormalities. The average left ventricular global longitudinal strain is -25.9 %.  The global longitudinal strain is normal. The left ventricular internal cavity size was normal in size. There is no left ventricular hypertrophy. Left ventricular diastolic parameters were normal. Right Ventricle: The right ventricular  size is normal. No increase in right ventricular wall thickness. Right ventricular systolic function is normal. There is normal pulmonary artery systolic pressure. The tricuspid regurgitant velocity is 2.35 m/s, and  with an assumed right atrial pressure of 8 mmHg, the estimated right ventricular systolic pressure is 0000000 mmHg. Left Atrium: Left atrial size was normal in size. Right Atrium: Right atrial size was normal in size. Pericardium: There is no evidence of pericardial effusion. Mitral Valve: The mitral valve is normal in structure. No evidence of mitral valve regurgitation. No evidence of mitral valve stenosis. Tricuspid Valve: The tricuspid valve is normal in structure. Tricuspid valve regurgitation is mild . No evidence of tricuspid stenosis. Aortic Valve: The aortic valve is tricuspid. Aortic valve regurgitation is not visualized. No aortic stenosis is present. Pulmonic Valve: The pulmonic valve was normal in structure. Pulmonic valve regurgitation is not visualized. No evidence of pulmonic stenosis. Aorta: The aortic root is normal in size and structure. Venous: The inferior vena cava is normal in size with greater than 50% respiratory variability, suggesting right atrial pressure of 3 mmHg. IAS/Shunts: No atrial level shunt detected by color flow Doppler.  LEFT VENTRICLE PLAX 2D LVIDd:         4.70 cm   Diastology LVIDs:         2.60 cm   LV e' medial:    9.25 cm/s LV PW:         1.10 cm   LV E/e' medial:  8.8 LV IVS:        1.00 cm   LV e' lateral:   19.60 cm/s LVOT diam:     2.30 cm   LV E/e' lateral: 4.1 LV SV:         85 LV SV Index:   46        2D Longitudinal Strain LVOT Area:     4.15 cm  2D Strain GLS Avg:     -25.9 %  RIGHT VENTRICLE             IVC RV S prime:     12.60 cm/s  IVC diam: 2.00 cm TAPSE (M-mode): 2.2 cm LEFT ATRIUM             Index        RIGHT ATRIUM           Index LA diam:        3.40 cm 1.83 cm/m   RA Area:     14.90 cm LA Vol (A2C):   47.5 ml 25.63 ml/m  RA Volume:    38.50 ml  20.77 ml/m LA Vol (A4C):   29.8 ml 16.08 ml/m LA Biplane Vol: 39.9 ml 21.53 ml/m  AORTIC VALVE LVOT Vmax:   97.45 cm/s LVOT Vmean:  62.650 cm/s LVOT VTI:    0.206 m  AORTA Ao Root diam: 2.70 cm Ao Asc diam:  2.30 cm Ao Desc diam: 2.00 cm MITRAL VALVE               TRICUSPID VALVE MV Area (PHT): 2.91 cm    TR Peak grad:   22.1 mmHg MV Decel Time: 261 msec    TR Vmax:        235.00 cm/s MV E velocity: 81.20 cm/s MV A velocity: 52.70 cm/s  SHUNTS MV E/A ratio:  1.54        Systemic VTI:  0.21 m                            Systemic Diam: 2.30 cm Jenkins Rouge MD Electronically signed by Jenkins Rouge MD Signature Date/Time: 03/20/2022/11:01:55 AM    Final    DG CHEST PORT 1 VIEW  Result Date: 03/20/2022 CLINICAL DATA:  Shortness of breath EXAM: PORTABLE CHEST 1 VIEW COMPARISON:  03/20/2022 FINDINGS: Endotracheal tube with tip between the clavicular heads and carina. An enteric tube reaches the stomach. Artifact from EKG leads. Linear opacity in the right mid lung. Dense opacity with volume loss at the left base. No pneumothorax IMPRESSION: 1. Worsening left lower lobe aeration with volume loss. Mild atelectasis on the right. 2. Unremarkable hardware positioning. Electronically Signed   By: Jorje Guild M.D.   On: 03/20/2022 06:42   MR CERVICAL SPINE W WO CONTRAST  Result Date: 03/20/2022 CLINICAL DATA:  Myelopathy EXAM: MRI CERVICAL SPINE WITHOUT AND WITH CONTRAST TECHNIQUE: Multiplanar and multiecho pulse sequences of the cervical spine, to include the craniocervical junction and cervicothoracic junction, were obtained without and with intravenous contrast. CONTRAST:  7.5 mL Gadavist COMPARISON:  No prior MRI of the cervical spine, correlation is made with same day MRI head and CT cervical spine FINDINGS: Alignment: No listhesis. Vertebrae: No fracture, evidence of discitis, or bone lesion. Cord: Restricted diffusion with ADC correlate along the bilateral anterior aspect of the spinal cord from the  craniocervical junction (series 6, image 93) to the level of C7 (series 6, image 56). This correlates with increased T2 hyperintense signal, most likely in the anterior horn cells. Spinal cord is otherwise normal in caliber and signal. No abnormal enhancement. Posterior Fossa, vertebral arteries, paraspinal tissues: Negative. Disc levels: Preserved disc height and disc hydration. No spinal canal stenosis or neural foraminal narrowing. 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 These findings were discussed by telephone on 03/20/2022 at 3:30 is am with provider [facility] . Electronically Signed   By: Merilyn Baba M.D.   On: 03/20/2022 03:54   MR BRAIN WO CONTRAST  Result Date: 03/20/2022 CLINICAL DATA:  Altered mental status, stroke suspected EXAM: MRI HEAD WITHOUT CONTRAST TECHNIQUE: Multiplanar, multiecho pulse sequences of the brain and surrounding structures were obtained without intravenous contrast. COMPARISON:  None Available. FINDINGS: Brain: Restricted diffusion with ADC correlate in the anteromedial cervical spinal cord (series 5, image 54) at the craniocervical junction. No other restricted diffusion to suggest acute or subacute infarct. No acute hemorrhage, mass, mass effect, or midline shift. No hydrocephalus or extra-axial collection. Normal pituitary and craniocervical junction. No hemosiderin deposition to suggest remote hemorrhage. Normal cerebral volume for age. Vascular: Normal arterial flow voids. Skull and upper cervical spine: Normal marrow signal. Sinuses/Orbits: Clear paranasal sinuses. No acute finding in the orbits. Other: Trace fluid in the right mastoid air cells. IMPRESSION: 1. Restricted diffusion with ADC correlate in the anteromedial cervical spinal cord at the craniocervical junction,  concerning for acute spinal cord infarct. Attention on subsequent MRI cervical spine. 2. No other acute intracranial process. These findings were discussed by telephone on 03/20/2022 at 3:05 am with provider [facility] . Electronically Signed   By: Francetta Found.D.  On: 03/20/2022 03:14   CT C-SPINE NO CHARGE  Result Date: 03/20/2022 CLINICAL DATA:  Stroke code, concern for cervical pathology EXAM: CT CERVICAL SPINE WITHOUT CONTRAST TECHNIQUE: Multidetector CT imaging of the cervical spine was performed without intravenous contrast. Multiplanar CT image reconstructions were also generated. RADIATION DOSE REDUCTION: This exam was performed according to the departmental dose-optimization program which includes automated exposure control, adjustment of the mA and/or kV according to patient size and/or use of iterative reconstruction technique. COMPARISON:  None Available. FINDINGS: Alignment: No listhesis. Straightening of the normal cervical lordosis, which may be positional. Skull base and vertebrae: No acute fracture. No primary bone lesion or focal pathologic process. Soft tissues and spinal canal: No prevertebral fluid or swelling. No visible canal hematoma. Disc levels: Disc heights are preserved. No significant degenerative changes. No significant spinal canal stenosis or neural foraminal narrowing. Upper chest: No focal pulmonary opacity or pleural effusion. Other: Endotracheal and orogastric tubes noted. IMPRESSION: No acute or chronic abnormality in the cervical spine. Electronically Signed   By: Merilyn Baba M.D.   On: 03/20/2022 03:00   CT ANGIO HEAD NECK W WO CM W PERF (CODE STROKE)  Result Date: 03/20/2022 CLINICAL DATA:  Stroke suspected, extremity numbness and weakness EXAM: CT ANGIOGRAPHY HEAD AND NECK CT PERFUSION TECHNIQUE: Multidetector CT imaging of the head and neck was performed using the standard protocol during bolus administration of intravenous contrast. Multiplanar CT image  reconstructions and MIPs were obtained to evaluate the vascular anatomy. Carotid stenosis measurements (when applicable) are obtained utilizing NASCET criteria, using the distal internal carotid diameter as the denominator. Multiphase CT imaging of the brain was performed following IV bolus contrast injection. Subsequent parametric perfusion maps were calculated using RAPID software. RADIATION DOSE REDUCTION: This exam was performed according to the departmental dose-optimization program which includes automated exposure control, adjustment of the mA and/or kV according to patient size and/or use of iterative reconstruction technique. CONTRAST:  80 mL Omnipaque 300 COMPARISON:  No prior CTA, correlation is made with CT head 03/20/2022 FINDINGS: CT HEAD FINDINGS For noncontrast findings, please see same day CT head. CTA NECK FINDINGS Aortic arch: Two-vessel arch with a common origin of the brachiocephalic and left common carotid arteries. Imaged portion shows no evidence of aneurysm or dissection. No significant stenosis of the major arch vessel origins. Right carotid system: No evidence of stenosis, dissection, or occlusion. Left carotid system: No evidence of stenosis, dissection, or occlusion. Vertebral arteries: No evidence of stenosis, dissection, or occlusion. Skeleton: No acute osseous abnormality.  No visible canal hematoma. Other neck: Endotracheal and orogastric tubes noted. Otherwise negative. Upper chest: Negative. Review of the MIP images confirms the above findings CTA HEAD FINDINGS Anterior circulation: Both internal carotid arteries are patent to the termini, without significant stenosis. A1 segments patent. Normal anterior communicating artery. Anterior cerebral arteries are patent to their distal aspects. No M1 stenosis or occlusion. MCA branches perfused and symmetric. Posterior circulation: Vertebral arteries patent to the vertebrobasilar junction without stenosis. Posterior inferior cerebellar  arteries patent proximally. Basilar patent to its distal aspect. Superior cerebellar arteries patent proximally. Patent P1 segments. PCAs perfused to their distal aspects without stenosis. The bilateral posterior communicating arteries are diminutive but patent. Venous sinuses: As permitted by contrast timing, patent. Anatomic variants: None significant. Review of the MIP images confirms the above findings CT Brain Perfusion Findings: ASPECTS: 10 CBF (<30%) Volume: 341m Perfusion (Tmax>6.0s) volume: 061mMismatch Volume: 41m241mnfarction Location:None IMPRESSION: 1. No intracranial large vessel occlusion or significant  stenosis. 2. No hemodynamically significant stenosis in the neck. 3. No evidence of infarct core or penumbra on CT perfusion. Electronically Signed   By: Merilyn Baba M.D.   On: 03/20/2022 02:43   CT HEAD CODE STROKE WO CONTRAST  Result Date: 03/20/2022 CLINICAL DATA:  Code stroke. EXAM: CT HEAD WITHOUT CONTRAST TECHNIQUE: Contiguous axial images were obtained from the base of the skull through the vertex without intravenous contrast. RADIATION DOSE REDUCTION: This exam was performed according to the departmental dose-optimization program which includes automated exposure control, adjustment of the mA and/or kV according to patient size and/or use of iterative reconstruction technique. COMPARISON:  03/20/2022 FINDINGS: Brain: No evidence of acute infarction, hemorrhage, mass, mass effect, or midline shift. No hydrocephalus or extra-axial collection. Vascular: No hyperdense vessel. Skull: Negative for fracture or focal lesion. Sinuses/Orbits: No acute finding. Other: The mastoid air cells are well aerated. ASPECTS [facility] Stroke Program Early CT Score) - Ganglionic level infarction (caudate, lentiform nuclei, internal capsule, insula, M1-M3 cortex): 7 - Supraganglionic infarction (M4-M6 cortex): 3 Total score (0-10 with 10 being normal): 10 IMPRESSION: No evidence of acute intracranial abnormality.  ASPECTS is 10. Imaging results were communicated on 03/20/2022 at 2:19 am to provider Dr. Lorrin Goodell via secure text paging. Electronically Signed   By: Merilyn Baba M.D.   On: 03/20/2022 02:19   DG CHEST PORT 1 VIEW  Result Date: 03/20/2022 CLINICAL DATA:  Check gastric catheter placement EXAM: PORTABLE CHEST 1 VIEW COMPARISON:  Film from earlier in the same day. FINDINGS: Endotracheal tube is noted in satisfactory position. Gastric catheter extends into the stomach. Lungs are well aerated bilaterally. Mild left basilar atelectasis and small effusion is noted. No other focal abnormality is seen. IMPRESSION: Tubes and lines in satisfactory position. Mild left basilar atelectasis. Electronically Signed   By: Inez Catalina M.D.   On: 03/20/2022 00:12   DG Chest Portable 1 View  Result Date: 03/06/2022 CLINICAL DATA:  OG tube placement EXAM: PORTABLE CHEST 1 VIEW COMPARISON:  03/27/2022, 8:44 p.m. FINDINGS: Similar position of endotracheal tube, positioned over the midtrachea. Similar position of esophagogastric tube, malpositioned, tip over the trachea. Unchanged elevation of the left hemidiaphragm. Heart and mediastinum unremarkable. Osseous structures unremarkable. IMPRESSION: 1. Similar position of esophagogastric tube, malpositioned, tip over the trachea. Recommend repositioning to ensure positioned within the esophagus. 2. Similar position of endotracheal tube, positioned over the midtrachea. These results will be called to the ordering clinician or representative by the Radiologist Assistant, and communication documented in the PACS or [employer]. Electronically Signed   By: Delanna Ahmadi M.D.   On: 03/18/2022 21:33   DG Chest Portable 1 View  Result Date: 03/09/2022 CLINICAL DATA:  Post intubation. EXAM: PORTABLE CHEST 1 VIEW COMPARISON:  03/17/2022. FINDINGS: Heart is enlarged and the mediastinal contour is within normal limits. No consolidation, effusion, or pneumothorax. A soft tissue  density is present over the left lung field, likely reflecting breast tissue. The endotracheal tube terminates 5.1 cm above the carina. An enteric tube terminates in the mid esophagus and should be advanced approximately 26 cm. IMPRESSION: 1. No active disease. 2. An enteric tube terminates in the proximal to mid esophagus and should be advanced 26 cm. 3. Endotracheal tube terminates 5.1 cm above the carina. Electronically Signed   By: Brett Fairy M.D.   On: 03/16/2022 20:57   DG Chest Portable 1 View  Result Date: 03/08/2022 CLINICAL DATA:  Cough weakness EXAM: PORTABLE CHEST 1 VIEW COMPARISON:  None Available. FINDINGS:  Hazy opacity at the left base. Right lung is clear. Normal cardiac size. No pneumothorax IMPRESSION: Hazy opacity at the left base may reflect atelectasis or mild pneumonia. Electronically Signed   By: Donavan Foil M.D.   On: 03/16/2022 18:56   CT HEAD WO CONTRAST  Result Date: 03/23/2022 CLINICAL DATA:  Weakness headache unable to move arms and legs EXAM: CT HEAD WITHOUT CONTRAST TECHNIQUE: Contiguous axial images were obtained from the base of the skull through the vertex without intravenous contrast. RADIATION DOSE REDUCTION: This exam was performed according to the departmental dose-optimization program which includes automated exposure control, adjustment of the mA and/or kV according to patient size and/or use of iterative reconstruction technique. COMPARISON:  None Available. FINDINGS: Brain: No evidence of acute infarction, hemorrhage, hydrocephalus, extra-axial collection or mass lesion/mass effect. Vascular: No hyperdense vessel or unexpected calcification. Skull: Normal. Negative for fracture or focal lesion. Sinuses/Orbits: No acute finding. Other: None IMPRESSION: Negative non contrasted CT appearance of the brain. Electronically Signed   By: Donavan Foil M.D.   On: 03/17/2022 18:55     PHYSICAL EXAM  Temp:  [99 F (37.2 C)-100 F (37.8 C)] 99.9 F (37.7 C) (02/18  1200) Pulse Rate:  [53-95] 57 (02/18 1200) Resp:  [10-29] 28 (02/18 1200) BP: (95-127)/(48-78) 127/75 (02/18 1200) SpO2:  [90 %-100 %] 96 % (02/18 1217) FiO2 (%):  [60 %-100 %] 70 % (02/18 1127) Weight:  [75 kg] 75 kg (02/18 0400)  General - Well nourished, well developed, in no apparent distress, on vent.  Ophthalmologic - fundi not visualized due to noncooperation.  Cardiovascular - Regular rhythm and rate.  Neuro - awake alert, intubated nonverbal but able to mouth answers, fully orientated, no aphasia, following all simple commands. Able to name and repeat with mouthing words. No gaze palsy, tracking bilaterally, visual field full, PERRL.  Facial symmetry not able to test due to ET tube.  Tongue protrusion midline.  Neck on the left turning position, not able to move by request.  Bilateral UEs and LEs 0/5, no movement. Sensation symmetrical bilaterally subjectively, b/l DTR diminished, no Babinski, gait not tested.     ASSESSMENT/PLAN Ms. Vondra Pritt is a 22 y.o. female with history of anxiety, depression, smoker with birth control depot, alcohol abuse, THC user admitted for acute arm and leg weakness, respiratory failure, nausea vomiting difficulty speaking but able to mouth words. No tPA given due to outside window.    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  MRI C-spine signal change at anterior aspect of the spinal cord from the craniocervical junction to the level of C7, concerning for acute spinal cord ischemia in an anterior spinal artery pattern. 2D Echo  EF 55-60% LE venous doppler pending May consider TEE for  further stroke work up given stroke in such young age LDL 111 HgbA1c 5.3 Hypercoagulable work up pending Autoimmune work up pending ESR/CRP/RPR/HIV neg B12 = 193, relatively low Heparin subq for VTE prophylaxis No antithrombotic prior to admission, now on aspirin 81 mg daily. Not on DAPT for now given possible trach  Ongoing aggressive stroke risk factor management Therapy recommendations:  pending Disposition:  pending  BP management Stable  Avoid low BP Long term BP goal normotensive  Hyperlipidemia Home meds:  none  LDL 111, goal < 70 Now on crestor 20 (although pt child bearing age, but will not in pregnancy for the foreseeable future) Continue statin at discharge  Birth control depot Daily smoker Pt had birth control depot implanted 2 years ago Daily smoker, 1PPD Smoking cessation counseling provided Will consult OB to remove birth control depot in the setting of stroke  Alcohol abuse Daily drinker, at least half pint Will educate on alcohol limitation CIWA protocol FA/B1/MVI  B12 deficiency B12 = 193, relatively low end of normal B12 supplement  Other Stroke Risk Factors Daily THC user, will education on cessation   Other Active Problems Back pain due to scoliosis, on Percocet Xanax as needed for anxiety/depression  Hospital day # 2  This patient is 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 This patient's care requires constant monitoring of vital signs, hemodynamics, respiratory and cardiac monitoring, review of multiple databases, neurological assessment, discussion with family, other specialists and medical decision making of high complexity. I spent 30 minutes of neurocritical care time in the care of this patient.    Rachael Hawking, MD PhD Stroke Neurology 03/21/2022 12:37 PM    To contact Stroke Continuity  provider, please refer to [email or web address] After hours, contact General Neurology

## 2022-03-21 NOTE — Progress Notes (Signed)
NAME:  Rachael [name], MRN:  [id number], DOB:  [birth year], LOS: 2 ADMISSION DATE:  03/07/2022, CONSULTATION DATE:  03/21/22 REFERRING MD:  drawbridge, CHIEF COMPLAINT:  shortness of breath and weakness   History of Present Illness:  Rachael [name] is a 22 y.o. F with history of depression and suicidal ideation who presented to [place] ED with sudden onset of shortness of breath and upper and lower extremity weakness.  History taken from Epic as patient is intubated and no family is at the bedside.  She initially had a headache and back pain prior to onset but denied abdominal pain or chest pain.  Vitals showed an initial temp of 72F, bloodwork with WBC 18k, UDS + for bezos and THC.   Her symptoms progressed to the point she wasn't speaking and ABG showed hypercarbic respiratory failure so pt was intubated.  She had some bradycardia and hypotension post-intubation, so was started on peripheral Levophed and given atropine during transport   Pertinent  Medical History   has a past medical history of Depression, Hyperlipidemia, and Kidney abscess.   Significant Hospital Events: Including procedures, antibiotic start and stop dates in addition to other pertinent events   2/16 presented to [facility] with all weakness and shortness of breath requiring intubation and transfer to [facility]  Interim History / Subjective:  She complains of being uncomfortable with ETT. She wants her mouth swabbed.    Objective   Blood pressure 123/78, pulse 74, temperature 99.3 F (37.4 C), resp. rate (!) 28, height 5' 7"$  (1.702 m), weight 75 kg, SpO2 96 %.    Vent Mode: PRVC FiO2 (%):  [60 %-100 %] 80 % Set Rate:  [20 bmp-28 bmp] 28 bmp Vt Set:  [490 mL] 490 mL PEEP:  [5 cmH20-8 cmH20] 8 cmH20 Plateau Pressure:  [18 cmH20-22 cmH20] 21 cmH20   Intake/Output Summary (Last 24 hours) at 03/21/2022 0715 Last data filed at 03/21/2022 0600 Gross per 24 hour  Intake 1718.03 ml  Output 1520 ml  Net 198.03 ml    Filed  Weights   03/24/2022 2035 03/20/22 0000 03/21/22 0400  Weight: 90.7 kg 73.9 kg 75 kg    General:  critically ill appearing woman lying in bed in NAD HEENT: [place]/AT, eyes anicteric Neuro:  RASS -1, not able to turn her head, not moving extremities. Trying to talk, comprehension appears intact. Able to initiate breaths on the vent but low volume.  Psych: appears frustrated at inability to communicate. No cough with tracheal suctioning. CV: S1S2, RRR PULM:  bilateral rhales, trouble passing ballard, but no significant secretions. Pplat 25.  GI: soft, NT Extremities: no c/c/e Skin: warm, dry, no rashes  Blood cultures> NGTD PCT <0.1 RPR negative Folate WNL, B12 WNL CRP 0.7 ESR 3  7.187/62/345/23   CXR personally reviewed> left lower lobe opacity laterally with hemidiaphragm silhouetting  Echo: LVEF 55-60%, normal valves, no evidence of intracardiac clot or intracardiac shunt   Resolved Hospital Problem list     Assessment & Plan:   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 Acute hypercapnic and hypoxic respiratory failure due to NM weakness -Appreciate Neurology -removing Implanon contraceptive device -statin; worry eventually about teratogenic effect, but not currently an issue -aspirin, no DAPT due to potential need for procedures later this admission -may need TEE -Lyme serology, ANCA, hypercoagulability panels pending,  -prevent hypoxia, hyperglycemia, fevers -supportive care- frequent turns, start TF, CPT -trial of foley removal; at risk of neurogenic bladder  Acute hypoxic & hypercarbic respiratory  failure requiring MV due to NM weakness from C spine stroke. Acute hypercapnia with acute respiratory acidosis -LTVV -VAP prevention protocol -PAD protocol for sedation -failed SBT-- volumes <200cc on PS 10, plus FiO2 is too high to extubate -wean FiO2 as able -repeat ABG later this morning -worry she may require tracheostomy -worry she may need ETT  exchange    Hypothermia, bradycardia Shock likely neurogenic -off NE; can resume if MAP <65 -external warming PRN  Polysubstance abuse- tobacco, MJ. Has prescribed opiates and benzos. -will counsel on the importance of cessation when appropriate  Mother updated at bedside. Anticipate a long ICU course and hospital stay.  Best Practice (right click and "Reselect all SmartList Selections" daily)   Diet/type: tubefeeds DVT prophylaxis: prophylactic heparin  GI prophylaxis: PPI Lines: N/A Foley:  removal ordered  Code Status:  full code Last date of multidisciplinary goals of care discussion [ mother updated at bedside]  Labs   CBC: Recent Labs  Lab 03/14/2022 1825 03/20/2022 1919 03/12/2022 2210 03/20/22 0107 03/20/22 1010 03/21/22 0127 03/21/22 0359  WBC 18.3*  --   --   --  13.5*  --   --   NEUTROABS 15.0*  --   --   --   --   --   --   HGB 13.8   < > 12.9 12.2 11.1* 11.9* 11.6*  HCT 41.5   < > 38.0 36.0 34.9* 35.0* 34.0*  MCV 84.3  --   --   --  87.5  --   --   PLT 299  --   --   --  221  --   --    < > = values in this interval not displayed.     Basic Metabolic Panel: Recent Labs  Lab 03/15/2022 1825 03/07/2022 1919 03/22/2022 2210 03/20/22 0107 03/20/22 1010 03/21/22 0127 03/21/22 0339 03/21/22 0359  NA 136   < > 139 136 138 139  --  139  K 3.9   < > 5.1 4.5 3.8 4.5  --  4.2  CL 106  --   --   --  110  --   --   --   CO2 22  --   --   --  20*  --   --   --   GLUCOSE 113*  --   --   --  102*  --   --   --   BUN 7  --   --   --  <5*  --   --   --   CREATININE 0.79  --   --   --  0.76  --   --   --   CALCIUM 9.7  --   --   --  8.0*  --   --   --   MG  --   --   --   --  1.8  --  2.3  --   PHOS  --   --   --   --  3.7  --  3.4  --    < > = values in this interval not displayed.    GFR: Estimated Creatinine Clearance: 117.7 mL/min (by C-G formula based on SCr of 0.76 mg/dL). Recent Labs  Lab 03/05/2022 1825 03/11/2022 1829 03/20/22 0030 03/20/22 1010  03/21/22 0339  PROCALCITON  --   --   --  <0.10 <0.10  WBC 18.3*  --   --  13.5*  --   LATICACIDVEN  --  0.7 1.8  --   --      Liver Function Tests: Recent Labs  Lab 03/13/2022 1825  AST 14*  ALT 14  ALKPHOS 74  BILITOT 0.6  PROT 7.6  ALBUMIN 4.6     ABG    Component Value Date/Time   PHART 7.187 (LL) 03/21/2022 0359   PCO2ART 61.5 (H) 03/21/2022 0359   PO2ART 345 (H) 03/21/2022 0359   HCO3 23.1 03/21/2022 0359   TCO2 25 03/21/2022 0359   ACIDBASEDEF 6.0 (H) 03/21/2022 0359   O2SAT 100 03/21/2022 0359     Coagulation Profile: Recent Labs  Lab 03/30/2022 1825  INR 1.0    Critical care time:      This patient is critically ill with multiple organ system failure which requires frequent high complexity decision making, assessment, support, evaluation, and titration of therapies. This was completed through the application of advanced monitoring technologies and extensive interpretation of multiple databases. During this encounter critical care time was devoted to patient care services described in this note for 39 minutes.  Julian Hy, DO 03/21/22 8:41 AM [facility]  For contact information, see Amion. If no response to pager, please call PCCM consult pager. After hours, 7PM- 7AM, please call Elink.

## 2022-03-21 NOTE — Progress Notes (Signed)
[place] Progress Note Patient Name: Rachael [name] DOB: 09/23/2000 MRN: [id number]   Date of Service  03/21/2022  HPI/Events of Note  Patient having Sat probe issues, reading 87% on FIO2 70%, moved to multiple locations. BSRN asking for ABG for sat correlation, and possibly aline placement for frequent blood draws.  eICU Interventions  ABG ordered Will reassess need for aLine depending on ABG result     Intervention Category Major Interventions: Hypoxemia - evaluation and management  Judd Lien 03/21/2022, 9:15 PM

## 2022-03-21 NOTE — Progress Notes (Signed)
eLink Physician-Brief Progress Note Patient Name: Rachael [name] DOB: December 14, 2000 MRN: [id number]   Date of Service  03/21/2022  HPI/Events of Note  Per BSRN, patient having increase secretions. CXR this morning with improved aeration  eICU Interventions  Levsin prn ordered     Intervention Category Intermediate Interventions: Other:  Judd Lien 03/21/2022, 9:26 PM

## 2022-03-21 NOTE — Progress Notes (Signed)
VASCULAR LAB    Bilateral lower extremity venous duplex has been performed.  See CV proc for preliminary results.   Helton Oleson, RVT 03/21/2022, 4:28 PM

## 2022-03-21 NOTE — Progress Notes (Addendum)
eLink Physician-Brief Progress Note Patient Name: Rachael [name] DOB: 2000/05/17 MRN: [id number]   Date of Service  03/21/2022  HPI/Events of Note  Notified of hypoxia. O2 sat 84 on 70%/peep 5. Seen on camera. RN notes she started this night shift with fio2 of 60 and peep of 5. Desaturated and fio2 was raised to 70 and we were called. No distress on camera, sedated. Peak pressure 23. I am told she has good breath sounds on both sides, though coarse. Increased fio2 to 100 and peep to 8 ---> o2 sat 92  eICU Interventions  Will get CXR and ABG  D.w RN      Intervention Category Major Interventions: Respiratory failure - evaluation and management  Kaleisha Bhargava G Alecsander Hattabaugh 03/21/2022, 12:38 AM  Addendum at 1:50 am - ABG noted. I saw CXR, nothing major in the X ray. O2 sat is 97. RT had already increased RR to 28. Recheck ABG at 3 am. Will also give nebulizers.   Addendum at 4:25 am - Repeat ABG noted. Not improved in terms of hypercapnia which is strange. D/w RT. Already at set RR of 28. I am told lungs are clear . We already gave nebs. NO issues with passing suction catheter. No issues with secretions - mild clear secretions noted. Intermittent peak pressures of 30-31, not higher. Currently 27. I am not sure if there is an issue with the ETT. No issue with external tubing. I called Dr Duwayne Heck from [facility] and requested they evaluate her. Weaning Fio2 now that Po2 is 350.   Addendum at 6 am- was seen by ground team and noted to have intermittent biting of ETT so bite block being placed to prevent intermittent obstruction. Recheck abg at 8 am

## 2022-03-22 ENCOUNTER — Inpatient Hospital Stay (HOSPITAL_COMMUNITY): Payer: Medicaid Other

## 2022-03-22 ENCOUNTER — Inpatient Hospital Stay: Payer: Self-pay

## 2022-03-22 DIAGNOSIS — Z3009 Encounter for other general counseling and advice on contraception: Secondary | ICD-10-CM | POA: Diagnosis not present

## 2022-03-22 DIAGNOSIS — I63219 Cerebral infarction due to unspecified occlusion or stenosis of unspecified vertebral arteries: Secondary | ICD-10-CM

## 2022-03-22 DIAGNOSIS — Z7189 Other specified counseling: Secondary | ICD-10-CM

## 2022-03-22 DIAGNOSIS — E8729 Other acidosis: Secondary | ICD-10-CM | POA: Diagnosis not present

## 2022-03-22 DIAGNOSIS — J9612 Chronic respiratory failure with hypercapnia: Secondary | ICD-10-CM | POA: Diagnosis not present

## 2022-03-22 DIAGNOSIS — Z430 Encounter for attention to tracheostomy: Secondary | ICD-10-CM

## 2022-03-22 DIAGNOSIS — J9602 Acute respiratory failure with hypercapnia: Secondary | ICD-10-CM | POA: Diagnosis not present

## 2022-03-22 DIAGNOSIS — Z9911 Dependence on respirator [ventilator] status: Secondary | ICD-10-CM | POA: Diagnosis not present

## 2022-03-22 DIAGNOSIS — J9601 Acute respiratory failure with hypoxia: Secondary | ICD-10-CM | POA: Diagnosis not present

## 2022-03-22 DIAGNOSIS — J9611 Chronic respiratory failure with hypoxia: Secondary | ICD-10-CM

## 2022-03-22 LAB — CBC
HCT: 37 % (ref 36.0–46.0)
HCT: 34.4 % — ABNORMAL LOW (ref 36.0–46.0)
Hemoglobin: 12 g/dL (ref 12.0–15.0)
Hemoglobin: 11.2 g/dL — ABNORMAL LOW (ref 12.0–15.0)
MCH: 28.8 pg (ref 26.0–34.0)
MCH: 28.4 pg (ref 26.0–34.0)
MCHC: 32.4 g/dL (ref 30.0–36.0)
MCHC: 32.6 g/dL (ref 30.0–36.0)
MCV: 88.7 fL (ref 80.0–100.0)
MCV: 87.3 fL (ref 80.0–100.0)
Platelets: 177 10*3/uL (ref 150–400)
Platelets: 193 10*3/uL (ref 150–400)
RBC: 4.17 MIL/uL (ref 3.87–5.11)
RBC: 3.94 MIL/uL (ref 3.87–5.11)
RDW: 14.6 % (ref 11.5–15.5)
RDW: 14.6 % (ref 11.5–15.5)
WBC: 20 10*3/uL — ABNORMAL HIGH (ref 4.0–10.5)
WBC: 20.3 10*3/uL — ABNORMAL HIGH (ref 4.0–10.5)
nRBC: 0 % (ref 0.0–0.2)
nRBC: 0 % (ref 0.0–0.2)

## 2022-03-22 LAB — BASIC METABOLIC PANEL
Anion gap: 8 (ref 5–15)
BUN: 9 mg/dL (ref 6–20)
CO2: 25 mmol/L (ref 22–32)
Calcium: 8.6 mg/dL — ABNORMAL LOW (ref 8.9–10.3)
Chloride: 106 mmol/L (ref 98–111)
Creatinine, Ser: 0.62 mg/dL (ref 0.44–1.00)
GFR, Estimated: >60 mL/min (ref >60)
Glucose, Bld: 146 mg/dL — ABNORMAL HIGH (ref 70–99)
Potassium: 3.6 mmol/L (ref 3.5–5.1)
Sodium: 139 mmol/L (ref 135–145)

## 2022-03-22 LAB — GLUCOSE, CAPILLARY
Glucose-Capillary: 125 mg/dL — ABNORMAL HIGH (ref 70–99)
Glucose-Capillary: 131 mg/dL — ABNORMAL HIGH (ref 70–99)
Glucose-Capillary: 152 mg/dL — ABNORMAL HIGH (ref 70–99)
Glucose-Capillary: 121 mg/dL — ABNORMAL HIGH (ref 70–99)
Glucose-Capillary: 129 mg/dL — ABNORMAL HIGH (ref 70–99)
Glucose-Capillary: 275 mg/dL — ABNORMAL HIGH (ref 70–99)
Glucose-Capillary: 126 mg/dL — ABNORMAL HIGH (ref 70–99)

## 2022-03-22 LAB — LYME DISEASE SEROLOGY W/REFLEX: Lyme Total Antibody EIA: NEGATIVE

## 2022-03-22 LAB — ANA W/REFLEX IF POSITIVE: Anti Nuclear Antibody (ANA): NEGATIVE

## 2022-03-22 LAB — PROCALCITONIN: Procalcitonin: <0.1 ng/mL

## 2022-03-22 LAB — PHOSPHORUS: Phosphorus: 1.4 mg/dL — ABNORMAL LOW (ref 2.5–4.6)

## 2022-03-22 LAB — MAGNESIUM: Magnesium: 2.2 mg/dL (ref 1.7–2.4)

## 2022-03-22 MED ORDER — POLYETHYLENE GLYCOL 3350 17 G PO PACK: 17.0000 g | PACK | Freq: Two times a day (BID) | ORAL | Status: DC

## 2022-03-22 MED ORDER — MIDAZOLAM HCL 2 MG/2ML IJ SOLN
1.0000 mg | INTRAMUSCULAR | Status: DC | PRN
  Administered 2022-03-22 (×2): 2 mg via INTRAVENOUS
  Administered 2022-03-24 – 2022-03-25 (×2): 1 mg via INTRAVENOUS
  Administered 2022-03-26 – 2022-04-01 (×9): 2 mg via INTRAVENOUS
  Filled 2022-03-22 (×13): qty 2

## 2022-03-22 MED ORDER — PIPERACILLIN-TAZOBACTAM 3.375 G IVPB 30 MIN
3.3750 g | Freq: Once | INTRAVENOUS | Status: AC
  Administered 2022-03-22: 3.375 g via INTRAVENOUS
  Filled 2022-03-22 (×2): qty 50

## 2022-03-22 MED ORDER — SODIUM CHLORIDE 0.9 % IV SOLN: INTRAVENOUS | Status: DC

## 2022-03-22 MED ORDER — LIDOCAINE-EPINEPHRINE (PF) 2 %-1:200000 IJ SOLN
INTRAMUSCULAR | Status: AC
  Administered 2022-03-22: 2 mL
  Filled 2022-03-22: qty 20

## 2022-03-22 MED ORDER — ROCURONIUM BROMIDE 50 MG/5ML IV SOLN
100.0000 mg | Freq: Once | INTRAVENOUS | Status: AC
  Administered 2022-03-22: 100 mg via INTRAVENOUS
  Filled 2022-03-22: qty 10

## 2022-03-22 MED ORDER — POTASSIUM PHOSPHATES 15 MMOLE/5ML IV SOLN
45.0000 mmol | Freq: Once | INTRAVENOUS | Status: AC
  Administered 2022-03-22: 45 mmol via INTRAVENOUS
  Filled 2022-03-22: qty 15

## 2022-03-22 MED ORDER — DOCUSATE SODIUM 50 MG/5ML PO LIQD
100.0000 mg | Freq: Two times a day (BID) | ORAL | Status: DC | PRN
  Administered 2022-03-31: 100 mg
  Filled 2022-03-22: qty 10

## 2022-03-22 MED ORDER — POLYETHYLENE GLYCOL 3350 17 G PO PACK
17.0000 g | PACK | Freq: Every day | ORAL | Status: DC
  Filled 2022-03-22 (×2): qty 1

## 2022-03-22 MED ORDER — ETOMIDATE 2 MG/ML IV SOLN
20.0000 mg | Freq: Once | INTRAVENOUS | Status: AC
  Administered 2022-03-22: 20 mg via INTRAVENOUS
  Filled 2022-03-22: qty 10

## 2022-03-22 MED ORDER — SERTRALINE HCL 25 MG PO TABS
50.0000 mg | ORAL_TABLET | Freq: Every day | ORAL | Status: DC
  Administered 2022-03-22 – 2022-04-09 (×18): 50 mg
  Filled 2022-03-22 (×19): qty 2

## 2022-03-22 MED ORDER — SODIUM CHLORIDE 0.9% FLUSH
10.0000 mL | Freq: Two times a day (BID) | INTRAVENOUS | Status: DC
  Administered 2022-03-22: 10 mL
  Administered 2022-03-22: 20 mL
  Administered 2022-03-23 – 2022-03-25 (×6): 10 mL
  Administered 2022-03-26: 30 mL
  Administered 2022-03-26: 20 mL
  Administered 2022-03-27 – 2022-03-29 (×6): 10 mL
  Administered 2022-03-30: 20 mL
  Administered 2022-03-30 – 2022-04-05 (×11): 10 mL
  Administered 2022-04-07 – 2022-04-08 (×2): 20 mL
  Administered 2022-04-08 – 2022-04-09 (×2): 10 mL
  Administered 2022-04-09: 20 mL
  Administered 2022-04-10 – 2022-04-13 (×7): 10 mL
  Administered 2022-04-14: 20 mL
  Administered 2022-04-14 – 2022-04-15 (×2): 10 mL
  Administered 2022-04-15 – 2022-04-17 (×3): 20 mL
  Administered 2022-04-17: 10 mL
  Administered 2022-04-18: 20 mL

## 2022-03-22 MED ORDER — SODIUM CHLORIDE 0.9% FLUSH: 10.0000 mL | INTRAVENOUS | Status: DC | PRN

## 2022-03-22 MED ORDER — PIPERACILLIN-TAZOBACTAM 3.375 G IVPB
3.3750 g | Freq: Three times a day (TID) | INTRAVENOUS | Status: DC
  Administered 2022-03-22 – 2022-03-24 (×5): 3.375 g via INTRAVENOUS
  Filled 2022-03-22 (×5): qty 50

## 2022-03-22 MED ORDER — ENOXAPARIN SODIUM 40 MG/0.4ML IJ SOSY
40.0000 mg | PREFILLED_SYRINGE | Freq: Every day | INTRAMUSCULAR | Status: DC
  Administered 2022-03-23 – 2022-04-09 (×16): 40 mg via SUBCUTANEOUS
  Filled 2022-03-22 (×19): qty 0.4

## 2022-03-22 MED ORDER — MIDAZOLAM HCL 2 MG/2ML IJ SOLN
5.0000 mg | Freq: Once | INTRAMUSCULAR | Status: AC
  Administered 2022-03-22: 5 mg via INTRAVENOUS
  Filled 2022-03-22: qty 6

## 2022-03-22 MED ORDER — CLONAZEPAM 0.25 MG PO TBDP
0.5000 mg | ORAL_TABLET | Freq: Three times a day (TID) | ORAL | Status: DC
  Administered 2022-03-22 – 2022-04-18 (×76): 0.5 mg
  Filled 2022-03-22 (×78): qty 2

## 2022-03-22 MED ORDER — HEPARIN SODIUM (PORCINE) 5000 UNIT/ML IJ SOLN: 5000.0000 [IU] | Freq: Three times a day (TID) | INTRAMUSCULAR | Status: DC

## 2022-03-22 NOTE — Progress Notes (Signed)
OT Cancellation Note  Patient Details Name: Rachael [name] MRN: [id number] DOB: [birth year]   Cancelled Treatment:    Reason Eval/Treat Not Completed: Medical issues which prohibited therapy.  Continue efforts as appropriate.    Bridgitt Raggio D Axil Copeman 03/22/2022, 8:02 AM 03/22/2022  RP, OTR/L  Acute Rehabilitation Services  Office:  [phone]

## 2022-03-22 NOTE — Progress Notes (Addendum)
    Shared Decision Making/Informed Consent  The risks [esophageal damage, perforation (1:10,000 risk), bleeding, pharyngeal hematoma as well as other potential complications associated with conscious sedation including aspiration, arrhythmia, respiratory failure and death], benefits (treatment guidance and diagnostic support) and alternatives of a transesophageal echocardiogram were discussed in detail with Rachael [name] mother Rachael [name]  and she is willing to proceed.    NPO after midnight, HOLD all tube feeds if applicable, orders placed for TEE at bedside tomorrow.

## 2022-03-22 NOTE — Procedures (Addendum)
Percutaneous Tracheostomy Procedure Note   Rachael [name]  [id number]  10/31/2000  Date:03/22/22  Time:11:53 AM   Provider Performing:Lillyan Hitson C Tamala Julian  Procedure: Percutaneous Tracheostomy with Bronchoscopic Guidance ([id number])  Indication(s) Resp failure, quadripegia  Consent Risks of the procedure as well as the alternatives and risks of each were explained to the patient and/or caregiver.  Consent for the procedure was obtained.  Anesthesia Etomidate, Versed, Fentanyl, Rocuronium   Time Out Verified patient identification, verified procedure, site/side was marked, verified correct patient position, special equipment/implants available, medications/allergies/relevant history reviewed, required imaging and test results available.   Sterile Technique Maximal sterile technique including sterile barrier drape, hand hygiene, sterile gown, sterile gloves, mask, hair covering.    Procedure Description Appropriate anatomy identified by palpation.  Patient's neck prepped and draped in sterile fashion.  1% lidocaine with epinephrine was used to anesthetize skin overlying neck.  1.5cm incision made and blunt dissection performed until tracheal rings could be easily palpated.   Then a size 6-0 Shiley tracheostomy was placed under bronchoscopic visualization using usual Seldinger technique and serial dilation.   Bronchoscope confirmed placement above the carina.  Tracheostomy was sutured in place with adhesive pad to protect skin under pressure.    Patient connected to ventilator.   Complications/Tolerance None; patient tolerated the procedure well. Chest X-ray is ordered to confirm no post-procedural complication.   EBL Minimal   Specimen(s) None

## 2022-03-22 NOTE — Progress Notes (Signed)
Peripherally Inserted Central Catheter Placement  The IV Nurse has discussed with the patient and/or persons authorized to consent for the patient, the purpose of this procedure and the potential benefits and risks involved with this procedure.  The benefits include less needle sticks, lab draws from the catheter, and the patient may be discharged home with the catheter. Risks include, but not limited to, infection, bleeding, blood clot (thrombus formation), and puncture of an artery; nerve damage and irregular heartbeat and possibility to perform a PICC exchange if needed/ordered by physician.  Alternatives to this procedure were also discussed.  Bard Power PICC patient education guide, fact sheet on infection prevention and patient information card has been provided to patient /or left at bedside.    PICC Placement Documentation  PICC Double Lumen [id number] Left Basilic 44 cm 0 cm (Active)  Indication for Insertion or Continuance of Line Vasoactive infusions;Poor Vasculature-patient has had multiple peripheral attempts or PIVs lasting less than 24 hours 03/22/22 1100  Exposed Catheter (cm) 0 cm 03/22/22 1100  Site Assessment Clean, Dry, Intact 03/22/22 1100  Lumen #1 Status Flushed;Saline locked;Blood return noted 03/22/22 1100  Lumen #2 Status Flushed;Saline locked;Blood return noted 03/22/22 1100  Dressing Type Transparent;Securing device 03/22/22 1100  Dressing Status Antimicrobial disc in place;Clean, Dry, Intact 03/22/22 1100  Safety Lock Not Applicable [id number]  Line Adjustment (NICU/IV Team Only) No 03/22/22 1100  Dressing Intervention New dressing;Other (Comment) 03/22/22 1100  Dressing Change Due 03/29/22 03/22/22 1100    Patient's mother, Weston Settle, signed PICC consent at bedside prior to insertion.   Enos Fling 03/22/2022, 11:01 AM

## 2022-03-22 NOTE — Procedures (Signed)
Cortrak  Person Inserting Tube:  Maylon Peppers C, RD Tube Type:  Cortrak - 43 inches Tube Size:  10 Tube Location:  Left nare Secured by: Bridle Technique Used to Measure Tube Placement:  Marking at nare/corner of mouth Cortrak Secured At:  65 cm   Cortrak Tube Team Note:  Consult received to place a Cortrak feeding tube.   X-ray is required, abdominal x-ray has been ordered by the Cortrak team. Please confirm tube placement before using the Cortrak tube.   If the tube becomes dislodged please keep the tube and contact the Cortrak team at [email or web address] for replacement.  If after hours and replacement cannot be delayed, place a NG tube and confirm placement with an abdominal x-ray.    Lockie Pares., RD, LDN, CNSC See AMiON for contact information

## 2022-03-22 NOTE — IPAL (Signed)
  Interdisciplinary Goals of Care Family Meeting   Date carried out:: 03/22/2022  Location of the meeting: Conference room  Member's involved: Physician, Bedside Registered Nurse, Family Member or next of kin, and Other: NP Gilbert of Attorney or Loss adjuster, chartered:  mother     Discussion: We discussed goals of care for [employer] .  I met with Ms. Belin mother first then with Ms. Castetter in her room with her nurse Kenney Houseman and NP Moshe Cipro. She wants her ETT removed and we discussed that with her high oxygen requirements, there is not another way to give her that same amount of oxygen noninvasively, and I suspect due to NM weakness her saturations would drop quickly and she would likely pass away today. I encouraged them to consider tracheostomy as a short vs long-term measure to support her that would alleviate her ETT-associated discomfort and facilitate easier communication to help clarify her goals moving forward. It would also be more comfortable for her as we give her time to determine if she can recover. Danisha and her mother both agree to a tracheostomy. We did not discuss a time-frame that she would have this, but discussed that we can have these conversations when she is able to participate more. She understands that she can set a time limit on her treatments. She wants to proceed with trach, cortrak, and PICC line placement today. Until then she wants to be sedated to be more comfortable. Code status was not formally discussed during this meeting.  Code status: Full Code  Disposition: Continue current acute care   Time spent for the meeting: 30 min.  Julian Hy 03/22/2022, 9:17 AM

## 2022-03-22 NOTE — Progress Notes (Signed)
eLink Physician-Brief Progress Note Patient Name: Rachael [name] DOB: November 07, 2000 MRN: [id number]   Date of Service  03/22/2022  HPI/Events of Note  Request for foley cath and urine culture which was suggested by day team   eICU Interventions  Order placed     Intervention Category Intermediate Interventions: Other:  Rachael [name] 03/22/2022, 8:22 PM

## 2022-03-22 NOTE — Consult Note (Signed)
   OB/GYN Telephone Consult  03/22/2022   Rachael [name] is a 22 y.o. G1P1001 who is currently not pregnant, currently admitted to the ICU after acute stroke resulting in quadriplegia.   I was called for a consult regarding the care of this patient by the Neurologist caring for the patient, Dr. Erlinda Hong.   The provider had the following clinical question: Nexplanon removal in setting of stroke  The provider presented the following relevant clinical information: Placed in immediate postpartum period two years ago (confirmed on chart review) prior to hospital discharge after delivery. There is concern for possible hypercoagulable state as reason for removing the device. They report she is also a heavy smoker and had tox screens positive for opiates, benzodiazepines, and THC. Currently she has just undergone tracheostomy placement and is not able to discuss this. Per Neurology and ICU providers patient verbally consented to removal yesterday, and patient's mother and boyfriend are requesting removal.   I performed a chart review on the patient and reviewed available documentation.  BP (!) 142/83   Pulse (!) 57   Temp 98.1 F (36.7 C) (Oral)   Resp (!) 24   Ht 5' 7"$  (1.702 m)   Wt 101.2 kg Comment: Bed changed 2/18, previous weight innacurate.  SpO2 99%   BMI 34.94 kg/m   Exam- performed by consulting provider   Recommendations:  -There is no clear association with thromboembolic disease and progesterone only contraception such as Nexplanon, as such I would not recommend removing the device until patient is capable of having a discussion of risks and benefits as well as being able to give informed consent. -Quadriplegia does not preclude patient being sexually active and because of her presumably provoked thromboembolic stroke she is currently limited to progesterone only methods of contraception (including Nexplanon). Therefore I would recommend having an in depth conversation with the  patient regarding her options prior to removing effective and safe contraception. -Please call Gyn attending once patient is able to have this discussion, we will be happy to come and see her -If patient prefers to defer this conversation we are also happy to see her as an outpatient and can help to coordinate this.  Thank you for this consult and if additional recommendations are needed please call [phone] for the OB/GYN attending on service at [facility].   I spent approximately 5 minutes directly consulting with the provider and verbally discussing this case. Additionally 10 minutes minutes was spent performing chart review and documentation.    Clarnce Flock, MD/MPH Attending Family Medicine Physician, [facility] for [facility], [place]  3:51 PM 03/22/22

## 2022-03-22 NOTE — Progress Notes (Signed)
Pt stated multiple times, "Remove tube". The patients mom was present at bedside and aware of conversation and patients request. The patients Mother has requested to speak and have a goals of care meeting with the oncoming doctor for 2.19.23 to discuss patients wishes.   -Tawana Scale, RN

## 2022-03-22 NOTE — Procedures (Signed)
Diagnostic Bronchoscopy  Cabrini Winberry  [id number]  2000/03/27  Date:03/22/22  Time:11:54 AM   Provider Performing:Brooke Moshe Cipro   Procedure: Diagnostic Bronchoscopy [id number]) and BAL  Indication(s) Assist with direct visualization of tracheostomy placement  Consent Risks of the procedure as well as the alternatives and risks of each were explained to the patient and/or caregiver.  Consent for the procedure was obtained.   Anesthesia See separate tracheostomy note   Time Out Verified patient identification, verified procedure, site/side was marked, verified correct patient position, special equipment/implants available, medications/allergies/relevant history reviewed, required imaging and test results available.   Sterile Technique Usual hand hygiene, masks, gowns, and gloves were used   Procedure Description Bronchoscope advanced through endotracheal tube and into airway.  After suctioning out tracheal secretions, bronchoscope used to provide direct visualization of tracheostomy placement.   Complications/Tolerance None; patient tolerated the procedure well.   EBL None  Specimen(s) BAL sent      Kennieth Rad, ACNP [facility] 03/22/2022, 11:55 AM  See Amion for pager If no response to pager, please call PCCM consult pager After 7:00 pm call Elink

## 2022-03-22 NOTE — Progress Notes (Signed)
PT Cancellation Note  Patient Details Name: Rachael [name] MRN: [id number] DOB: [birth year]   Cancelled Treatment:    Reason Eval/Treat Not Completed: Medical issues which prohibited therapy (FIO2 70% on vent)   [name] 03/22/2022, 6:55 AM Bayard Males, PT Acute Rehabilitation Services Office: [phone]

## 2022-03-22 NOTE — Progress Notes (Signed)
0515: Elink notified of low phosphorus level. No new orders at this time.

## 2022-03-22 NOTE — Progress Notes (Addendum)
NAME:  Rachael [name], MRN:  [id number], DOB:  2000-06-18, LOS: 3 ADMISSION DATE:  03/05/2022, CONSULTATION DATE:  03/22/22 REFERRING MD:  drawbridge, CHIEF COMPLAINT:  shortness of breath and weakness   History of Present Illness:  Rachael [name] is a 22 y.o. F with history of depression and suicidal ideation who presented to [facility] with sudden onset of shortness of breath and upper and lower extremity weakness.  History taken from Epic as patient is intubated and no family is at the bedside.  She initially had a headache and back pain prior to onset but denied abdominal pain or chest pain.  Vitals showed an initial temp of 51F, bloodwork with WBC 18k, UDS + for bezos and THC.   Her symptoms progressed to the point she wasn't speaking and ABG showed hypercarbic respiratory failure so pt was intubated.  She had some bradycardia and hypotension post-intubation, so was started on peripheral Levophed and given atropine during transport   Pertinent  Medical History   has a past medical history of Depression, Hyperlipidemia, and Kidney abscess.   Significant Hospital Events: Including procedures, antibiotic start and stop dates in addition to other pertinent events   2/16 presented to [facility] with all weakness and shortness of breath requiring intubation and transfer to [facility] 2/18 episodes of desaturations and hypercarbia unexplained   2/17 Echo: LVEF 55-60%, normal valves, no evidence of intracardiac clot or intracardiac shunt 2/18 LE DVT> neg  Interim History / Subjective:  Overnight RN reports patient mouthing overnight> that patient was mouthing and adamant to remove ETT despite the probable risk of death> see note, mother at bedside and supports decision.  Also request per mother> no information to be given to anyone outside of mother, including code status, to anyone, no information to boyfriend.  Continued intermittent desaturations overnight, 70> 90%, 8 peep, minimal secretions Remains  on fentanyl for ETT discomfort   Objective   Blood pressure 124/73, pulse (!) 49, temperature 98.7 F (37.1 C), temperature source Axillary, resp. rate (!) 24, height 5' 7"$  (1.702 m), weight 101.2 kg, SpO2 91 %.    Vent Mode: PRVC FiO2 (%):  [70 %-90 %] 90 % Set Rate:  [24 bmp-28 bmp] 24 bmp Vt Set:  [490 mL] 490 mL PEEP:  [8 cmH20] 8 cmH20 Plateau Pressure:  [24 cmH20-28 cmH20] 27 cmH20   Intake/Output Summary (Last 24 hours) at 03/22/2022 0750 Last data filed at 03/22/2022 0500 Gross per 24 hour  Intake 3641.52 ml  Output 435 ml  Net 3206.52 ml   Filed Weights   03/20/22 0000 03/21/22 0400 03/22/22 0500  Weight: 73.9 kg 75 kg 101.2 kg    Fentanyl 150 General:  critically ill young adult female sitting upright in bed in NAD HEENT: MM pink/moist, ETT/ OGT, pupils 3/reactive Neuro: Alert, eyes open, mouths and blinks with eyes to communicate, seems appropriate, able to swallow and minimal gag, flaccid in extremities, still has sensation to UE and LE extremities CV: rr, SB PULM:  MV supported breaths, coarse with minimal tan secretions, no wheeze, no cough, flipped to PSV, no effort GI: obese, +bs, ND, purwick, liquid stool  Extremities: warm/dry, no LE edema  Skin: no rashes   2/16 Blood cultures> NGTD 2/16 Trach asp > rare GPR  UOP 456m/ 24hrs  Net +7.7  Labs reviwed> PCT remains neg, phos 1.4, K 3.6, sCr 0.62, WBC stable 20, H/H 12/ 37> 11.2> 34.4  CXR 2/19> stable lines, better aeration LLL, new RLL atelectasis, ?small effusion  Resolved Hospital Problem list     Assessment & Plan:   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 Acute hypercapnic and hypoxic respiratory failure due to NM weakness - etiology remains unclear, risk factors include heavy smoking, birth control, HLD,  - Appreciate Neurology input - OB consulted to remove Implanon contraceptive device per Neurology - hypercoagulable, Lyme, and autoimmune workup pending - serial  neuro exams - foley removed 2/18, bladder scan prn, at risk of neurogenic bladder - maintain neuro protective measures; goal for eurothermia, euglycemia, eunatermia, normoxia, and PCO2 goal of 35-40 - Nutrition and bowel regiment, PT/ OT - ASA, statin (eventual/ longterm) reassessment given teratogenic effect) - supportive care   Acute hypoxic & hypercarbic respiratory failure requiring MV due to NM weakness from C spine stroke. Acute hypercapnia with acute respiratory acidosis - discussed at length with mother privately then patient about patient's wishes at removing ETT.  Patient seems appropriate with mouthing words/ blinking but still not able to determine competency while on vent, and understanding as if we were to proceed with a one-way extubation, given her absent cough, minimal gag, absent trigger on PSV trial, she would likely immediately develop respiratory failure f/b cardiac arrest and pass away.  Patient and mother agrees to tracheostomy after discussions.  To be determined if this will be a time limited trial based on any neurological improvements, ongoing GOC's.  Unfortunately time is needed to determine if there will be any improvements.  Will consult PMT to help in ongoing [place].   - bedside perc trach today, ~1100 with Dr. Tamala Julian  - cortrak ordered  - cont LTVV, start daily PSV trials tomorrow - cont to wean FiO2/ PEEP as able - CXR given desaturations> no obvious cause - prn CXR/ ABG - LE dopplers neg, TTE wnl, no concern for PE - VAP/ PPI - PAD protocol> fentanyl gtt for now, minimize post trach, versed today prn  - aggressive pulm hygiene/ CPT/ HTS/ prn BD - follow trach asp, monitor off abx for now, PCT remains neg  Hypothermia, bradycardia Shock likely neurogenic - goal  MAP <65, remains bradycardic but normotensive - keep MIVF while NPO today, stop once TF resumed  - external warming PRN - limited PIV excess, PICC ordered  Polysubstance abuse- tobacco, MJ. Has  prescribed opiates and benzos. ETOH abuse  At risk for withdrawals  - adding klonopin per tube to help with anxiety - start Zoloft for prevention of depression - cessation counseling as appropriate  - MVI/ folate/ thiamine  - monitor closely for withdrawals  Hypophos - kphos replete.  Recheck this afternoon - replete electrolytes prn   Loose stools - place FMS given purwick - reduce miralax to daily and colace to BID PRN   Best Practice (right click and "Reselect all SmartList Selections" daily)   Diet/type: tubefeeds DVT prophylaxis: prophylactic heparin  GI prophylaxis: PPI Lines: N/A Foley:  N/A, purwick  Code Status:  full code Last date of multidisciplinary goals of care discussion [ mother updated at bedside]  Mother and patient updated on plan of care 2/19 Mother [name]- [place]   CBC: Recent Labs  Lab 03/14/2022 1825 03/05/2022 1919 03/20/22 1010 03/21/22 0127 03/21/22 0359 03/21/22 1140 03/21/22 2255 03/22/22 0154 03/22/22 0611  WBC 18.3*  --  13.5*  --   --   --   --  20.0* 20.3*  NEUTROABS 15.0*  --   --   --   --   --   --   --   --  HGB 13.8   < > 11.1*   < > 11.6* 10.5* 11.6* 12.0 11.2*  HCT 41.5   < > 34.9*   < > 34.0* 31.0* 34.0* 37.0 34.4*  MCV 84.3  --  87.5  --   --   --   --  88.7 87.3  PLT 299  --  221  --   --   --   --  177 193   < > = values in this interval not displayed.    Basic Metabolic Panel: Recent Labs  Lab 03/27/2022 1825 03/06/2022 1919 03/20/22 1010 03/21/22 0127 03/21/22 0339 03/21/22 0359 03/21/22 1140 03/21/22 2255 03/22/22 0154 03/22/22 0611  NA 136   < > 138   < > 138 139 138 140  --  139  K 3.9   < > 3.8   < > 4.4 4.2 3.5 3.8  --  3.6  CL 106  --  110  --  109  --   --   --   --  106  CO2 22  --  20*  --  20*  --   --   --   --  25  GLUCOSE 113*  --  102*  --  103*  --   --   --   --  146*  BUN 7  --  <5*  --  7  --   --   --   --  9  CREATININE 0.79  --  0.76  --  0.80  --   --   --   --  0.62   CALCIUM 9.7  --  8.0*  --  8.4*  --   --   --   --  8.6*  MG  --   --  1.8  --  2.3  --   --   --  2.2  --   PHOS  --   --  3.7  --  3.4  --   --   --  1.4*  --    < > = values in this interval not displayed.   GFR: Estimated Creatinine Clearance: 135.9 mL/min (by C-G formula based on SCr of 0.62 mg/dL). Recent Labs  Lab 03/14/2022 1825 03/20/2022 1829 03/20/22 0030 03/20/22 1010 03/21/22 0339 03/22/22 0154 03/22/22 0611  PROCALCITON  --   --   --  <0.10 <0.10 <0.10  --   WBC 18.3*  --   --  13.5*  --  20.0* 20.3*  LATICACIDVEN  --  0.7 1.8  --   --   --   --     Liver Function Tests: Recent Labs  Lab 03/28/2022 1825  AST 14*  ALT 14  ALKPHOS 74  BILITOT 0.6  PROT 7.6  ALBUMIN 4.6    ABG    Component Value Date/Time   PHART 7.359 03/21/2022 2255   PCO2ART 40.4 03/21/2022 2255   PO2ART 59 (L) 03/21/2022 2255   HCO3 22.7 03/21/2022 2255   TCO2 24 03/21/2022 2255   ACIDBASEDEF 3.0 (H) 03/21/2022 2255   O2SAT 89 03/21/2022 2255     Coagulation Profile: Recent Labs  Lab 03/18/2022 1825  INR 1.0   Critical care time: 65 min      Amanda Cockayne [facility] 03/22/2022, 7:50 AM  See Amion for pager If no response to pager, please call PCCM consult pager After 7:00 pm call Elink

## 2022-03-22 NOTE — Progress Notes (Signed)
STROKE TEAM PROGRESS NOTE   SUBJECTIVE (INTERVAL HISTORY) Her mom is at the bedside.  Pt lying in bed, drowsy but easily arousable, AAO x 3. Had trach this morning. Still has quadriplegia.    OBJECTIVE Temp:  [98 F (36.7 C)-98.7 F (37.1 C)] 98 F (36.7 C) (02/19 1544) Pulse Rate:  [48-77] 77 (02/19 1558) Cardiac Rhythm: Sinus bradycardia (02/19 0800) Resp:  [20-24] 24 (02/19 1558) BP: (111-142)/(60-92) 128/72 (02/19 1558) SpO2:  [79 %-100 %] 94 % (02/19 1558) FiO2 (%):  [50 %-100 %] 50 % (02/19 1558) Weight:  [101.2 kg] 101.2 kg (02/19 0500)  Recent Labs  Lab 03/22/22 0328 03/22/22 0415 03/22/22 0755 03/22/22 1316 03/22/22 1541  GLUCAP 125* 131* 152* 121* 129*   Recent Labs  Lab 03/07/2022 1825 03/13/2022 1919 03/20/22 1010 03/21/22 0127 03/21/22 0339 03/21/22 0359 03/21/22 1140 03/21/22 2255 03/22/22 0154 03/22/22 0611  NA 136   < > 138   < > 138 139 138 140  --  139  K 3.9   < > 3.8   < > 4.4 4.2 3.5 3.8  --  3.6  CL 106  --  110  --  109  --   --   --   --  106  CO2 22  --  20*  --  20*  --   --   --   --  25  GLUCOSE 113*  --  102*  --  103*  --   --   --   --  146*  BUN 7  --  <5*  --  7  --   --   --   --  9  CREATININE 0.79  --  0.76  --  0.80  --   --   --   --  0.62  CALCIUM 9.7  --  8.0*  --  8.4*  --   --   --   --  8.6*  MG  --   --  1.8  --  2.3  --   --   --  2.2  --   PHOS  --   --  3.7  --  3.4  --   --   --  1.4*  --    < > = values in this interval not displayed.   Recent Labs  Lab 03/09/2022 1825  AST 14*  ALT 14  ALKPHOS 74  BILITOT 0.6  PROT 7.6  ALBUMIN 4.6   Recent Labs  Lab 03/06/2022 1825 03/14/2022 1919 03/20/22 1010 03/21/22 0127 03/21/22 0359 03/21/22 1140 03/21/22 2255 03/22/22 0154 03/22/22 0611  WBC 18.3*  --  13.5*  --   --   --   --  20.0* 20.3*  NEUTROABS 15.0*  --   --   --   --   --   --   --   --   HGB 13.8   < > 11.1*   < > 11.6* 10.5* 11.6* 12.0 11.2*  HCT 41.5   < > 34.9*   < > 34.0* 31.0* 34.0* 37.0 34.4*   MCV 84.3  --  87.5  --   --   --   --  88.7 87.3  PLT 299  --  221  --   --   --   --  177 193   < > = values in this interval not displayed.   No results for input(s): "CKTOTAL", "CKMB", "CKMBINDEX", "TROPONINI" in the last 168 hours.  Recent Labs    03/10/2022 1825  LABPROT 12.6  INR 1.0   Recent Labs    03/24/2022 1825  COLORURINE YELLOW  LABSPEC 1.033*  PHURINE 6.5  GLUCOSEU NEGATIVE  HGBUR NEGATIVE  BILIRUBINUR NEGATIVE  KETONESUR 40*  PROTEINUR 30*  NITRITE NEGATIVE  LEUKOCYTESUR NEGATIVE       Component Value Date/Time   CHOL 176 03/20/2022 1010   TRIG 138 03/20/2022 1010   TRIG 140 03/20/2022 1010   HDL 37 (L) 03/20/2022 1010   CHOLHDL 4.8 03/20/2022 1010   VLDL 28 03/20/2022 1010   LDLCALC 111 (H) 03/20/2022 1010   Lab Results  Component Value Date   HGBA1C 5.3 03/20/2022      Component Value Date/Time   LABOPIA POSITIVE (A) 03/20/2022 1825   COCAINSCRNUR NONE DETECTED 03/09/2022 1825   LABBENZ POSITIVE (A) 03/10/2022 1825   AMPHETMU NONE DETECTED 03/07/2022 1825   THCU POSITIVE (A) 03/14/2022 1825   LABBARB NONE DETECTED 03/26/2022 1825    Recent Labs  Lab 03/08/2022 1825  ETH <10    I have personally reviewed the radiological images below and agree with the radiology interpretations.  DG Abd Portable 1V  Result Date: 03/22/2022 CLINICAL DATA:  Encounter for feeding tube placement. EXAM: PORTABLE ABDOMEN - 1 VIEW COMPARISON:  None Available. FINDINGS: The tip of the weighted enteric tube is just to the right of midline in the region of the distal stomach or proximal duodenum. There is mild gaseous distention of bowel in the central abdomen. IMPRESSION: Tip of the weighted enteric tube in the distal stomach or proximal duodenum. Electronically Signed   By: Keith Rake M.D.   On: 03/22/2022 13:35   DG Chest Port 1 View  Result Date: 03/22/2022 CLINICAL DATA:  Respiratory failure and status post tracheostomy. EXAM: PORTABLE CHEST 1 VIEW COMPARISON:   Film earlier today at 0758 hours FINDINGS: Stable heart size. Interval tracheostomy and removal of endotracheal tube. The tracheostomy tube appears in appropriate position. Standard gastric decompression tube has been removed and a Dobbhoff feeding tube placed. Relatively stable right lower lobe atelectasis. No pneumothorax. IMPRESSION: Appropriate position of tracheostomy tube. Stable right lower lobe atelectasis. Removal of standard gastric decompression tube and placement of Dobbhoff feeding tube. Electronically Signed   By: Aletta Edouard M.D.   On: 03/22/2022 12:50   [place] EKG SITE RITE  Result Date: 03/22/2022 If Site Rite image not attached, placement could not be confirmed due to current cardiac rhythm.  DG Chest Port 1 View  Result Date: 03/22/2022 CLINICAL DATA:  Respiratory failure EXAM: PORTABLE CHEST 1 VIEW COMPARISON:  Chest radiograph 1 day prior FINDINGS: Endotracheal tube tip is proximally 3.1 cm from the carina. The enteric catheter tip is off field of view The cardiomediastinal silhouette is normal There is a small right pleural effusion which may be slightly increased since prior study. Hazy opacities in the right lower lobe are favored to reflect atelectasis. The previously seen small left pleural effusion appears resolved with improved aeration of the left lower lobe. Aeration is otherwise unchanged. There is no pneumothorax The bones are stable. IMPRESSION: 1. New small right pleural effusion with probable adjacent atelectasis. 2. Resolved small left pleural effusion with improved aeration of the left lower lobe. Electronically Signed   By: Valetta Mole M.D.   On: 03/22/2022 08:18   VAS [place] LOWER EXTREMITY VENOUS (DVT)  Result Date: 03/21/2022  Lower Venous DVT Study Patient Name:  Rachael [name]  Date of Exam:   03/21/2022  Medical Rec #: [id number]         Accession #:    [id number] Date of Birth: Aug 21, 2000          Patient Gender: F Patient Age:   22 years Exam Location:  [facility] Procedure:      VAS [place] LOWER EXTREMITY VENOUS (DVT) Referring Phys: Cornelius Moras Renee Beale --------------------------------------------------------------------------------  Other Indications: Spinal stroke, paralysis. Limitations: Ventilation, paralysis. Comparison Study: No prior study on file Performing Technologist: Sharion Dove RVS  Examination Guidelines: A complete evaluation includes B-mode imaging, spectral Doppler, color Doppler, and power Doppler as needed of all accessible portions of each vessel. Bilateral testing is considered an integral part of a complete examination. Limited examinations for reoccurring indications may be performed as noted. The reflux portion of the exam is performed with the patient in reverse Trendelenburg.  +---------+---------------+---------+-----------+----------+-------------------+ RIGHT    CompressibilityPhasicitySpontaneityPropertiesThrombus Aging      +---------+---------------+---------+-----------+----------+-------------------+ CFV      Full           Yes      Yes                                      +---------+---------------+---------+-----------+----------+-------------------+ SFJ      Full                                                             +---------+---------------+---------+-----------+----------+-------------------+ FV Prox  Full                                                             +---------+---------------+---------+-----------+----------+-------------------+ FV Mid   Full                                                             +---------+---------------+---------+-----------+----------+-------------------+ FV DistalFull                                                             +---------+---------------+---------+-----------+----------+-------------------+ PFV      Full                                                              +---------+---------------+---------+-----------+----------+-------------------+ POP                     Yes      Yes                  patent by color  and                                                       Doppler             +---------+---------------+---------+-----------+----------+-------------------+ PTV      Full                                                             +---------+---------------+---------+-----------+----------+-------------------+ PERO     Full                                                             +---------+---------------+---------+-----------+----------+-------------------+   +---------+---------------+---------+-----------+----------+--------------+ LEFT     CompressibilityPhasicitySpontaneityPropertiesThrombus Aging +---------+---------------+---------+-----------+----------+--------------+ CFV      Full           Yes      Yes                                 +---------+---------------+---------+-----------+----------+--------------+ SFJ      Full                                                        +---------+---------------+---------+-----------+----------+--------------+ FV Prox  Full                                                        +---------+---------------+---------+-----------+----------+--------------+ FV Mid   Full                                                        +---------+---------------+---------+-----------+----------+--------------+ FV DistalFull                                                        +---------+---------------+---------+-----------+----------+--------------+ PFV      Full                                                        +---------+---------------+---------+-----------+----------+--------------+ POP      Full           Yes  Yes                                 +---------+---------------+---------+-----------+----------+--------------+ PTV       Full                                                        +---------+---------------+---------+-----------+----------+--------------+ PERO     Full                                                        +---------+---------------+---------+-----------+----------+--------------+     Summary: BILATERAL: - No evidence of deep vein thrombosis seen in the lower extremities, bilaterally. -No evidence of popliteal cyst, bilaterally.   *See table(s) above for measurements and observations. Electronically signed by Deitra Mayo MD on 03/21/2022 at 8:20:57 PM.    Final    DG Chest Port 1 View  Result Date: 03/21/2022 CLINICAL DATA:  Intubation.  Respiratory distress. EXAM: PORTABLE CHEST 1 VIEW COMPARISON:  Chest radiograph dated 03/20/2022. FINDINGS: Endotracheal tube with tip approximately 4 cm above the carina. Enteric tube extends below the diaphragm with tip beyond the inferior margin of the image. Improved aeration of the left lower lobe since the earlier radiograph. Minimal right base atelectasis. No pleural effusion or pneumothorax. The cardiac silhouette is within limits. No acute osseous pathology. IMPRESSION: 1. Endotracheal tube above the carina. 2. Improved aeration of the left lower lobe. Electronically Signed   By: Anner Crete M.D.   On: 03/21/2022 01:10   ECHOCARDIOGRAM COMPLETE  Result Date: 03/20/2022    ECHOCARDIOGRAM REPORT   Patient Name:   JUANA DILAURA Date of Exam: 03/20/2022 Medical Rec #:  [id number]        Height:       67.0 in Accession #:    [id number]       Weight:       162.9 lb Date of Birth:  Dec 31, 2000         BSA:          1.853 m Patient Age:    22 years         BP:           100/52 mmHg Patient Gender: F                HR:           63 bpm. Exam Location:  Inpatient Procedure: 2D Echo, Cardiac Doppler, Color Doppler and Strain Analysis Indications:    Stroke I63.9  History:        Patient has no prior history of Echocardiogram examinations.  Sonographer:     Slayden Referring Phys: [id number] [facility]  Sonographer Comments: Echo performed with patient supine and on artificial respirator. IMPRESSIONS  1. Left ventricular ejection fraction, by estimation, is 55 to 60%. The left ventricle has normal function. The left ventricle has no regional wall motion abnormalities. Left ventricular diastolic parameters were normal. The average left ventricular global longitudinal strain is -25.9 %. The global longitudinal strain is normal.  2. Right ventricular systolic function is normal. The right ventricular size  is normal. There is normal pulmonary artery systolic pressure.  3. The mitral valve is normal in structure. No evidence of mitral valve regurgitation. No evidence of mitral stenosis.  4. The aortic valve is tricuspid. Aortic valve regurgitation is not visualized. No aortic stenosis is present.  5. The inferior vena cava is normal in size with greater than 50% respiratory variability, suggesting right atrial pressure of 3 mmHg. FINDINGS  Left Ventricle: Left ventricular ejection fraction, by estimation, is 55 to 60%. The left ventricle has normal function. The left ventricle has no regional wall motion abnormalities. The average left ventricular global longitudinal strain is -25.9 %. The global longitudinal strain is normal. The left ventricular internal cavity size was normal in size. There is no left ventricular hypertrophy. Left ventricular diastolic parameters were normal. Right Ventricle: The right ventricular size is normal. No increase in right ventricular wall thickness. Right ventricular systolic function is normal. There is normal pulmonary artery systolic pressure. The tricuspid regurgitant velocity is 2.35 m/s, and  with an assumed right atrial pressure of 8 mmHg, the estimated right ventricular systolic pressure is 0000000 mmHg. Left Atrium: Left atrial size was normal in size. Right Atrium: Right atrial size was normal in size. Pericardium: There  is no evidence of pericardial effusion. Mitral Valve: The mitral valve is normal in structure. No evidence of mitral valve regurgitation. No evidence of mitral valve stenosis. Tricuspid Valve: The tricuspid valve is normal in structure. Tricuspid valve regurgitation is mild . No evidence of tricuspid stenosis. Aortic Valve: The aortic valve is tricuspid. Aortic valve regurgitation is not visualized. No aortic stenosis is present. Pulmonic Valve: The pulmonic valve was normal in structure. Pulmonic valve regurgitation is not visualized. No evidence of pulmonic stenosis. Aorta: The aortic root is normal in size and structure. Venous: The inferior vena cava is normal in size with greater than 50% respiratory variability, suggesting right atrial pressure of 3 mmHg. IAS/Shunts: No atrial level shunt detected by color flow Doppler.  LEFT VENTRICLE PLAX 2D LVIDd:         4.70 cm   Diastology LVIDs:         2.60 cm   LV e' medial:    9.25 cm/s LV PW:         1.10 cm   LV E/e' medial:  8.8 LV IVS:        1.00 cm   LV e' lateral:   19.60 cm/s LVOT diam:     2.30 cm   LV E/e' lateral: 4.1 LV SV:         85 LV SV Index:   46        2D Longitudinal Strain LVOT Area:     4.15 cm  2D Strain GLS Avg:     -25.9 %  RIGHT VENTRICLE             IVC RV S prime:     12.60 cm/s  IVC diam: 2.00 cm TAPSE (M-mode): 2.2 cm LEFT ATRIUM             Index        RIGHT ATRIUM           Index LA diam:        3.40 cm 1.83 cm/m   RA Area:     14.90 cm LA Vol (A2C):   47.5 ml 25.63 ml/m  RA Volume:   38.50 ml  20.77 ml/m LA Vol (A4C):   29.8 ml 16.08 ml/m LA  Biplane Vol: 39.9 ml 21.53 ml/m  AORTIC VALVE LVOT Vmax:   97.45 cm/s LVOT Vmean:  62.650 cm/s LVOT VTI:    0.206 m  AORTA Ao Root diam: 2.70 cm Ao Asc diam:  2.30 cm Ao Desc diam: 2.00 cm MITRAL VALVE               TRICUSPID VALVE MV Area (PHT): 2.91 cm    TR Peak grad:   22.1 mmHg MV Decel Time: 261 msec    TR Vmax:        235.00 cm/s MV E velocity: 81.20 cm/s MV A velocity: 52.70 cm/s   SHUNTS MV E/A ratio:  1.54        Systemic VTI:  0.21 m                            Systemic Diam: 2.30 cm Jenkins Rouge MD Electronically signed by Jenkins Rouge MD Signature Date/Time: 03/20/2022/11:01:55 AM    Final    DG CHEST PORT 1 VIEW  Result Date: 03/20/2022 CLINICAL DATA:  Shortness of breath EXAM: PORTABLE CHEST 1 VIEW COMPARISON:  03/18/2022 FINDINGS: Endotracheal tube with tip between the clavicular heads and carina. An enteric tube reaches the stomach. Artifact from EKG leads. Linear opacity in the right mid lung. Dense opacity with volume loss at the left base. No pneumothorax IMPRESSION: 1. Worsening left lower lobe aeration with volume loss. Mild atelectasis on the right. 2. Unremarkable hardware positioning. Electronically Signed   By: Jorje Guild M.D.   On: 03/20/2022 06:42   MR CERVICAL SPINE W WO CONTRAST  Result Date: 03/20/2022 CLINICAL DATA:  Myelopathy EXAM: MRI CERVICAL SPINE WITHOUT AND WITH CONTRAST TECHNIQUE: Multiplanar and multiecho pulse sequences of the cervical spine, to include the craniocervical junction and cervicothoracic junction, were obtained without and with intravenous contrast. CONTRAST:  7.5 mL Gadavist COMPARISON:  No prior MRI of the cervical spine, correlation is made with same day MRI head and CT cervical spine FINDINGS: Alignment: No listhesis. Vertebrae: No fracture, evidence of discitis, or bone lesion. Cord: Restricted diffusion with ADC correlate along the bilateral anterior aspect of the spinal cord from the craniocervical junction (series 6, image 93) to the level of C7 (series 6, image 56). This correlates with increased T2 hyperintense signal, most likely in the anterior horn cells. Spinal cord is otherwise normal in caliber and signal. No abnormal enhancement. Posterior Fossa, vertebral arteries, paraspinal tissues: Negative. Disc levels: Preserved disc height and disc hydration. No spinal canal stenosis or neural foraminal narrowing. 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 These findings were discussed by telephone on 03/20/2022 at 3:30 is am with provider [facility] . Electronically Signed   By: Merilyn Baba M.D.   On: 03/20/2022 03:54   MR BRAIN WO CONTRAST  Result Date: 03/20/2022 CLINICAL DATA:  Altered mental status, stroke suspected EXAM: MRI HEAD WITHOUT CONTRAST TECHNIQUE: Multiplanar, multiecho pulse sequences of the brain and surrounding structures were obtained without intravenous contrast. COMPARISON:  None Available. FINDINGS: Brain: Restricted diffusion with ADC correlate in the anteromedial cervical spinal cord (series 5, image 54) at the craniocervical junction. No other restricted diffusion to suggest acute or subacute infarct. No acute hemorrhage, mass, mass effect, or midline shift. No hydrocephalus or extra-axial collection. Normal pituitary  and craniocervical junction. No hemosiderin deposition to suggest remote hemorrhage. Normal cerebral volume for age. Vascular: Normal arterial flow voids. Skull and upper cervical spine: Normal marrow signal. Sinuses/Orbits: Clear paranasal sinuses. No acute finding in the orbits. Other: Trace fluid in the right mastoid air cells. IMPRESSION: 1. 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 Attention on subsequent MRI cervical spine. 2. No other acute intracranial process. These findings were discussed by telephone on 03/20/2022 at 3:05 am with provider [facility] . Electronically Signed   By: Merilyn Baba M.D.   On: 03/20/2022 03:14   CT C-SPINE NO CHARGE  Result Date: 03/20/2022 CLINICAL DATA:  Stroke code, concern for cervical pathology EXAM: CT CERVICAL SPINE WITHOUT CONTRAST  TECHNIQUE: Multidetector CT imaging of the cervical spine was performed without intravenous contrast. Multiplanar CT image reconstructions were also generated. RADIATION DOSE REDUCTION: This exam was performed according to the departmental dose-optimization program which includes automated exposure control, adjustment of the mA and/or kV according to patient size and/or use of iterative reconstruction technique. COMPARISON:  None Available. FINDINGS: Alignment: No listhesis. Straightening of the normal cervical lordosis, which may be positional. Skull base and vertebrae: No acute fracture. No primary bone lesion or focal pathologic process. Soft tissues and spinal canal: No prevertebral fluid or swelling. No visible canal hematoma. Disc levels: Disc heights are preserved. No significant degenerative changes. No significant spinal canal stenosis or neural foraminal narrowing. Upper chest: No focal pulmonary opacity or pleural effusion. Other: Endotracheal and orogastric tubes noted. IMPRESSION: No acute or chronic abnormality in the cervical spine. Electronically Signed   By: Merilyn Baba M.D.   On: 03/20/2022 03:00   CT ANGIO HEAD NECK W WO CM W PERF (CODE STROKE)  Result Date: 03/20/2022 CLINICAL DATA:  Stroke suspected, extremity numbness and weakness EXAM: CT ANGIOGRAPHY HEAD AND NECK CT PERFUSION TECHNIQUE: Multidetector CT imaging of the head and neck was performed using the standard protocol during bolus administration of intravenous contrast. Multiplanar CT image reconstructions and MIPs were obtained to evaluate the vascular anatomy. Carotid stenosis measurements (when applicable) are obtained utilizing NASCET criteria, using the distal internal carotid diameter as the denominator. Multiphase CT imaging of the brain was performed following IV bolus contrast injection. Subsequent parametric perfusion maps were calculated using RAPID software. RADIATION DOSE REDUCTION: This exam was performed according to  the departmental dose-optimization program which includes automated exposure control, adjustment of the mA and/or kV according to patient size and/or use of iterative reconstruction technique. CONTRAST:  80 mL Omnipaque 300 COMPARISON:  No prior CTA, correlation is made with CT head 03/20/2022 FINDINGS: CT HEAD FINDINGS For noncontrast findings, please see same day CT head. CTA NECK FINDINGS Aortic arch: Two-vessel arch with a common origin of the brachiocephalic and left common carotid arteries. Imaged portion shows no evidence of aneurysm or dissection. No significant stenosis of the major arch vessel origins. Right carotid system: No evidence of stenosis, dissection, or occlusion. Left carotid system: No evidence of stenosis, dissection, or occlusion. Vertebral arteries: No evidence of stenosis, dissection, or occlusion. Skeleton: No acute osseous abnormality.  No visible canal hematoma. Other neck: Endotracheal and orogastric tubes noted. Otherwise negative. Upper chest: Negative. Review of the MIP images confirms the above findings CTA HEAD FINDINGS Anterior circulation: Both internal carotid arteries are patent to the termini, without significant stenosis. A1 segments patent. Normal anterior communicating artery. Anterior cerebral arteries are patent to their  distal aspects. No M1 stenosis or occlusion. MCA branches perfused and symmetric. Posterior circulation: Vertebral arteries patent to the vertebrobasilar junction without stenosis. Posterior inferior cerebellar arteries patent proximally. Basilar patent to its distal aspect. Superior cerebellar arteries patent proximally. Patent P1 segments. PCAs perfused to their distal aspects without stenosis. The bilateral posterior communicating arteries are diminutive but patent. Venous sinuses: As permitted by contrast timing, patent. Anatomic variants: None significant. Review of the MIP images confirms the above findings CT Brain Perfusion Findings: ASPECTS: 10  CBF (<30%) Volume: 35m Perfusion (Tmax>6.0s) volume: 049mMismatch Volume: 59m38mnfarction Location:None IMPRESSION: 1. No intracranial large vessel occlusion or significant stenosis. 2. No hemodynamically significant stenosis in the neck. 3. No evidence of infarct core or penumbra on CT perfusion. Electronically Signed   By: AliMerilyn BabaD.   On: 03/20/2022 02:43   CT HEAD CODE STROKE WO CONTRAST  Result Date: 03/20/2022 CLINICAL DATA:  Code stroke. EXAM: CT HEAD WITHOUT CONTRAST TECHNIQUE: Contiguous axial images were obtained from the base of the skull through the vertex without intravenous contrast. RADIATION DOSE REDUCTION: This exam was performed according to the departmental dose-optimization program which includes automated exposure control, adjustment of the mA and/or kV according to patient size and/or use of iterative reconstruction technique. COMPARISON:  03/21/2022 FINDINGS: Brain: No evidence of acute infarction, hemorrhage, mass, mass effect, or midline shift. No hydrocephalus or extra-axial collection. Vascular: No hyperdense vessel. Skull: Negative for fracture or focal lesion. Sinuses/Orbits: No acute finding. Other: The mastoid air cells are well aerated. ASPECTS ([facility] Early CT Score) - Ganglionic level infarction (caudate, lentiform nuclei, internal capsule, insula, M1-M3 cortex): 7 - Supraganglionic infarction (M4-M6 cortex): 3 Total score (0-10 with 10 being normal): 10 IMPRESSION: No evidence of acute intracranial abnormality. ASPECTS is 10. Imaging results were communicated on 03/20/2022 at 2:19 am to provider Dr. KhaLorrin Goodella secure text paging. Electronically Signed   By: AliMerilyn BabaD.   On: 03/20/2022 02:19   DG CHEST PORT 1 VIEW  Result Date: 03/20/2022 CLINICAL DATA:  Check gastric catheter placement EXAM: PORTABLE CHEST 1 VIEW COMPARISON:  Film from earlier in the same day. FINDINGS: Endotracheal tube is noted in satisfactory position. Gastric catheter  extends into the stomach. Lungs are well aerated bilaterally. Mild left basilar atelectasis and small effusion is noted. No other focal abnormality is seen. IMPRESSION: Tubes and lines in satisfactory position. Mild left basilar atelectasis. Electronically Signed   By: MarInez CatalinaD.   On: 03/20/2022 00:12   DG Chest Portable 1 View  Result Date: 03/21/2022 CLINICAL DATA:  OG tube placement EXAM: PORTABLE CHEST 1 VIEW COMPARISON:  03/04/2022, 8:44 p.m. FINDINGS: Similar position of endotracheal tube, positioned over the midtrachea. Similar position of esophagogastric tube, malpositioned, tip over the trachea. Unchanged elevation of the left hemidiaphragm. Heart and mediastinum unremarkable. Osseous structures unremarkable. IMPRESSION: 1. Similar position of esophagogastric tube, malpositioned, tip over the trachea. Recommend repositioning to ensure positioned within the esophagus. 2. Similar position of endotracheal tube, positioned over the midtrachea. These results will be called to the ordering clinician or representative by the Radiologist Assistant, and communication documented in the PACS or [employer]ronically Signed   By: AleDelanna AhmadiD.   On: 03/15/2022 21:33   DG Chest Portable 1 View  Result Date: 03/21/2022 CLINICAL DATA:  Post intubation. EXAM: PORTABLE CHEST 1 VIEW COMPARISON:  03/24/2022. FINDINGS: Heart is enlarged and the mediastinal contour is within normal limits. No consolidation, effusion, or pneumothorax. A  soft tissue density is present over the left lung field, likely reflecting breast tissue. The endotracheal tube terminates 5.1 cm above the carina. An enteric tube terminates in the mid esophagus and should be advanced approximately 26 cm. IMPRESSION: 1. No active disease. 2. An enteric tube terminates in the proximal to mid esophagus and should be advanced 26 cm. 3. Endotracheal tube terminates 5.1 cm above the carina. Electronically Signed   By: Brett Fairy  M.D.   On: 03/09/2022 20:57   DG Chest Portable 1 View  Result Date: 03/29/2022 CLINICAL DATA:  Cough weakness EXAM: PORTABLE CHEST 1 VIEW COMPARISON:  None Available. FINDINGS: Hazy opacity at the left base. Right lung is clear. Normal cardiac size. No pneumothorax IMPRESSION: Hazy opacity at the left base may reflect atelectasis or mild pneumonia. Electronically Signed   By: Donavan Foil M.D.   On: 03/09/2022 18:56   CT HEAD WO CONTRAST  Result Date: 03/10/2022 CLINICAL DATA:  Weakness headache unable to move arms and legs EXAM: CT HEAD WITHOUT CONTRAST TECHNIQUE: Contiguous axial images were obtained from the base of the skull through the vertex without intravenous contrast. RADIATION DOSE REDUCTION: This exam was performed according to the departmental dose-optimization program which includes automated exposure control, adjustment of the mA and/or kV according to patient size and/or use of iterative reconstruction technique. COMPARISON:  None Available. FINDINGS: Brain: No evidence of acute infarction, hemorrhage, hydrocephalus, extra-axial collection or mass lesion/mass effect. Vascular: No hyperdense vessel or unexpected calcification. Skull: Normal. Negative for fracture or focal lesion. Sinuses/Orbits: No acute finding. Other: None IMPRESSION: Negative non contrasted CT appearance of the brain. Electronically Signed   By: Donavan Foil M.D.   On: 03/14/2022 18:55     PHYSICAL EXAM  Temp:  [98 F (36.7 C)-98.7 F (37.1 C)] 98 F (36.7 C) (02/19 1544) Pulse Rate:  [48-77] 77 (02/19 1558) Resp:  [20-24] 24 (02/19 1558) BP: (111-142)/(60-92) 128/72 (02/19 1558) SpO2:  [79 %-100 %] 94 % (02/19 1558) FiO2 (%):  [50 %-100 %] 50 % (02/19 1558) Weight:  [101.2 kg] 101.2 kg (02/19 0500)  General - Well nourished, well developed, in no apparent distress, s/p trach.  Ophthalmologic - fundi not visualized due to noncooperation.  Cardiovascular - Regular rhythm and rate.  Neuro - drowsy  sleepy but easily arousable with voice, s/p trach still on vent but able to mouth answers, fully orientated, no aphasia, following all simple commands. Able to name and repeat with mouthing words. No gaze palsy, tracking bilaterally, visual field full, PERRL.  Facial symmetrical.  Tongue protrusion midline.  No neck movement, not able to shrug shoulders.  Bilateral UEs and LEs 0/5, no movement, flaccid without tone. Sensation symmetrical bilaterally subjectively, preserved positional sensation, light touch, b/l DTR diminished, no Babinski, gait not tested.     ASSESSMENT/PLAN [name] is a 22 y.o. female with history of anxiety, depression, smoker with birth control depot, alcohol abuse, THC user admitted for acute arm and leg weakness, respiratory failure, nausea vomiting difficulty speaking but able to mouth words. No tPA given due to outside window.    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  MRI C-spine signal  change at anterior aspect of the spinal cord from the craniocervical junction to the level of C7, concerning for acute spinal cord ischemia in an anterior spinal artery pattern. 2D Echo  EF 55-60% LE venous doppler no DVT Will plan TEE for further stroke work up given stroke in such young age LDL 111 HgbA1c 5.3 Hypercoagulable work up pending Autoimmune work up ANA neg, rest pending ESR/CRP/RPR/HIV neg B12 = 193, relatively low Heparin subq for VTE prophylaxis No antithrombotic prior to admission, now on aspirin 81 mg daily. Not on DAPT for now given possible PEG  Ongoing aggressive stroke risk factor  management Therapy recommendations:  pending Disposition:  pending  Respiratory failure Due to high cervical infarct Was intubated, s/p trach 2/19 On vent CCM managing  BP management Stable  Avoid low BP Long term BP goal normotensive  Hyperlipidemia Home meds:  none  LDL 111, goal < 70 Now on crestor 20 (although pt child bearing age, but will not in pregnancy for some time. Will provide education of statin in childbearing age women) Continue statin at discharge  Nexplanon implant Daily smoker Pt had Nexplanon implanted 2 years ago Daily smoker, 1PPD Smoking cessation counseling provided Negative DVT Discussed with Dr. Hollice Gong and he felt Nexplanon has low risk for thromboembolic event, he will need further discussion with pt before removing   Alcohol abuse Daily drinker, at least half pint Will educate on alcohol limitation CIWA protocol FA/B1/MVI  B12 deficiency B12 = 193, relatively low end of normal B12 supplement  Other Stroke Risk Factors Daily THC user, will education on cessation   Other Active Problems Back pain due to scoliosis, on Percocet Xanax as needed for anxiety/depression  Hospital day # 3  This patient is 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 This patient's care requires constant monitoring of vital signs, hemodynamics, respiratory and cardiac monitoring, review of multiple databases, neurological assessment, discussion with family, other specialists and medical decision making of high complexity. I spent 35 minutes of neurocritical care time in the care of this patient. I had long discussion with mom at bedside, updated pt current condition, treatment plan and potential prognosis, and answered all the questions. She expressed understanding and appreciation. I also discussed with Dr Martie Lee, MD PhD Stroke Neurology 03/22/2022 4:21 PM    To contact Stroke Continuity provider, please refer to [email or web address] After hours, contact General Neurology

## 2022-03-22 NOTE — Progress Notes (Signed)
Pharmacy Antibiotic Note  Rachael [name] is a 22 y.o. female admitted on 03/05/2022 with pneumonia.  Pharmacy has been consulted for zosyn dosing.  Pt admitted for spinal cord infarction. Zosyn added to r/o PNA.   Scr<1  Plan: Zosyn 3.375g IV x1 then 3.375g IV q8  Height: 5' 7"$  (170.2 cm) Weight: 101.2 kg (223 lb 1.7 oz) (Bed changed 2/18, previous weight innacurate.) IBW/kg (Calculated) : 61.6  Temp (24hrs), Avg:98.2 F (36.8 C), Min:98 F (36.7 C), Max:98.7 F (37.1 C)  Recent Labs  Lab 03/25/2022 1825 03/27/2022 1829 03/20/22 0030 03/20/22 1010 03/21/22 0339 03/22/22 0154 03/22/22 0611  WBC 18.3*  --   --  13.5*  --  20.0* 20.3*  CREATININE 0.79  --   --  0.76 0.80  --  0.62  LATICACIDVEN  --  0.7 1.8  --   --   --   --     Estimated Creatinine Clearance: 135.9 mL/min (by C-G formula based on SCr of 0.62 mg/dL).    No Known Allergies  Antimicrobials this admission: 2/19 zosyn>>  Dose adjustments this admission:   Microbiology results: 2/19 BAL>>GNR/GPC on gram stain 2/18 TA>>ngtd 2/16 blood>>ngtd MRSA/COVID neg  Onnie Boer, PharmD, BCIDP, AAHIVP, CPP Infectious Disease Pharmacist 03/22/2022 4:35 PM

## 2022-03-22 NOTE — Progress Notes (Signed)
SLP Cancellation Note  Patient Details Name: Rachael [name] MRN: [id number] DOB: Jun 24, 2000   Cancelled treatment:       Reason Eval/Treat Not Completed: Other (comment) Patient with new tracheostomy. Orders for SLP eval and treat for PMSV and swallowing received. Will follow pt closely for readiness for SLP interventions as appropriate.      Kit Brubacher, Katherene Ponto 03/22/2022, 2:17 PM

## 2022-03-23 ENCOUNTER — Inpatient Hospital Stay (HOSPITAL_COMMUNITY): Payer: Medicaid Other

## 2022-03-23 DIAGNOSIS — Z515 Encounter for palliative care: Secondary | ICD-10-CM

## 2022-03-23 DIAGNOSIS — J9601 Acute respiratory failure with hypoxia: Secondary | ICD-10-CM | POA: Diagnosis not present

## 2022-03-23 DIAGNOSIS — I63013 Cerebral infarction due to thrombosis of bilateral vertebral arteries: Secondary | ICD-10-CM

## 2022-03-23 DIAGNOSIS — Z7189 Other specified counseling: Secondary | ICD-10-CM | POA: Diagnosis not present

## 2022-03-23 DIAGNOSIS — T68XXXA Hypothermia, initial encounter: Secondary | ICD-10-CM

## 2022-03-23 DIAGNOSIS — G8250 Quadriplegia, unspecified: Secondary | ICD-10-CM | POA: Diagnosis not present

## 2022-03-23 DIAGNOSIS — I639 Cerebral infarction, unspecified: Secondary | ICD-10-CM | POA: Diagnosis not present

## 2022-03-23 DIAGNOSIS — Z9911 Dependence on respirator [ventilator] status: Secondary | ICD-10-CM | POA: Diagnosis not present

## 2022-03-23 DIAGNOSIS — G839 Paralytic syndrome, unspecified: Secondary | ICD-10-CM

## 2022-03-23 DIAGNOSIS — G9519 Other vascular myelopathies: Secondary | ICD-10-CM | POA: Diagnosis not present

## 2022-03-23 DIAGNOSIS — E8729 Other acidosis: Secondary | ICD-10-CM | POA: Diagnosis not present

## 2022-03-23 LAB — URINE CULTURE: Culture: NO GROWTH

## 2022-03-23 LAB — ANCA PROFILE
Anti-MPO Antibodies: <0.2 units (ref 0.0–0.9)
Anti-PR3 Antibodies: <0.2 units (ref 0.0–0.9)
Atypical P-ANCA titer: <1:20 {titer}
C-ANCA: <1:20 {titer}
P-ANCA: <1:20 {titer}

## 2022-03-23 LAB — CBC
HCT: 31.2 % — ABNORMAL LOW (ref 36.0–46.0)
Hemoglobin: 10.2 g/dL — ABNORMAL LOW (ref 12.0–15.0)
MCH: 29.3 pg (ref 26.0–34.0)
MCHC: 32.7 g/dL (ref 30.0–36.0)
MCV: 89.7 fL (ref 80.0–100.0)
Platelets: 169 10*3/uL (ref 150–400)
RBC: 3.48 MIL/uL — ABNORMAL LOW (ref 3.87–5.11)
RDW: 14.6 % (ref 11.5–15.5)
WBC: 15 10*3/uL — ABNORMAL HIGH (ref 4.0–10.5)
nRBC: 0 % (ref 0.0–0.2)

## 2022-03-23 LAB — BETA-2-GLYCOPROTEIN I ABS, IGG/M/A
Beta-2 Glyco I IgG: <9 GPI IgG units (ref 0–20)
Beta-2-Glycoprotein I IgA: <9 GPI IgA units (ref 0–25)
Beta-2-Glycoprotein I IgM: 42 GPI IgM units — ABNORMAL HIGH (ref 0–32)

## 2022-03-23 LAB — GLUCOSE, CAPILLARY
Glucose-Capillary: 117 mg/dL — ABNORMAL HIGH (ref 70–99)
Glucose-Capillary: 115 mg/dL — ABNORMAL HIGH (ref 70–99)
Glucose-Capillary: 142 mg/dL — ABNORMAL HIGH (ref 70–99)
Glucose-Capillary: 95 mg/dL (ref 70–99)
Glucose-Capillary: 96 mg/dL (ref 70–99)
Glucose-Capillary: 102 mg/dL — ABNORMAL HIGH (ref 70–99)
Glucose-Capillary: 113 mg/dL — ABNORMAL HIGH (ref 70–99)

## 2022-03-23 LAB — BASIC METABOLIC PANEL
Anion gap: 12 (ref 5–15)
Anion gap: 5 (ref 5–15)
BUN: 7 mg/dL (ref 6–20)
BUN: 8 mg/dL (ref 6–20)
CO2: 25 mmol/L (ref 22–32)
CO2: 26 mmol/L (ref 22–32)
Calcium: 7.9 mg/dL — ABNORMAL LOW (ref 8.9–10.3)
Calcium: 8.6 mg/dL — ABNORMAL LOW (ref 8.9–10.3)
Chloride: 105 mmol/L (ref 98–111)
Chloride: 109 mmol/L (ref 98–111)
Creatinine, Ser: 0.63 mg/dL (ref 0.44–1.00)
Creatinine, Ser: 0.68 mg/dL (ref 0.44–1.00)
GFR, Estimated: >60 mL/min (ref >60)
GFR, Estimated: >60 mL/min (ref >60)
Glucose, Bld: 116 mg/dL — ABNORMAL HIGH (ref 70–99)
Glucose, Bld: 135 mg/dL — ABNORMAL HIGH (ref 70–99)
Potassium: 3.2 mmol/L — ABNORMAL LOW (ref 3.5–5.1)
Potassium: 3.7 mmol/L (ref 3.5–5.1)
Sodium: 140 mmol/L (ref 135–145)
Sodium: 142 mmol/L (ref 135–145)

## 2022-03-23 LAB — CULTURE, RESPIRATORY W GRAM STAIN: Culture: NORMAL

## 2022-03-23 LAB — MAGNESIUM
Magnesium: 1.9 mg/dL (ref 1.7–2.4)
Magnesium: 2.8 mg/dL — ABNORMAL HIGH (ref 1.7–2.4)

## 2022-03-23 LAB — RHEUMATOID FACTOR: Rheumatoid fact SerPl-aCnc: <10 IU/mL (ref <14.0)

## 2022-03-23 LAB — CARDIOLIPIN ANTIBODIES, IGG, IGM, IGA
Anticardiolipin IgA: <9 APL U/mL (ref 0–11)
Anticardiolipin IgG: <9 GPL U/mL (ref 0–14)
Anticardiolipin IgM: <9 MPL U/mL (ref 0–12)

## 2022-03-23 LAB — PHOSPHORUS
Phosphorus: 2.6 mg/dL (ref 2.5–4.6)
Phosphorus: 2.7 mg/dL (ref 2.5–4.6)

## 2022-03-23 LAB — ECHO TEE

## 2022-03-23 LAB — HOMOCYSTEINE: Homocysteine: 12.6 umol/L (ref 0.0–14.5)

## 2022-03-23 LAB — GC/CHLAMYDIA PROBE AMP (~~LOC~~) NOT AT ARMC
Chlamydia: NEGATIVE
Comment: NEGATIVE
Comment: NORMAL
Neisseria Gonorrhea: NEGATIVE

## 2022-03-23 MED ORDER — FENTANYL CITRATE PF 50 MCG/ML IJ SOSY
100.0000 ug | PREFILLED_SYRINGE | INTRAMUSCULAR | Status: DC | PRN
  Administered 2022-03-23: 75 ug via INTRAVENOUS
  Filled 2022-03-23 (×3): qty 2

## 2022-03-23 MED ORDER — ADULT MULTIVITAMIN W/MINERALS CH
1.0000 | ORAL_TABLET | Freq: Every day | ORAL | Status: DC
  Administered 2022-03-25 – 2022-04-14 (×21): 1
  Filled 2022-03-23 (×23): qty 1

## 2022-03-23 MED ORDER — POTASSIUM CHLORIDE 10 MEQ/50ML IV SOLN
10.0000 meq | INTRAVENOUS | Status: AC
  Administered 2022-03-23 (×6): 10 meq via INTRAVENOUS
  Filled 2022-03-23 (×6): qty 50

## 2022-03-23 MED ORDER — VITAL AF 1.2 CAL PO LIQD
1000.0000 mL | ORAL | Status: DC
  Administered 2022-03-24: 1000 mL

## 2022-03-23 MED ORDER — MAGNESIUM SULFATE 2 GM/50ML IV SOLN
2.0000 g | Freq: Once | INTRAVENOUS | Status: AC
  Administered 2022-03-23: 2 g via INTRAVENOUS
  Filled 2022-03-23: qty 50

## 2022-03-23 MED ORDER — NOREPINEPHRINE 4 MG/250ML-% IV SOLN
INTRAVENOUS | Status: AC
  Filled 2022-03-23: qty 250

## 2022-03-23 MED ORDER — MIDAZOLAM HCL 2 MG/2ML IJ SOLN
2.0000 mg | INTRAMUSCULAR | Status: AC | PRN
  Administered 2022-03-23 – 2022-03-27 (×3): 2 mg via INTRAVENOUS
  Filled 2022-03-23 (×3): qty 2

## 2022-03-23 MED ORDER — ADULT MULTIVITAMIN W/MINERALS CH
1.0000 | ORAL_TABLET | Freq: Every day | ORAL | Status: DC
  Filled 2022-03-23: qty 1

## 2022-03-23 MED ORDER — LACTATED RINGERS IV SOLN: INTRAVENOUS | Status: AC

## 2022-03-23 MED ORDER — FREE WATER
200.0000 mL | Status: DC
  Administered 2022-03-24 – 2022-03-25 (×4): 200 mL

## 2022-03-23 MED ORDER — POTASSIUM CHLORIDE 10 MEQ/50ML IV SOLN: 10.0000 meq | INTRAVENOUS | Status: DC

## 2022-03-23 NOTE — Consult Note (Signed)
Palliative Care Consult Note                                  Date: 03/23/2022   Patient Name: Rachael [name]  DOB: 04-13-2000  MRN: [id number]  Age / Sex: 22 y.o., female  PCP: Patient, No Pcp Per Referring Physician: Julian Hy, DO  Reason for Consultation: Establishing goals of care  HPI/Patient Profile: 22 y.o. female  with past medical history of depression and suicidal ideation who presented to [facility] ED with sudden onset of shortness of breath and upper and lower extremity weakness. ED work-up found loss of speaking ability and hypercarbic respiratory failure and was subsequently intubated and transferred to [facility].  She was admitted on 03/12/2022 with acute flaccid paralysis due to anterior spinal cord infarction, acute hypercapnic and hypoxic respiratory failure due to neuromuscular weakness, aspiration pneumonia, hypothermia, shock likely neurogenic and others.   PMT was consulted for [place] conversations.  Past Medical History:  Diagnosis Date   Depression    few yrs ago [facility]), doing good now   Hyperlipidemia    Kidney abscess     Subjective:   This NP Walden Field reviewed medical records, received report from team, assessed the patient and then meet at the patient's bedside to discuss diagnosis, prognosis, GOC, EOL wishes disposition and options.  I saw the patient at the bedside.  She just had a TEE and remains sedated.  Mother had stepped out to run errands.  No other family at bedside.  I did call the patient's mother and speak with her over the phone.   Concept of Palliative Care was introduced as specialized medical care for people and their families living with serious illness.  If focuses on providing relief from the symptoms and stress of a serious illness.  The goal is to improve quality of life for both the patient and the family. Values and goals of care important to patient and family were attempted to be  elicited.  Created space and opportunity for patient  and family to explore thoughts and feelings regarding current medical situation   Natural trajectory and current clinical status were discussed. Questions and concerns addressed. Patient  encouraged to call with questions or concerns.    Patient/Family Understanding of Illness: I spoke with patient's mom on the phone.  We discussed a summary of what is going on with her daughter medically.  We discussed that there will be need for ongoing goals of care discussions pending possible improvement versus nonimprovement.  Life Review: Deferred  Today's Discussion: In addition to discussions described above we had substantial discussion about various topics.  The patient is currently able to mouth words and has full sensation.  However, she does not have any movement.  We discussed our desire for the patient to be able to participate in conversations surrounding her care and her goals.  We discussed that there are ongoing attempts for things such as an incline Passy-Muir valve, early speech evaluation, etc. to facilitate this.  We discussed the need to plan a general course with check-in's at desired intervals to decide on how to proceed at age point.  I shared that our goal is always to do the best we can for the patient in line with that the patient wishes.  The patient's mother shares that she does not want to make decisions for her daughter and is just trying to understand and  supports what ever her daughter wishes for her care.  We scheduled a time to meet for further discussions tomorrow 03/24/2022 at 1:00 PM.  I provided emotional and general support through therapeutic listening, empathy, sharing of stories, and other techniques. I answered all questions and addressed all concerns to the best of my ability.  Review of Systems  Unable to perform ROS: Other  (Remains sleeping/sedated from TEE)  Objective:   Primary Diagnoses: Present on  Admission:  Altered mental state   Physical Exam Vitals and nursing note reviewed.  Constitutional:      General: She is sleeping. She is not in acute distress. HENT:     Head: Normocephalic and atraumatic.  Cardiovascular:     Rate and Rhythm: Normal rate.  Pulmonary:     Effort: No respiratory distress.  Abdominal:     General: Abdomen is flat.     Vital Signs:  BP 126/71   Pulse 72   Temp (!) 96.8 F (36 C)   Resp (!) 26   Ht 5' 7"$  (1.702 m)   Wt 101.2 kg Comment: Bed changed 2/18, previous weight innacurate.  SpO2 92%   BMI 34.94 kg/m   Palliative Assessment/Data: 10-30% (currently with [employer])    Advanced Care Planning:   Existing Vynca/ACP Documentation: None  Primary Decision Maker: PATIENT  Code Status/Advance Care Planning: Full code  A discussion was had today regarding advanced directives. Concepts specific to code status, artifical feeding and hydration, continued IV antibiotics and rehospitalization was had.  The difference between a aggressive medical intervention path and a palliative comfort care path for this patient at this time was had.   Decisions/Changes to ACP: None today  Assessment & Plan:   Impression: 22 year old female with unfortunate situation of an anterior cervical spine CVA resulting in flaccid paralysis, respiratory failure.  She is currently on the ventilator via trach which was placed yesterday.  She has a core track tube in but there are plans for possible PEG tube later this week.  We will meet with the patient and her family tomorrow to discuss goals of care.  Questionable neurological outlook, although typically will have maximum improvement within 3 months although some improvement could be expected up to 1 year.  Unsure if the patient is willing to wait this long.  We will try to clarify this better tomorrow.  Overall long-term prognosis is guarded to poor.  SUMMARY OF RECOMMENDATIONS   Remain full code for now Full  scope of care Attempts at inline Passy-Muir valve to facilitate patient participation in discussions around goals of care Family meeting tomorrow at 1:00 PMT will continue to follow  Symptom Management:  Per primary team PMT is available to assist as needed  Prognosis:  Unable to determine  Discharge Planning:  To Be Determined   Discussed with: Patient's family, medical team, nursing team    Thank you for allowing [place] to participate in the care of Alexondra Gamby PMT will continue to support holistically.  Time Total: 60 min  Greater than 50%  of this time was spent counseling and coordinating care related to the above assessment and plan.  Signed by: Walden Field, NP Palliative Medicine Team  Team Phone # [phone] (Nights/Weekends)  03/23/2022, 3:46 PM

## 2022-03-23 NOTE — Progress Notes (Signed)
  Echocardiogram Echocardiogram Transesophageal has been performed.  Rachael [name] 03/23/2022, 12:24 PM

## 2022-03-23 NOTE — Progress Notes (Signed)
NAME:  Rachael [name], MRN:  [id number], DOB:  [birth year], LOS: 4 ADMISSION DATE:  03/10/2022, CONSULTATION DATE:  03/23/22 REFERRING MD:  drawbridge, CHIEF COMPLAINT:  shortness of breath and weakness   History of Present Illness:  Rachael [name] is a 22 y.o. F with history of depression and suicidal ideation who presented to [place] ED with sudden onset of shortness of breath and upper and lower extremity weakness.  History taken from Epic as patient is intubated and no family is at the bedside.  She initially had a headache and back pain prior to onset but denied abdominal pain or chest pain.  Vitals showed an initial temp of 28F, bloodwork with WBC 18k, UDS + for bezos and THC.   Her symptoms progressed to the point she wasn't speaking and ABG showed hypercarbic respiratory failure so pt was intubated.  She had some bradycardia and hypotension post-intubation, so was started on peripheral Levophed and given atropine during transport   Pertinent  Medical History   has a past medical history of Depression, Hyperlipidemia, and Kidney abscess.   Significant Hospital Events: Including procedures, antibiotic start and stop dates in addition to other pertinent events   2/16 presented to [facility] with all weakness and shortness of breath requiring intubation and transfer to [facility] 2/18 episodes of desaturations and hypercarbia unexplained 2/19 trach, cortrak, PICC  Interim History / Subjective:  Complains of discomfort from NGT, but better than ETT.  TF off this morning for TEE.  Hypothermic overnight.   Objective   Blood pressure 123/79, pulse 63, temperature 97.7 F (36.5 C), resp. rate (!) 24, height 5' 7"$  (1.702 m), weight 101.2 kg, SpO2 97 %.    Vent Mode: PRVC FiO2 (%):  [50 %-100 %] 60 % Set Rate:  [24 bmp] 24 bmp Vt Set:  [490 mL] 490 mL PEEP:  [10 cmH20] 10 cmH20 Plateau Pressure:  [21 cmH20-29 cmH20] 27 cmH20   Intake/Output Summary (Last 24 hours) at 03/23/2022 1038 Last data filed  at 03/23/2022 0800 Gross per 24 hour  Intake 3844.5 ml  Output 1605 ml  Net 2239.5 ml    Filed Weights   03/20/22 0000 03/21/22 0400 03/22/22 0500  Weight: 73.9 kg 75 kg 101.2 kg    General:  critically ill appearing woman lying in bed in NAD HEENT: [place]/AT, eyes anicteric Neuro:  awake, alert, trying to talk, unable to move arms or legs, not turning head but tracking with her eyes. Answers are appropriate. 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 Acute hypercapnic and hypoxic respiratory failure due to NM weakness Neurogenic bladder, urinary retention - etiology remains unclear; risk factors include heavy smoking, birth control, HLD -Appreciate neurology's management -TEE today -aspirin, statin (reassess long-term given teratogenic effects) -con't supportive care, consult for PEG tube -can start plavix after PEG -OB consulted to remove Implanon - hypercoagulable, Lyme, and autoimmune workup pending -foley; will eventually require bladder protocol - maintain neuro protective  measures; goal for eurothermia, euglycemia, eunatermia, normoxia, and PCO2 goal of 35-40 -supportive care -PT, OT -palliative consult to discuss her wishes and establish timelines of her treatments if she wishes; we had discussed this with her and her mother tomorrow  Acute hypoxic & hypercarbic respiratory failure requiring MV due to NM weakness from C  spine stroke. Acute hypercapnia with acute respiratory acidosis Aspiration pneumonia RLL -trach care per protocol -remove sutures after 7 days -antibiotics x 7 days, follow cultures -LTVV -VAP prevention protocol -PAD protocol for sedation -vent weaning efforts when FiO2 is reduced -CPT & hypertonic saline; anticipate she will require a pulmonary clearance regimen  Hypothermia, bradycardia Shock likely neurogenic -NE to maintain MAP >65 -empiric antibiotics -external warming PRN  Polysubstance abuse- tobacco, MJ. Has prescribed opiates and benzos. ETOH abuse  At risk for withdrawals  - con't zoloft and klonopin per tube to help with anxiet - cessation counseling as appropriate -vitamins -monitor for withdrawal  Loose stools -bowel regimen PRN -high risk for constipation with immobility, need for pain meds  Anemia due to critical illness -transfuse for Hb <7 or hemodynamically significant bleeding  Vaginal discharge -follow up GC/chl probe  Hyperglycemia -SSI PRN -goal BG <180  Mother updated at bedside this morning. Palliative care consulted; will meet with her and mom tomorrow afternoon.  Best Practice (right click and "Reselect all SmartList Selections" daily)   Diet/type: tubefeeds DVT prophylaxis: LMWH GI prophylaxis: PPI Lines: Central line Foley:   Code Status:  full code Last date of multidisciplinary goals of care discussion [ mother updated at bedside 2/20]  Mother and patient updated on plan of care 2/19 Mother [name]- [place]   CBC: Recent Labs  Lab 03/06/2022 1825 03/21/2022 1919 03/20/22 1010 03/21/22 0127 03/21/22 1140 03/21/22 2255 03/22/22 0154 03/22/22 0611 03/23/22 0417  WBC 18.3*  --  13.5*  --   --   --  20.0* 20.3* 15.0*  NEUTROABS 15.0*  --   --   --   --   --   --   --   --   HGB 13.8   < > 11.1*   < > 10.5* 11.6* 12.0 11.2* 10.2*  HCT 41.5   < > 34.9*   < > 31.0* 34.0* 37.0 34.4* 31.2*  MCV 84.3  --  87.5  --   --    --  88.7 87.3 89.7  PLT 299  --  221  --   --   --  177 193 169   < > = values in this interval not displayed.     Basic Metabolic Panel: Recent Labs  Lab 03/20/22 1010 03/21/22 0127 03/21/22 0339 03/21/22 0359 03/21/22 1140 03/21/22 2255 03/22/22 0154 03/22/22 0611 03/22/22 2350 03/23/22 0550  NA 138   < > 138   < > 138 140  --  139 140 142  K 3.8   < > 4.4   < > 3.5 3.8  --  3.6 3.2* 3.7  CL 110  --  109  --   --   --   --  106 109 105  CO2 20*  --  20*  --   --   --   --  25 26 25  $ GLUCOSE 102*  --  103*  --   --   --   --  146* 135* 116*  BUN <5*  --  7  --   --   --   --  9  7 8  CREATININE 0.76  --  0.80  --   --   --   --  0.62 0.63 0.68  CALCIUM 8.0*  --  8.4*  --   --   --   --  8.6* 7.9* 8.6*  MG 1.8  --  2.3  --   --   --  2.2  --  1.9 2.8*  PHOS 3.7  --  3.4  --   --   --  1.4*  --  2.6 2.7   < > = values in this interval not displayed.    GFR: Estimated Creatinine Clearance: 135.9 mL/min (by C-G formula based on SCr of 0.68 mg/dL). Recent Labs  Lab 03/15/2022 1829 03/20/22 0030 03/20/22 1010 03/21/22 0339 03/22/22 0154 03/22/22 0611 03/23/22 0417  PROCALCITON  --   --  <0.10 <0.10 <0.10  --   --   WBC  --   --  13.5*  --  20.0* 20.3* 15.0*  LATICACIDVEN 0.7 1.8  --   --   --   --   --       Critical care time: 43 min.    Julian Hy, DO 03/23/22 3:58 PM [facility]  For contact information, see Amion. If no response to pager, please call PCCM consult pager. After hours, 7PM- 7AM, please call Elink.

## 2022-03-23 NOTE — Progress Notes (Signed)
STROKE TEAM PROGRESS NOTE   SUBJECTIVE (INTERVAL HISTORY) Her boyfriend is at the bedside.  Pt lying in bed, awake alert, on vent, with RR setting at 26. Pt asking for drink and was hungry. Plan for PEG tomorrow. Still has quadriplegia. TEE unremarkable.    OBJECTIVE Temp:  [96.3 F (35.7 C)-98.2 F (36.8 C)] 96.8 F (36 C) (02/20 1600) Pulse Rate:  [52-92] 92 (02/20 1600) Cardiac Rhythm: Sinus bradycardia (02/20 1600) Resp:  [21-26] 26 (02/20 1600) BP: (75-132)/(11-91) 110/61 (02/20 1600) SpO2:  [88 %-100 %] 90 % (02/20 1600) FiO2 (%):  [60 %-100 %] 60 % (02/20 1546)  Recent Labs  Lab 03/22/22 2357 03/23/22 0432 03/23/22 0756 03/23/22 1227 03/23/22 1551  GLUCAP 142* 117* 115* 95 96   Recent Labs  Lab 03/20/22 1010 03/21/22 0127 03/21/22 0339 03/21/22 0359 03/21/22 1140 03/21/22 2255 03/22/22 0154 03/22/22 0611 03/22/22 2350 03/23/22 0550  NA 138   < > 138   < > 138 140  --  139 140 142  K 3.8   < > 4.4   < > 3.5 3.8  --  3.6 3.2* 3.7  CL 110  --  109  --   --   --   --  106 109 105  CO2 20*  --  20*  --   --   --   --  25 26 25  $ GLUCOSE 102*  --  103*  --   --   --   --  146* 135* 116*  BUN <5*  --  7  --   --   --   --  9 7 8  $ CREATININE 0.76  --  0.80  --   --   --   --  0.62 0.63 0.68  CALCIUM 8.0*  --  8.4*  --   --   --   --  8.6* 7.9* 8.6*  MG 1.8  --  2.3  --   --   --  2.2  --  1.9 2.8*  PHOS 3.7  --  3.4  --   --   --  1.4*  --  2.6 2.7   < > = values in this interval not displayed.   Recent Labs  Lab 03/31/2022 1825  AST 14*  ALT 14  ALKPHOS 74  BILITOT 0.6  PROT 7.6  ALBUMIN 4.6   Recent Labs  Lab 03/21/2022 1825 03/05/2022 1919 03/20/22 1010 03/21/22 0127 03/21/22 1140 03/21/22 2255 03/22/22 0154 03/22/22 0611 03/23/22 0417  WBC 18.3*  --  13.5*  --   --   --  20.0* 20.3* 15.0*  NEUTROABS 15.0*  --   --   --   --   --   --   --   --   HGB 13.8   < > 11.1*   < > 10.5* 11.6* 12.0 11.2* 10.2*  HCT 41.5   < > 34.9*   < > 31.0* 34.0* 37.0  34.4* 31.2*  MCV 84.3  --  87.5  --   --   --  88.7 87.3 89.7  PLT 299  --  221  --   --   --  177 193 169   < > = values in this interval not displayed.   No results for input(s): "CKTOTAL", "CKMB", "CKMBINDEX", "TROPONINI" in the last 168 hours. No results for input(s): "LABPROT", "INR" in the last 72 hours.  No results for input(s): "COLORURINE", "LABSPEC", "PHURINE", "GLUCOSEU", "HGBUR", "BILIRUBINUR", "KETONESUR", "  PROTEINUR", "UROBILINOGEN", "NITRITE", "LEUKOCYTESUR" in the last 72 hours.  Invalid input(s): "APPERANCEUR"      Component Value Date/Time   CHOL 176 03/20/2022 1010   TRIG 138 03/20/2022 1010   TRIG 140 03/20/2022 1010   HDL 37 (L) 03/20/2022 1010   CHOLHDL 4.8 03/20/2022 1010   VLDL 28 03/20/2022 1010   LDLCALC 111 (H) 03/20/2022 1010   Lab Results  Component Value Date   HGBA1C 5.3 03/20/2022      Component Value Date/Time   LABOPIA POSITIVE (A) 03/25/2022 1825   COCAINSCRNUR NONE DETECTED 03/08/2022 1825   LABBENZ POSITIVE (A) 03/09/2022 1825   AMPHETMU NONE DETECTED 03/15/2022 1825   THCU POSITIVE (A) 03/16/2022 1825   LABBARB NONE DETECTED 03/16/2022 1825    Recent Labs  Lab 03/30/2022 1825  ETH <10    I have personally reviewed the radiological images below and agree with the radiology interpretations.  ECHO TEE  Result Date: 03/23/2022    TRANSESOPHOGEAL ECHO REPORT   Patient Name:   ANABELL CHOLICO Date of Exam: 03/23/2022 Medical Rec #:  [id number]        Height:       67.0 in Accession #:    [id number]       Weight:       223.1 lb Date of Birth:  Apr 23, 2000         BSA:          2.118 m Patient Age:    22 years         BP:           134/79 mmHg Patient Gender: F                HR:           65 bpm. Exam Location:  Inpatient Procedure: Transesophageal Echo, Color Doppler, Cardiac Doppler and Saline            Contrast Bubble Study Indications:    Stroke  History:        Patient has prior history of Echocardiogram examinations, most                  recent 03/20/2022. Risk Factors:Dyslipidemia. Opiates, benzos,                 THC use; Nexplanon.  Sonographer:    Eartha Inch Referring Phys: [id number] Margie Billet PROCEDURE: After discussion of the risks and benefits of a TEE, an informed consent was obtained from the patient. The patient was recently extubated. The transesophogeal probe was passed without difficulty through the esophogus of the patient. Imaged were obtained with the patient in a supine position. Sedation performed by performing physician. Patients was under conscious sedation during this procedure. Anesthetic administered: 54mg of Fentanyl, 4.070mof Versed. Image quality was good. The patient's vital signs; including heart rate, blood pressure, and oxygen saturation; remained stable throughout the procedure. The patient developed no complications during the procedure.  IMPRESSIONS  1. Left ventricular ejection fraction, by estimation, is 55 to 60%. The left ventricle has normal function.  2. Right ventricular systolic function is normal. The right ventricular size is normal.  3. No left atrial/left atrial appendage thrombus was detected.  4. The mitral valve is normal in structure. Trivial mitral valve regurgitation.  5. The aortic valve is tricuspid. Aortic valve regurgitation is trivial.  6. Agitated saline contrast bubble study was negative, with no evidence of any interatrial shunt both at rest and with valsalva. There  was one late bubble consistent with a small intrapulmonary shunt. FINDINGS  Left Ventricle: Left ventricular ejection fraction, by estimation, is 55 to 60%. The left ventricle has normal function. The left ventricular internal cavity size was normal in size. Right Ventricle: The right ventricular size is normal. No increase in right ventricular wall thickness. Right ventricular systolic function is normal. Left Atrium: Left atrial size was normal in size. No left atrial/left atrial appendage thrombus was detected. Right  Atrium: Right atrial size was normal in size. Pericardium: There is no evidence of pericardial effusion. Mitral Valve: The mitral valve is normal in structure. Trivial mitral valve regurgitation. Tricuspid Valve: The tricuspid valve is normal in structure. Tricuspid valve regurgitation is trivial. Aortic Valve: The aortic valve is tricuspid. Aortic valve regurgitation is trivial. Pulmonic Valve: The pulmonic valve was normal in structure. Pulmonic valve regurgitation is trivial. Aorta: The aortic root is normal in size and structure. IAS/Shunts: No atrial level shunt detected by color flow Doppler. Agitated saline contrast was given intravenously to evaluate for intracardiac shunting. Agitated saline contrast bubble study was negative, with no evidence of any interatrial shunt. Additional Comments: Spectral Doppler performed. Gwyndolyn Kaufman MD Electronically signed by Gwyndolyn Kaufman MD Signature Date/Time: 03/23/2022/12:56:54 PM    Final    DG Abd Portable 1V  Result Date: 03/22/2022 CLINICAL DATA:  Encounter for feeding tube placement. EXAM: PORTABLE ABDOMEN - 1 VIEW COMPARISON:  None Available. FINDINGS: The tip of the weighted enteric tube is just to the right of midline in the region of the distal stomach or proximal duodenum. There is mild gaseous distention of bowel in the central abdomen. IMPRESSION: Tip of the weighted enteric tube in the distal stomach or proximal duodenum. Electronically Signed   By: Keith Rake M.D.   On: 03/22/2022 13:35   DG Chest Port 1 View  Result Date: 03/22/2022 CLINICAL DATA:  Respiratory failure and status post tracheostomy. EXAM: PORTABLE CHEST 1 VIEW COMPARISON:  Film earlier today at 0758 hours FINDINGS: Stable heart size. Interval tracheostomy and removal of endotracheal tube. The tracheostomy tube appears in appropriate position. Standard gastric decompression tube has been removed and a Dobbhoff feeding tube placed. Relatively stable right lower lobe  atelectasis. No pneumothorax. IMPRESSION: Appropriate position of tracheostomy tube. Stable right lower lobe atelectasis. Removal of standard gastric decompression tube and placement of Dobbhoff feeding tube. Electronically Signed   By: Aletta Edouard M.D.   On: 03/22/2022 12:50   [place] EKG SITE RITE  Result Date: 03/22/2022 If Site Rite image not attached, placement could not be confirmed due to current cardiac rhythm.  DG Chest Port 1 View  Result Date: 03/22/2022 CLINICAL DATA:  Respiratory failure EXAM: PORTABLE CHEST 1 VIEW COMPARISON:  Chest radiograph 1 day prior FINDINGS: Endotracheal tube tip is proximally 3.1 cm from the carina. The enteric catheter tip is off field of view The cardiomediastinal silhouette is normal There is a small right pleural effusion which may be slightly increased since prior study. Hazy opacities in the right lower lobe are favored to reflect atelectasis. The previously seen small left pleural effusion appears resolved with improved aeration of the left lower lobe. Aeration is otherwise unchanged. There is no pneumothorax The bones are stable. IMPRESSION: 1. New small right pleural effusion with probable adjacent atelectasis. 2. Resolved small left pleural effusion with improved aeration of the left lower lobe. Electronically Signed   By: Valetta Mole M.D.   On: 03/22/2022 08:18   VAS [place] LOWER EXTREMITY VENOUS (DVT)  Result Date: 03/21/2022  Lower Venous DVT Study Patient Name:  SUMAIRA SURRIDGE  Date of Exam:   03/21/2022 Medical Rec #: [id number]         Accession #:    [id number] Date of Birth: Nov 22, 2000          Patient Gender: F Patient Age:   74 years Exam Location:  [facility] Procedure:      VAS [place] LOWER EXTREMITY VENOUS (DVT) Referring Phys: Cornelius Moras Catalaya Garr --------------------------------------------------------------------------------  Other Indications: Spinal stroke, paralysis. Limitations: Ventilation, paralysis. Comparison Study: No prior study on file  Performing Technologist: Sharion Dove RVS  Examination Guidelines: A complete evaluation includes B-mode imaging, spectral Doppler, color Doppler, and power Doppler as needed of all accessible portions of each vessel. Bilateral testing is considered an integral part of a complete examination. Limited examinations for reoccurring indications may be performed as noted. The reflux portion of the exam is performed with the patient in reverse Trendelenburg.  +---------+---------------+---------+-----------+----------+-------------------+ RIGHT    CompressibilityPhasicitySpontaneityPropertiesThrombus Aging      +---------+---------------+---------+-----------+----------+-------------------+ CFV      Full           Yes      Yes                                      +---------+---------------+---------+-----------+----------+-------------------+ SFJ      Full                                                             +---------+---------------+---------+-----------+----------+-------------------+ FV Prox  Full                                                             +---------+---------------+---------+-----------+----------+-------------------+ FV Mid   Full                                                             +---------+---------------+---------+-----------+----------+-------------------+ FV DistalFull                                                             +---------+---------------+---------+-----------+----------+-------------------+ PFV      Full                                                             +---------+---------------+---------+-----------+----------+-------------------+ POP                     Yes      Yes  patent by color and                                                       Doppler             +---------+---------------+---------+-----------+----------+-------------------+ PTV      Full                                                              +---------+---------------+---------+-----------+----------+-------------------+ PERO     Full                                                             +---------+---------------+---------+-----------+----------+-------------------+   +---------+---------------+---------+-----------+----------+--------------+ LEFT     CompressibilityPhasicitySpontaneityPropertiesThrombus Aging +---------+---------------+---------+-----------+----------+--------------+ CFV      Full           Yes      Yes                                 +---------+---------------+---------+-----------+----------+--------------+ SFJ      Full                                                        +---------+---------------+---------+-----------+----------+--------------+ FV Prox  Full                                                        +---------+---------------+---------+-----------+----------+--------------+ FV Mid   Full                                                        +---------+---------------+---------+-----------+----------+--------------+ FV DistalFull                                                        +---------+---------------+---------+-----------+----------+--------------+ PFV      Full                                                        +---------+---------------+---------+-----------+----------+--------------+ POP      Full           Yes  Yes                                 +---------+---------------+---------+-----------+----------+--------------+ PTV      Full                                                        +---------+---------------+---------+-----------+----------+--------------+ PERO     Full                                                        +---------+---------------+---------+-----------+----------+--------------+     Summary: BILATERAL: - No evidence of deep vein  thrombosis seen in the lower extremities, bilaterally. -No evidence of popliteal cyst, bilaterally.   *See table(s) above for measurements and observations. Electronically signed by Deitra Mayo MD on 03/21/2022 at 8:20:57 PM.    Final    DG Chest Port 1 View  Result Date: 03/21/2022 CLINICAL DATA:  Intubation.  Respiratory distress. EXAM: PORTABLE CHEST 1 VIEW COMPARISON:  Chest radiograph dated 03/20/2022. FINDINGS: Endotracheal tube with tip approximately 4 cm above the carina. Enteric tube extends below the diaphragm with tip beyond the inferior margin of the image. Improved aeration of the left lower lobe since the earlier radiograph. Minimal right base atelectasis. No pleural effusion or pneumothorax. The cardiac silhouette is within limits. No acute osseous pathology. IMPRESSION: 1. Endotracheal tube above the carina. 2. Improved aeration of the left lower lobe. Electronically Signed   By: Anner Crete M.D.   On: 03/21/2022 01:10   ECHOCARDIOGRAM COMPLETE  Result Date: 03/20/2022    ECHOCARDIOGRAM REPORT   Patient Name:   NYA BALAM Date of Exam: 03/20/2022 Medical Rec #:  [id number]        Height:       67.0 in Accession #:    [id number]       Weight:       162.9 lb Date of Birth:  [birth year]         BSA:          1.853 m Patient Age:    22 years         BP:           100/52 mmHg Patient Gender: F                HR:           63 bpm. Exam Location:  Inpatient Procedure: 2D Echo, Cardiac Doppler, Color Doppler and Strain Analysis Indications:    Stroke I63.9  History:        Patient has no prior history of Echocardiogram examinations.  Sonographer:    Perkins Referring Phys: [id number] [facility]  Sonographer Comments: Echo performed with patient supine and on artificial respirator. IMPRESSIONS  1. Left ventricular ejection fraction, by estimation, is 55 to 60%. The left ventricle has normal function. The left ventricle has no regional wall motion abnormalities. Left  ventricular diastolic parameters were normal. The average left ventricular global longitudinal strain is -25.9 %. The global longitudinal strain is normal.  2. Right ventricular systolic function is normal. The right ventricular size  is normal. There is normal pulmonary artery systolic pressure.  3. The mitral valve is normal in structure. No evidence of mitral valve regurgitation. No evidence of mitral stenosis.  4. The aortic valve is tricuspid. Aortic valve regurgitation is not visualized. No aortic stenosis is present.  5. The inferior vena cava is normal in size with greater than 50% respiratory variability, suggesting right atrial pressure of 3 mmHg. FINDINGS  Left Ventricle: Left ventricular ejection fraction, by estimation, is 55 to 60%. The left ventricle has normal function. The left ventricle has no regional wall motion abnormalities. The average left ventricular global longitudinal strain is -25.9 %. The global longitudinal strain is normal. The left ventricular internal cavity size was normal in size. There is no left ventricular hypertrophy. Left ventricular diastolic parameters were normal. Right Ventricle: The right ventricular size is normal. No increase in right ventricular wall thickness. Right ventricular systolic function is normal. There is normal pulmonary artery systolic pressure. The tricuspid regurgitant velocity is 2.35 m/s, and  with an assumed right atrial pressure of 8 mmHg, the estimated right ventricular systolic pressure is 0000000 mmHg. Left Atrium: Left atrial size was normal in size. Right Atrium: Right atrial size was normal in size. Pericardium: There is no evidence of pericardial effusion. Mitral Valve: The mitral valve is normal in structure. No evidence of mitral valve regurgitation. No evidence of mitral valve stenosis. Tricuspid Valve: The tricuspid valve is normal in structure. Tricuspid valve regurgitation is mild . No evidence of tricuspid stenosis. Aortic Valve: The aortic  valve is tricuspid. Aortic valve regurgitation is not visualized. No aortic stenosis is present. Pulmonic Valve: The pulmonic valve was normal in structure. Pulmonic valve regurgitation is not visualized. No evidence of pulmonic stenosis. Aorta: The aortic root is normal in size and structure. Venous: The inferior vena cava is normal in size with greater than 50% respiratory variability, suggesting right atrial pressure of 3 mmHg. IAS/Shunts: No atrial level shunt detected by color flow Doppler.  LEFT VENTRICLE PLAX 2D LVIDd:         4.70 cm   Diastology LVIDs:         2.60 cm   LV e' medial:    9.25 cm/s LV PW:         1.10 cm   LV E/e' medial:  8.8 LV IVS:        1.00 cm   LV e' lateral:   19.60 cm/s LVOT diam:     2.30 cm   LV E/e' lateral: 4.1 LV SV:         85 LV SV Index:   46        2D Longitudinal Strain LVOT Area:     4.15 cm  2D Strain GLS Avg:     -25.9 %  RIGHT VENTRICLE             IVC RV S prime:     12.60 cm/s  IVC diam: 2.00 cm TAPSE (M-mode): 2.2 cm LEFT ATRIUM             Index        RIGHT ATRIUM           Index LA diam:        3.40 cm 1.83 cm/m   RA Area:     14.90 cm LA Vol (A2C):   47.5 ml 25.63 ml/m  RA Volume:   38.50 ml  20.77 ml/m LA Vol (A4C):   29.8 ml 16.08 ml/m LA  Biplane Vol: 39.9 ml 21.53 ml/m  AORTIC VALVE LVOT Vmax:   97.45 cm/s LVOT Vmean:  62.650 cm/s LVOT VTI:    0.206 m  AORTA Ao Root diam: 2.70 cm Ao Asc diam:  2.30 cm Ao Desc diam: 2.00 cm MITRAL VALVE               TRICUSPID VALVE MV Area (PHT): 2.91 cm    TR Peak grad:   22.1 mmHg MV Decel Time: 261 msec    TR Vmax:        235.00 cm/s MV E velocity: 81.20 cm/s MV A velocity: 52.70 cm/s  SHUNTS MV E/A ratio:  1.54        Systemic VTI:  0.21 m                            Systemic Diam: 2.30 cm Jenkins Rouge MD Electronically signed by Jenkins Rouge MD Signature Date/Time: 03/20/2022/11:01:55 AM    Final    DG CHEST PORT 1 VIEW  Result Date: 03/20/2022 CLINICAL DATA:  Shortness of breath EXAM: PORTABLE CHEST 1 VIEW  COMPARISON:  03/05/2022 FINDINGS: Endotracheal tube with tip between the clavicular heads and carina. An enteric tube reaches the stomach. Artifact from EKG leads. Linear opacity in the right mid lung. Dense opacity with volume loss at the left base. No pneumothorax IMPRESSION: 1. Worsening left lower lobe aeration with volume loss. Mild atelectasis on the right. 2. Unremarkable hardware positioning. Electronically Signed   By: Jorje Guild M.D.   On: 03/20/2022 06:42   MR CERVICAL SPINE W WO CONTRAST  Result Date: 03/20/2022 CLINICAL DATA:  Myelopathy EXAM: MRI CERVICAL SPINE WITHOUT AND WITH CONTRAST TECHNIQUE: Multiplanar and multiecho pulse sequences of the cervical spine, to include the craniocervical junction and cervicothoracic junction, were obtained without and with intravenous contrast. CONTRAST:  7.5 mL Gadavist COMPARISON:  No prior MRI of the cervical spine, correlation is made with same day MRI head and CT cervical spine FINDINGS: Alignment: No listhesis. Vertebrae: No fracture, evidence of discitis, or bone lesion. Cord: Restricted diffusion with ADC correlate along the bilateral anterior aspect of the spinal cord from the craniocervical junction (series 6, image 93) to the level of C7 (series 6, image 56). This correlates with increased T2 hyperintense signal, most likely in the anterior horn cells. Spinal cord is otherwise normal in caliber and signal. No abnormal enhancement. Posterior Fossa, vertebral arteries, paraspinal tissues: Negative. Disc levels: Preserved disc height and disc hydration. No spinal canal stenosis or neural foraminal narrowing. 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 These findings were discussed by telephone on 03/20/2022 at 3:30 is am with provider [facility] .  Electronically Signed   By: Merilyn Baba M.D.   On: 03/20/2022 03:54   MR BRAIN WO CONTRAST  Result Date: 03/20/2022 CLINICAL DATA:  Altered mental status, stroke suspected EXAM: MRI HEAD WITHOUT CONTRAST TECHNIQUE: Multiplanar, multiecho pulse sequences of the brain and surrounding structures were obtained without intravenous contrast. COMPARISON:  None Available. FINDINGS: Brain: Restricted diffusion with ADC correlate in the anteromedial cervical spinal cord (series 5, image 54) at the craniocervical junction. No other restricted diffusion to suggest acute or subacute infarct. No acute hemorrhage, mass, mass effect, or midline shift. No hydrocephalus or extra-axial collection. Normal pituitary  and craniocervical junction. No hemosiderin deposition to suggest remote hemorrhage. Normal cerebral volume for age. Vascular: Normal arterial flow voids. Skull and upper cervical spine: Normal marrow signal. Sinuses/Orbits: Clear paranasal sinuses. No acute finding in the orbits. Other: Trace fluid in the right mastoid air cells. IMPRESSION: 1. 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 Attention on subsequent MRI cervical spine. 2. No other acute intracranial process. These findings were discussed by telephone on 03/20/2022 at 3:05 am with provider [facility] . Electronically Signed   By: Merilyn Baba M.D.   On: 03/20/2022 03:14   CT C-SPINE NO CHARGE  Result Date: 03/20/2022 CLINICAL DATA:  Stroke code, concern for cervical pathology EXAM: CT CERVICAL SPINE WITHOUT CONTRAST TECHNIQUE: Multidetector CT imaging of the cervical spine was performed without intravenous contrast. Multiplanar CT image reconstructions were also generated. RADIATION DOSE REDUCTION: This exam was performed according to the departmental dose-optimization program which includes automated exposure control, adjustment of the  mA and/or kV according to patient size and/or use of iterative reconstruction technique. COMPARISON:  None Available. FINDINGS: Alignment: No listhesis. Straightening of the normal cervical lordosis, which may be positional. Skull base and vertebrae: No acute fracture. No primary bone lesion or focal pathologic process. Soft tissues and spinal canal: No prevertebral fluid or swelling. No visible canal hematoma. Disc levels: Disc heights are preserved. No significant degenerative changes. No significant spinal canal stenosis or neural foraminal narrowing. Upper chest: No focal pulmonary opacity or pleural effusion. Other: Endotracheal and orogastric tubes noted. IMPRESSION: No acute or chronic abnormality in the cervical spine. Electronically Signed   By: Merilyn Baba M.D.   On: 03/20/2022 03:00   CT ANGIO HEAD NECK W WO CM W PERF (CODE STROKE)  Result Date: 03/20/2022 CLINICAL DATA:  Stroke suspected, extremity numbness and weakness EXAM: CT ANGIOGRAPHY HEAD AND NECK CT PERFUSION TECHNIQUE: Multidetector CT imaging of the head and neck was performed using the standard protocol during bolus administration of intravenous contrast. Multiplanar CT image reconstructions and MIPs were obtained to evaluate the vascular anatomy. Carotid stenosis measurements (when applicable) are obtained utilizing NASCET criteria, using the distal internal carotid diameter as the denominator. Multiphase CT imaging of the brain was performed following IV bolus contrast injection. Subsequent parametric perfusion maps were calculated using RAPID software. RADIATION DOSE REDUCTION: This exam was performed according to the departmental dose-optimization program which includes automated exposure control, adjustment of the mA and/or kV according to patient size and/or use of iterative reconstruction technique. CONTRAST:  80 mL Omnipaque 300 COMPARISON:  No prior CTA, correlation is made with CT head 03/20/2022 FINDINGS: CT HEAD FINDINGS For  noncontrast findings, please see same day CT head. CTA NECK FINDINGS Aortic arch: Two-vessel arch with a common origin of the brachiocephalic and left common carotid arteries. Imaged portion shows no evidence of aneurysm or dissection. No significant stenosis of the major arch vessel origins. Right carotid system: No evidence of stenosis, dissection, or occlusion. Left carotid system: No evidence of stenosis, dissection, or occlusion. Vertebral arteries: No evidence of stenosis, dissection, or occlusion. Skeleton: No acute osseous abnormality.  No visible canal hematoma. Other neck: Endotracheal and orogastric tubes noted. Otherwise negative. Upper chest: Negative. Review of the MIP images confirms the above findings CTA HEAD FINDINGS Anterior circulation: Both internal carotid arteries are patent to the termini, without significant stenosis. A1 segments patent. Normal anterior communicating artery. Anterior cerebral arteries are patent to their  distal aspects. No M1 stenosis or occlusion. MCA branches perfused and symmetric. Posterior circulation: Vertebral arteries patent to the vertebrobasilar junction without stenosis. Posterior inferior cerebellar arteries patent proximally. Basilar patent to its distal aspect. Superior cerebellar arteries patent proximally. Patent P1 segments. PCAs perfused to their distal aspects without stenosis. The bilateral posterior communicating arteries are diminutive but patent. Venous sinuses: As permitted by contrast timing, patent. Anatomic variants: None significant. Review of the MIP images confirms the above findings CT Brain Perfusion Findings: ASPECTS: 10 CBF (<30%) Volume: 71m Perfusion (Tmax>6.0s) volume: 059mMismatch Volume: 47m89mnfarction Location:None IMPRESSION: 1. No intracranial large vessel occlusion or significant stenosis. 2. No hemodynamically significant stenosis in the neck. 3. No evidence of infarct core or penumbra on CT perfusion. Electronically Signed   By:  AliMerilyn BabaD.   On: 03/20/2022 02:43   CT HEAD CODE STROKE WO CONTRAST  Result Date: 03/20/2022 CLINICAL DATA:  Code stroke. EXAM: CT HEAD WITHOUT CONTRAST TECHNIQUE: Contiguous axial images were obtained from the base of the skull through the vertex without intravenous contrast. RADIATION DOSE REDUCTION: This exam was performed according to the departmental dose-optimization program which includes automated exposure control, adjustment of the mA and/or kV according to patient size and/or use of iterative reconstruction technique. COMPARISON:  03/12/2022 FINDINGS: Brain: No evidence of acute infarction, hemorrhage, mass, mass effect, or midline shift. No hydrocephalus or extra-axial collection. Vascular: No hyperdense vessel. Skull: Negative for fracture or focal lesion. Sinuses/Orbits: No acute finding. Other: The mastoid air cells are well aerated. ASPECTS ([facility] Program Early CT Score) - Ganglionic level infarction (caudate, lentiform nuclei, internal capsule, insula, M1-M3 cortex): 7 - Supraganglionic infarction (M4-M6 cortex): 3 Total score (0-10 with 10 being normal): 10 IMPRESSION: No evidence of acute intracranial abnormality. ASPECTS is 10. Imaging results were communicated on 03/20/2022 at 2:19 am to provider Dr. KhaLorrin Goodella secure text paging. Electronically Signed   By: AliMerilyn BabaD.   On: 03/20/2022 02:19   DG CHEST PORT 1 VIEW  Result Date: 03/20/2022 CLINICAL DATA:  Check gastric catheter placement EXAM: PORTABLE CHEST 1 VIEW COMPARISON:  Film from earlier in the same day. FINDINGS: Endotracheal tube is noted in satisfactory position. Gastric catheter extends into the stomach. Lungs are well aerated bilaterally. Mild left basilar atelectasis and small effusion is noted. No other focal abnormality is seen. IMPRESSION: Tubes and lines in satisfactory position. Mild left basilar atelectasis. Electronically Signed   By: MarInez CatalinaD.   On: 03/20/2022 00:12   DG Chest  Portable 1 View  Result Date: 03/27/2022 CLINICAL DATA:  OG tube placement EXAM: PORTABLE CHEST 1 VIEW COMPARISON:  03/31/2022, 8:44 p.m. FINDINGS: Similar position of endotracheal tube, positioned over the midtrachea. Similar position of esophagogastric tube, malpositioned, tip over the trachea. Unchanged elevation of the left hemidiaphragm. Heart and mediastinum unremarkable. Osseous structures unremarkable. IMPRESSION: 1. Similar position of esophagogastric tube, malpositioned, tip over the trachea. Recommend repositioning to ensure positioned within the esophagus. 2. Similar position of endotracheal tube, positioned over the midtrachea. These results will be called to the ordering clinician or representative by the Radiologist Assistant, and communication documented in the PACS or [employer] Corporationlectronically Signed   By: AleDelanna AhmadiD.   On: 03/20/2022 21:33   DG Chest Portable 1 View  Result Date: 03/18/2022 CLINICAL DATA:  Post intubation. EXAM: PORTABLE CHEST 1 VIEW COMPARISON:  03/12/2022. FINDINGS: Heart is enlarged and the mediastinal contour is within normal limits. No consolidation, effusion, or pneumothorax. A  soft tissue density is present over the left lung field, likely reflecting breast tissue. The endotracheal tube terminates 5.1 cm above the carina. An enteric tube terminates in the mid esophagus and should be advanced approximately 26 cm. IMPRESSION: 1. No active disease. 2. An enteric tube terminates in the proximal to mid esophagus and should be advanced 26 cm. 3. Endotracheal tube terminates 5.1 cm above the carina. Electronically Signed   By: Brett Fairy M.D.   On: 03/28/2022 20:57   DG Chest Portable 1 View  Result Date: 03/30/2022 CLINICAL DATA:  Cough weakness EXAM: PORTABLE CHEST 1 VIEW COMPARISON:  None Available. FINDINGS: Hazy opacity at the left base. Right lung is clear. Normal cardiac size. No pneumothorax IMPRESSION: Hazy opacity at the left base may reflect  atelectasis or mild pneumonia. Electronically Signed   By: Donavan Foil M.D.   On: 03/27/2022 18:56   CT HEAD WO CONTRAST  Result Date: 03/15/2022 CLINICAL DATA:  Weakness headache unable to move arms and legs EXAM: CT HEAD WITHOUT CONTRAST TECHNIQUE: Contiguous axial images were obtained from the base of the skull through the vertex without intravenous contrast. RADIATION DOSE REDUCTION: This exam was performed according to the departmental dose-optimization program which includes automated exposure control, adjustment of the mA and/or kV according to patient size and/or use of iterative reconstruction technique. COMPARISON:  None Available. FINDINGS: Brain: No evidence of acute infarction, hemorrhage, hydrocephalus, extra-axial collection or mass lesion/mass effect. Vascular: No hyperdense vessel or unexpected calcification. Skull: Normal. Negative for fracture or focal lesion. Sinuses/Orbits: No acute finding. Other: None IMPRESSION: Negative non contrasted CT appearance of the brain. Electronically Signed   By: Donavan Foil M.D.   On: 03/24/2022 18:55     PHYSICAL EXAM  Temp:  [96.3 F (35.7 C)-98.2 F (36.8 C)] 96.8 F (36 C) (02/20 1600) Pulse Rate:  [52-92] 92 (02/20 1600) Resp:  [21-26] 26 (02/20 1600) BP: (75-132)/(11-91) 110/61 (02/20 1600) SpO2:  [88 %-100 %] 90 % (02/20 1600) FiO2 (%):  [60 %-100 %] 60 % (02/20 1546)  General - Well nourished, well developed, in no apparent distress, s/p trach.  Ophthalmologic - fundi not visualized due to noncooperation.  Cardiovascular - Regular rhythm and rate.  Neuro - drowsy sleepy but easily arousable with voice, s/p trach still on vent but able to mouth answers, fully orientated, no aphasia, following all simple commands. Able to name and repeat with mouthing words. No gaze palsy, tracking bilaterally, visual field full, PERRL.  Facial symmetrical.  Tongue protrusion midline.  No neck movement, not able to shrug shoulders.  Bilateral UEs  and LEs 0/5, no movement, flaccid without tone. Sensation symmetrical bilaterally subjectively, preserved positional sensation, light touch, b/l DTR diminished, no Babinski, gait not tested.     ASSESSMENT/PLAN Ms. Emmersyn Anctil is a 22 y.o. female with history of anxiety, depression, smoker with birth control depot, alcohol abuse, THC user admitted for acute arm and leg weakness, respiratory failure, nausea vomiting difficulty speaking but able to mouth words. No tPA given due to outside window.    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  MRI C-spine signal change at anterior aspect of the spinal cord  from the craniocervical junction to the level of C7, concerning for acute spinal cord ischemia in an anterior spinal artery pattern. 2D Echo  EF 55-60% LE venous doppler no DVT TEE unremarkable, no PFO LDL 111 HgbA1c 5.3 Hypercoagulable work up largely negative, only beta2-glycoprotein IgM slightly high at 42 (will need repeat in 3 months) Autoimmune work up neg (ANA, ANCA, RF) ESR/CRP/RPR/HIV neg B12 = 193, relatively low Heparin subq for VTE prophylaxis No antithrombotic prior to admission, now on aspirin 81 mg daily. consider DAPT after PEG  Ongoing aggressive stroke risk factor management Therapy recommendations:  pending Disposition:  pending  Respiratory failure Due to high cervical infarct Was intubated, s/p trach 2/19 On vent CCM managing  BP management Stable  Avoid low BP Long term BP goal normotensive  Hyperlipidemia Home meds:  none  LDL 111, goal < 70 Now on crestor 20 (although pt child bearing age,  but will not in pregnancy for some time. Will provide education of statin in childbearing age women) Continue statin at discharge  Nexplanon implant Daily smoker Pt had Nexplanon implanted 2 years ago Daily smoker, 1PPD Smoking cessation counseling provided Negative DVT Discussed with Dr. Hollice Gong and he felt Nexplanon has low risk for thromboembolic event, he will need further discussion with pt before removing   Alcohol abuse Daily drinker, at least half pint Will educate on alcohol limitation CIWA protocol FA/B1/MVI  B12 deficiency B12 = 193, relatively low end of normal B12 supplement  Other Stroke Risk Factors Daily THC user, will education on cessation   Other Active Problems Back pain due to scoliosis, on Percocet Xanax as needed for anxiety/depression Dysphagia - on TF, PEG in am  Hospital day # 4  Discussed with Dr. Carlis Abbott CCM  Rosalin Hawking, MD PhD Stroke Neurology 03/23/2022 6:09 PM    To contact Stroke Continuity provider, please refer to [email or web address] After hours, contact General Neurology

## 2022-03-23 NOTE — Progress Notes (Signed)
[facility] ADULT ICU REPLACEMENT PROTOCOL   The patient does apply for the [facility] Adult ICU Electrolyte Replacment Protocol based on the criteria listed below:   1.Exclusion criteria: TCTS, ECMO, Dialysis, and Myasthenia Gravis patients 2. Is GFR >/= 30 ml/min? Yes.    Patient's GFR today is >60 3. Is SCr </= 2? Yes.   Patient's SCr is 0.63 mg/dL 4. Did SCr increase >/= 0.5 in 24 hours? No. 5.Pt's weight >40kg  Yes.   6. Abnormal electrolyte(s): Mag 1.9, K+ 3.2  7. Electrolytes replaced per protocol 8.  Call MD STAT for K+ </= 2.5, Phos </= 1, or Mag </= 1 Physician:  Zollie Scale [facility] 03/23/2022 1:21 AM

## 2022-03-23 NOTE — Progress Notes (Signed)
Nutrition Follow-up  DOCUMENTATION CODES:   Not applicable  INTERVENTION:   Discussed current IVF and hydration with Dr. Carlis Abbott. Plan to d/c NS and resume TF with added free water flushes  Tube Feeding via Cortrak:  Change to Vital AF 1.2 at 65 ml/hr Free water flush of 200 mL q 6  hours TF regimen provides 1872 kcals, 117 g of protein and 2064 mL of free water   NUTRITION DIAGNOSIS:   Inadequate oral intake related to inability to eat as evidenced by NPO status.  Being addressed via TF   GOAL:   Patient will meet greater than or equal to 90% of their needs  Met via TF   MONITOR:   Vent status, TF tolerance, I & O's, Labs  REASON FOR ASSESSMENT:   Consult Enteral/tube feeding initiation and management  ASSESSMENT:   22 yo female presents to ED on 2/16 with sudden onset SOB and sudden upper and lower extremity weakness and admitted with acute flaccid paralysis secondary to anterior spinal cord infarction and acute respiratory failure due to neuromuscular weakness requiring intubation.   PMH includes chronic prescription opioid and benzo use, depression with SI, HLD, kidney abscess. Pt is daily smoker; EtOH abuse, daily drinker-at least half pint  2/16 Admitted 2/19 Trach placed, Cortrak placed (distal stomach vs proximal duodenum)  Pt remains on vent support via trach TEE today at bedside, TF on hold for TEE SLP has been consulted for inline PMV  Pt alert on visit today, mouthing to RD that she is ok but thirsty, wanting water. Pt receiving frequent mouth care by nursing staff. Pt also indicating she is hungry, TF is on hold for procedure, hopefully resuming TF will help with this  Pt has been tolerating Vital 1.5 at rate of 50 ml/hr  Still has quadriplegia  Weight today: 101.2 kg, up ~25 kg higher than yesterdays weight of 75 kg. RD requested new weight today  Currently on NS at 100 ml/hr,  no additional free water flushes.   Noted Vitamin B12 193 (low normal)  and supplementation initiated by MD. CRP wdl. Folic acid wdl  Monitor stool output and consistency; plan to add Banatrol TF if having diarrhea. 320 mL stool in 24 hours, no output yet today  Labs: reviewed Meds: MVI, folic acid, 123456, thiamine  NUTRITION - FOCUSED PHYSICAL EXAM:  Flowsheet Row Most Recent Value  Upper Arm Region No depletion  Thoracic and Lumbar Region No depletion  Buccal Region No depletion  Temple Region No depletion  Clavicle Bone Region No depletion  Clavicle and Acromion Bone Region No depletion  Scapular Bone Region No depletion  Dorsal Hand No depletion  Patellar Region No depletion  Anterior Thigh Region No depletion  Posterior Calf Region No depletion  Edema (RD Assessment) Mild       Diet Order:   Diet Order             Diet NPO time specified Except for: Sips with Meds  Diet effective midnight                   EDUCATION NEEDS:   Not appropriate for education at this time  Skin:  Skin Assessment: Reviewed RN Assessment  Last BM:  2/19  Height:   Ht Readings from Last 1 Encounters:  03/22/22 5' 7"$  (1.702 m)    Weight:   Wt Readings from Last 1 Encounters:  03/22/22 101.2 kg    Ideal Body Weight:  61.4 kg  BMI:  Body mass index is 34.94 kg/m.  Estimated Nutritional Needs:   Kcal:  1850-2050  Protein:  95-115 grams  Fluid:  >/= 1.8 L   Rachael Passey MS, RDN, LDN, CNSC Registered Dietitian 3 Clinical Nutrition RD Pager and On-Call Pager Number Located in [place]

## 2022-03-23 NOTE — TOC Initial Note (Addendum)
Transition of Care [facility]) - Initial/Assessment Note    Patient Details  Name: Rachael [name] MRN: [id number] Date of Birth: 2000/08/12  Transition of [facility]) CM/SW Contact:    Erenest Rasher, RN Phone Number: [phone] 03/23/2022, 12:34 PM  Clinical Narrative:                  Spoke to mother at bedside. States she has submitted to her job for [employer]. Explained she will need to print off paperwork and will have provider complete. Pt does not work currently. She was independent PTA. Pt does not qualify for LTAC due to she has Medicaid which does not have a LTAC benefit. Will continue to follow for dc needs. Provided mother with CM contact number.      Expected Discharge Plan: IP Rehab Facility Barriers to Discharge: Continued Medical Work up   Patient Goals and CMS Choice Patient states their goals for this hospitalization and ongoing recovery are:: wants dtr to recover CMS Medicare.gov Compare Post Acute Care list provided to:: Patient Represenative (must comment) (Mother- IT consultant)        Expected Discharge Plan and Services   Discharge Planning Services: CM Consult   Living arrangements for the past 2 months: Single Family Home                    Prior Living Arrangements/Services Living arrangements for the past 2 months: Single Family Home   Patient language and need for interpreter reviewed:: Yes Do you feel safe going back to the place where you live?: Yes            Criminal Activity/Legal Involvement Pertinent to Current Situation/Hospitalization: No - Comment as needed  Activities of Daily Living      Permission Sought/Granted Permission sought to share information with : Case Manager, Family Supports, PCP Permission granted to share information with : Yes, Verbal Permission Granted  Share Information with NAME: Weston Settle     Permission granted to share info w Relationship: mother  Permission granted to share info w Contact Information:  [phone]  Emotional Assessment   Attitude/Demeanor/Rapport: Intubated (Following Commands or Not Following Commands)       Psych Involvement: No (comment)  Admission diagnosis:  Respiratory acidosis [E87.29] Altered mental state [R41.82] Hypothermia, initial encounter [T68.XXXA] Community acquired pneumonia, unspecified laterality [J18.9] Overdose of undetermined intent, initial encounter [T50.904A] Patient Active Problem List   Diagnosis Date Noted   Chronic respiratory failure with hypoxia and hypercapnia ([place]) 03/22/2022   Paralysis ([place]) 03/20/2022   Altered mental state 03/05/2022   Vaginal delivery 11/16/2019   IUGR (intrauterine growth restriction) affecting care of mother, third trimester, fetus 1 11/15/2019   Pregnancy affected by fetal growth restriction 10/08/2019   Supervision of normal pregnancy, antepartum 05/15/2019   High risk sexual behavior in adolescent    Pyelonephritis 06/21/2017   Suicidal ideation 04/02/2016   MDD (major depressive disorder) 04/01/2016   PCP:  Patient, No Pcp Per Pharmacy:   [facility], [address] [place]. [place] [address] Phone: [phone] Fax: [phone]     Social Determinants of Health (SDOH) Social History: [place]: No Food Insecurity (03/23/2022)  Housing: Low Risk  (03/23/2022)  Transportation Needs: No Transportation Needs (03/23/2022)  Utilities: Not At Risk (03/23/2022)  Tobacco Use: Medium Risk (11/18/2021)   SDOH Interventions: Food Insecurity Interventions: Intervention Not Indicated Transportation Interventions: Intervention Not Indicated Utilities Interventions:  Intervention Not Indicated   Readmission Risk Interventions     No data to display

## 2022-03-23 NOTE — Progress Notes (Signed)
PT Cancellation Note  Patient Details Name: Rachael [name] MRN: [id number] DOB: 23-Aug-2000   Cancelled Treatment:    Reason Eval/Treat Not Completed: Patient at procedure or test/unavailable (echo)   Jerimah Witucki B Arletha Marschke 03/23/2022, 11:42 AM [place] Office: [phone]

## 2022-03-23 NOTE — Progress Notes (Signed)
Occupational Therapy Treatment Patient Details Name: Rachael [name] MRN: [id number] DOB: 2000/07/01 Today's Date: 03/23/2022   History of present illness Rachael [name] is a 22 y.o. female admitted 03/15/2022 with acute arms and leg weakness, dyspnea and hypercabia.  (+) for polysubstances. Pt was intubated on 2/16.   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  Patient able to be moved through PROM at each joint without problems.  No shoulder sublux noted, slight inversion of right foot noted, unsure if that is new.  Swelling continues to both hands, and positioned to reduce edema.  OT will continue to follow in the acute setting for any changes to AROM noted, and to assist with eventual transition to next level of care indicated.     Recommendations for follow up therapy are one component of a multi-disciplinary discharge planning process, led by the attending physician.  Recommendations may be updated based on patient status, additional functional criteria and insurance authorization.    Follow Up Recommendations  OT at Long-term acute care hospital     Assistance Recommended at Discharge Frequent or constant Supervision/Assistance  Patient can return home with the following  Two people to help with walking and/or transfers;Two people to help with bathing/dressing/bathroom;Assistance with cooking/housework;Assistance with feeding;Direct supervision/assist for medications management;Direct supervision/assist for financial management;Assist for transportation;Help with stairs or ramp for entrance   Equipment Recommendations       Recommendations for Other Services      Precautions / Restrictions Precautions Precaution Comments: Trach Restrictions Weight Bearing Restrictions:  No       Mobility Bed Mobility Overal bed mobility: Needs Assistance             General bed mobility comments: total A +2 for all aspects of bed mobility at this time. RN team has been doing excellent job of rotating/rolling for pressure relief    Transfers                         Balance                                           ADL either performed or assessed with clinical judgement   ADL Overall ADL's : Needs assistance/impaired                                       General ADL Comments: total A for all aspects of ADL at this time    Extremity/Trunk Assessment Upper Extremity Assessment RUE Deficits / Details: flaccid, edematous, full PROM RUE Sensation: WNL RUE Coordination: decreased fine motor;decreased gross motor LUE Deficits / Details: flaccid, edematous, full PROM LUE Sensation: WNL LUE Coordination: decreased fine motor;decreased gross motor   Lower Extremity Assessment Lower Extremity Assessment: Defer to PT evaluation                          Cognition Arousal/Alertness: Lethargic Behavior During Therapy: Flat affect Overall Cognitive Status: Within Functional Limits for tasks assessed  General Comments: answering questions from OT and MD in the room.  following commands.        Exercises Low Level/ICU Exercises Ankle Circles/Pumps: PROM, Right, Left, Both, 10 reps Hip ABduction/ADduction: PROM, Right, Left, Both, 10 reps Heel Slides: PROM, Right, Left, Both, 10 reps Shoulder Flexion: PROM, Right, Left, 10 reps, Supine Elbow Flexion: PROM, Right, Left, 10 reps, Supine Other Exercises Other Exercises: hip flexion PROM both x10 Other Exercises: shoulder abduction BUE x10 PROM Other Exercises: shoulder IR and ER BUE x 10    Shoulder Instructions       General Comments      Pertinent Vitals/ Pain       Pain Assessment Pain Assessment:  Faces Faces Pain Scale: No hurt Pain Intervention(s): Monitored during session                                                          Frequency  Min 2X/week        Progress Toward Goals  OT Goals(current goals can now be found in the care plan section)     Acute Rehab OT Goals OT Goal Formulation: With patient Time For Goal Achievement: 04/03/22 Potential to Achieve Goals: Winner Discharge plan remains appropriate    Co-evaluation                 AM-PAC OT "6 Clicks" Daily Activity     Outcome Measure   Help from another person eating meals?: Total Help from another person taking care of personal grooming?: Total Help from another person toileting, which includes using toliet, bedpan, or urinal?: Total Help from another person bathing (including washing, rinsing, drying)?: Total Help from another person to put on and taking off regular upper body clothing?: Total Help from another person to put on and taking off regular lower body clothing?: Total 6 Click Score: 6    End of Session    OT Visit Diagnosis: Other abnormalities of gait and mobility (R26.89);Muscle weakness (generalized) (M62.81);Other symptoms and signs involving the nervous system (R29.898)   Activity Tolerance Patient tolerated treatment well   Patient Left in bed;with call bell/phone within reach;with family/visitor present   Nurse Communication Precautions        Time: [id number] OT Time Calculation (min): 18 min  Charges: OT General Charges $OT Visit: 1 Visit OT Treatments $Therapeutic Exercise: 8-22 mins  03/23/2022  RP, OTR/L  Acute Rehabilitation Services  Office:  [phone]   Metta Clines 03/23/2022, 11:48 AM

## 2022-03-23 NOTE — Progress Notes (Signed)
Transported patient to C.T while patient was on the vent. Patient remained stable during the transport.

## 2022-03-23 NOTE — CV Procedure (Signed)
     Transesophageal Echocardiogram Note  Keyonna Mccallister [id number] [birth year]  Procedure: Transesophageal Echocardiogram Indications: Stroke  Procedure Details Consent: Obtained Time Out: Verified patient identification, verified procedure, site/side was marked, verified correct patient position, special equipment/implants available, Radiology Safety Procedures followed,  medications/allergies/relevent history reviewed, required imaging and test results available.  Performed  Medications: Versed: 34m Fentanyl: 749m  Left Ventrical:  LVEF 55-60%  Mitral Valve: Normal structure, trivial MR  Aortic Valve: Tricuspid, trivial AI  Tricuspid Valve: Normal structure, trivial TR  Pulmonic Valve: Normal structure, trivial PR  Left Atrium/ Left atrial appendage: Normal LA size. No evidence of LAA thrombus  Atrial septum: No PFO by color doppler or by multiple agitated saline contrast studies  Aorta: No significant plaque   Complications: No apparent complications Patient did tolerate procedure well.  HeFreada BergeronMD 03/23/2022, 12:43 PM

## 2022-03-23 NOTE — Progress Notes (Signed)
SLP Cancellation Note  Patient Details Name: Rachael [name] MRN: [id number] DOB: 12/15/2000   Cancelled treatment:        Had planned to do inline Passy-Muir speaking valve with pt however she is currently still sedated from TEE and hour earlier. Will plan to continue efforts tomorrow.    Houston Siren 03/23/2022, 1:43 PM

## 2022-03-24 ENCOUNTER — Encounter (HOSPITAL_COMMUNITY): Payer: Self-pay | Admitting: Pulmonary Disease

## 2022-03-24 ENCOUNTER — Inpatient Hospital Stay (HOSPITAL_COMMUNITY): Payer: Medicaid Other

## 2022-03-24 DIAGNOSIS — Z515 Encounter for palliative care: Secondary | ICD-10-CM | POA: Diagnosis not present

## 2022-03-24 DIAGNOSIS — J189 Pneumonia, unspecified organism: Secondary | ICD-10-CM | POA: Diagnosis not present

## 2022-03-24 DIAGNOSIS — Z66 Do not resuscitate: Secondary | ICD-10-CM | POA: Diagnosis not present

## 2022-03-24 DIAGNOSIS — I6389 Other cerebral infarction: Secondary | ICD-10-CM

## 2022-03-24 DIAGNOSIS — G8250 Quadriplegia, unspecified: Secondary | ICD-10-CM | POA: Diagnosis not present

## 2022-03-24 DIAGNOSIS — E8729 Other acidosis: Secondary | ICD-10-CM | POA: Diagnosis not present

## 2022-03-24 DIAGNOSIS — J9601 Acute respiratory failure with hypoxia: Secondary | ICD-10-CM | POA: Diagnosis not present

## 2022-03-24 DIAGNOSIS — J9602 Acute respiratory failure with hypercapnia: Secondary | ICD-10-CM | POA: Diagnosis not present

## 2022-03-24 DIAGNOSIS — Z7189 Other specified counseling: Secondary | ICD-10-CM | POA: Diagnosis not present

## 2022-03-24 DIAGNOSIS — Z9911 Dependence on respirator [ventilator] status: Secondary | ICD-10-CM | POA: Diagnosis not present

## 2022-03-24 LAB — CULTURE, BAL-QUANTITATIVE W GRAM STAIN: Culture: 30000 — AB

## 2022-03-24 LAB — GLUCOSE, CAPILLARY
Glucose-Capillary: 81 mg/dL (ref 70–99)
Glucose-Capillary: 96 mg/dL (ref 70–99)
Glucose-Capillary: 89 mg/dL (ref 70–99)
Glucose-Capillary: 98 mg/dL (ref 70–99)

## 2022-03-24 LAB — CULTURE, BLOOD (ROUTINE X 2)
Culture: NO GROWTH
Culture: NO GROWTH
Special Requests: ADEQUATE

## 2022-03-24 LAB — BASIC METABOLIC PANEL
Anion gap: 9 (ref 5–15)
BUN: 9 mg/dL (ref 6–20)
CO2: 24 mmol/L (ref 22–32)
Calcium: 8.1 mg/dL — ABNORMAL LOW (ref 8.9–10.3)
Chloride: 106 mmol/L (ref 98–111)
Creatinine, Ser: 0.59 mg/dL (ref 0.44–1.00)
GFR, Estimated: >60 mL/min (ref >60)
Glucose, Bld: 96 mg/dL (ref 70–99)
Potassium: 3.5 mmol/L (ref 3.5–5.1)
Sodium: 139 mmol/L (ref 135–145)

## 2022-03-24 LAB — LUPUS ANTICOAGULANT PANEL
DRVVT: 36.1 s (ref 0.0–47.0)
PTT Lupus Anticoagulant: 38.9 s (ref 0.0–43.5)

## 2022-03-24 LAB — MAGNESIUM: Magnesium: 2.2 mg/dL (ref 1.7–2.4)

## 2022-03-24 LAB — PROTEIN S ACTIVITY: Protein S Activity: 80 % (ref 63–140)

## 2022-03-24 LAB — PROTEIN C ACTIVITY: Protein C Activity: 106 % (ref 73–180)

## 2022-03-24 LAB — PHOSPHORUS: Phosphorus: 2.7 mg/dL (ref 2.5–4.6)

## 2022-03-24 LAB — PROTEIN S, TOTAL: Protein S Ag, Total: 64 % (ref 60–150)

## 2022-03-24 MED ORDER — POTASSIUM CHLORIDE 10 MEQ/50ML IV SOLN
10.0000 meq | INTRAVENOUS | Status: AC
  Administered 2022-03-24 (×4): 10 meq via INTRAVENOUS
  Filled 2022-03-24 (×4): qty 50

## 2022-03-24 MED ORDER — SODIUM CHLORIDE 0.9 % IV SOLN
3.0000 g | Freq: Three times a day (TID) | INTRAVENOUS | Status: DC
  Administered 2022-03-24 – 2022-03-26 (×6): 3 g via INTRAVENOUS
  Filled 2022-03-24 (×6): qty 8

## 2022-03-24 MED ORDER — ACETAMINOPHEN 325 MG PO TABS
650.0000 mg | ORAL_TABLET | Freq: Four times a day (QID) | ORAL | Status: DC | PRN
  Administered 2022-03-25 – 2022-03-26 (×5): 650 mg via ORAL
  Filled 2022-03-24 (×5): qty 2

## 2022-03-24 MED ORDER — FUROSEMIDE 10 MG/ML IJ SOLN
40.0000 mg | Freq: Once | INTRAMUSCULAR | Status: AC
  Administered 2022-03-24: 40 mg via INTRAVENOUS
  Filled 2022-03-24: qty 4

## 2022-03-24 MED FILL — Ketamine HCl Inj 10 MG/ML: INTRAMUSCULAR | Qty: 18 | Status: AC

## 2022-03-24 NOTE — Progress Notes (Signed)
Cortrak Tube Team Note:  Consult received to reposition a Cortrak feeding tube.  RN notified Cortrak team that tube was no longer flushing after TEE on 2/20. Tube repositioned and remains in L nare now at 64 cm.  X-ray is required, abdominal x-ray has been ordered by the Cortrak team. Please confirm tube placement before using the Cortrak tube.   If the tube becomes dislodged please keep the tube and contact the Cortrak team at [email or web address] for replacement.  If after hours and replacement cannot be delayed, place a NG tube and confirm placement with an abdominal x-ray.    Gustavus Bryant, MS, RD, LDN Inpatient Clinical Dietitian Please see AMiON for contact information.

## 2022-03-24 NOTE — Progress Notes (Signed)
Received a call from day shift RN about a medication error. Clonazepam scheduled for 1600 was charted as given but was not given. Medication was removed from the room and returned to the pyxis and charting was changed.

## 2022-03-24 NOTE — Progress Notes (Signed)
eLink Physician-Brief Progress Note Patient Name: Rachael [name] DOB: 29-Jan-2001 MRN: [id number]   Date of Service  03/24/2022  HPI/Events of Note  Patient with feeling of impending doom, no physical exam or vital signs correlates, patient's Klonopin was delayed but has just been given.  eICU Interventions  PRN iv Versed while waiting for Klonopin to fully kick in.        Frederik Pear 03/24/2022, 9:40 PM

## 2022-03-24 NOTE — Progress Notes (Signed)
[facility] ADULT ICU REPLACEMENT PROTOCOL   The patient does apply for the [facility] Adult ICU Electrolyte Replacment Protocol based on the criteria listed below:   1.Exclusion criteria: TCTS, ECMO, Dialysis, and Myasthenia Gravis patients 2. Is GFR >/= 30 ml/min? Yes.    Patient's GFR today is >60 3. Is SCr </= 2? Yes.   Patient's SCr is 0.59 mg/dL 4. Did SCr increase >/= 0.5 in 24 hours? No. 5.Pt's weight >40kg  Yes.   6. Abnormal electrolyte(s): K+ 3.5  7. Electrolytes replaced per protocol 8.  Call MD STAT for K+ </= 2.5, Phos </= 1, or Mag </= 1 Physician:  Eula Fried [facility] 03/24/2022 6:09 AM

## 2022-03-24 NOTE — Evaluation (Signed)
Passy-Muir Speaking Valve - Evaluation Patient Details  Name: Rachael [name] MRN: [id number] Date of Birth: 29-Dec-2000  Today's Date: 03/24/2022 Time: [id number] SLP Time Calculation (min) (ACUTE ONLY): 14 min  Past Medical History:  Past Medical History:  Diagnosis Date   Depression    few yrs ago [facility]), doing good now   Hyperlipidemia    Kidney abscess    Past Surgical History:  Past Surgical History:  Procedure Laterality Date   NO PAST SURGERIES     HPI:  Rachael [name] is a 22 y.o. female with PMH significant for depression, HLD, Kidney abscess who presents with acute arms and leg weakness, dyspnea and hypercabia.  Pt was intubated on 2/16.   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    Assessment / Plan / Recommendation  Clinical Impression  Pt was alert and cooperative during PMV placed in line with ventilator on PRVC. RT slowly deflated cuff, deep suctioned and therapist placed valve. Rachael [name] able to expectorate secretions orally and immediately verbalized in mildly hoarse qualtiy. Breath support was decreased and she needed verbal cues to coordinate breath with phonation. Intensity was adequate during most of assessment but towards the end she complained of some discomfort, dry throat and difficulty "breathing and talking." She made her needs known requesting "something to eat and drink" (getting PEG today). RR 23-26, Sp02 97-99% and HR 81-90. She requested valve removed but was agreeable to few more minutes of practice coordinating voice and respiration. Recommend she wear valve with ST in coordination with RT for vent settings. Uncertain if/when she will be able to tolerate trach collar. ST to continue. SLP Visit Diagnosis: Aphonia (R49.1)    SLP Assessment  Patient needs continued Speech [facility]    Recommendations for follow up therapy are one component of a  multi-disciplinary discharge planning process, led by the attending physician.  Recommendations may be updated based on patient status, additional functional criteria and insurance authorization.  Follow Up Recommendations   (TBD)    Assistance Recommended at Discharge Frequent or constant Supervision/Assistance  Functional Status Assessment Patient has had a recent decline in their functional status and demonstrates the ability to make significant improvements in function in a reasonable and predictable amount of time.  Frequency and Duration min 2x/week  2 weeks    PMSV Trial PMSV was placed for: 12 min Able to redirect subglottic air through upper airway: Yes Voice Quality: Hoarse Able to Expectorate Secretions: Yes Level of Secretion Expectoration with PMSV: Oral Breath Support for Phonation: Mildly decreased Intelligibility: Intelligibility reduced Word: 75-100% accurate Phrase: 75-100% accurate Sentence: 75-100% accurate Respirations During Trial:  (23-26) SpO2 During Trial:  (97-99%) Pulse During Trial:  (81-90) Behavior: Alert;Controlled;Cooperative;Expresses self well;Responsive to questions   Tracheostomy Tube       Vent Dependency  Vent Mode: PRVC Set Rate: 26 bmp FiO2 (%): 40 % Vt Set: 490 mL    Cuff Deflation Trial Tolerated Cuff Deflation: Yes Length of Time for Cuff Deflation Trial:  (14 min) Behavior: Alert;Cooperative;Expresses self well;Responsive to questions (needs some encouragement)         Houston Siren 03/24/2022, 9:08 AM

## 2022-03-24 NOTE — Progress Notes (Addendum)
Daily Progress Note   Patient Name: Rachael [name]       Date: 03/24/2022 DOB: [birth year]  Age: 22 y.o. MRN#: [id number] Attending Physician: Julian Hy, DO Primary Care Physician: Patient, No Pcp Per Admit Date: 03/07/2022 Length of Stay: 5 days  Reason for Consultation/Follow-up: Establishing goals of care  HPI/Patient Profile:  22 y.o. female  with past medical history of depression and suicidal ideation who presented to [place] ED with sudden onset of shortness of breath and upper and lower extremity weakness. ED work-up found loss of speaking ability and hypercarbic respiratory failure and was subsequently intubated and transferred to [facility].  She was admitted on 03/31/2022 with acute flaccid paralysis due to anterior spinal cord infarction, acute hypercapnic and hypoxic respiratory failure due to neuromuscular weakness, aspiration pneumonia, hypothermia, shock likely neurogenic and others.    PMT was consulted for Anna conversations.  Subjective:   Subjective: Chart Reviewed. Updates received. Patient Assessed. Created space and opportunity for patient  and family to explore thoughts and feelings regarding current medical situation.  Today's Discussion: Today saw the patient at the bedside.  Her mother was also present.  Nurse was present as well.  The patient is unable to speak secondary to trach placement a couple days ago.  She does try to mouth words and is generally understandable.  She is awake and alert.  She understands what is going on with her.  We reviewed her current situation including anterior cervical spine stroke subsequent respiratory failure and quadriplegia.    Mom shares that she is a very active person, does not have a lot of patients and when she sets her mind to something wants it done soon.  I expressed my understanding of her frustration.  She asked if there is anything we can do to fix this.  I shared that unfortunately there is no pillar treatment that  can fix this.  However, there is a possibility that there could be some rewiring to allow some improvement.  How much improvement, if any, is unknown at this time.  I explained that maximal improvement usually occurs within the first 3 months, although some improvement can be obtained throughout 1 year.  I expressed my understanding that she may not want to wait in this state for 1 year or even possibly 3 months.  I shared that we could approach to take at this point, very early on in her care, is to continue to aggressively try to improve her situation.  We can allow a certain amount of time, say 2 weeks, and at that point we circled the wagons and have another discussion like this to determine are we having improvement, if she comfortable continuing on trying to get further improvement, etc.  If so, we can allow another brief period of time before another check in.  I did emphasize that any point she decides that she does not want to keep doing this that is okay and we would support her decision.  She seemed to take comfort in the idea that we are focusing on her wishes.  I also discussed that we are making attempts at inline Passy-Muir valve to allow her to vocalize and participate conversations.  At this time it is too early to attempt oral intake and swallow study.  However, there is the possibility that coming future for this.  She expresses that she is very thirsty and would like to have something to drink, although we cannot provide this at this time.  We had a discussion on CODE STATUS.  She was very clear that she would want to be a DNR if she would become pulseless and breathless.  I told her I would change her CODE STATUS.  We also discussed that being in bed like this for a long period of time carries a lot of risk.  There is a chance that she could get sicker such as pneumonia, further blood clots, bedsores, infections, organ failure.  I discussed that if she becomes medically worse and this  would lessen her chances of improvement.  At that point we would need sooner discussions on how to proceed and she verbalized understanding with this idea.  I shared that palliative medicine will continue to follow along to support her and her family as they are walking extremity.  I told her that tomorrow is my last day on service and I will check in tomorrow.  Otherwise, I will be back on Monday and check in then.  Palliative medicine is available over the weekend and the medical team and nursing team can reach out for support if needed over the weekend.  I provided emotional and general support through therapeutic listening, empathy, sharing of stories, therapeutic touch, and other techniques. I answered all questions and addressed all concerns to the best of my ability.  Review of Systems  Constitutional:        Thirsty  Gastrointestinal:  Negative for abdominal pain, nausea and vomiting.    Objective:   Vital Signs:  BP 117/70   Pulse 81   Temp 99.5 F (37.5 C)   Resp (!) 26   Ht 5' 7"$  (1.702 m)   Wt 101.3 kg   SpO2 96%   BMI 34.98 kg/m   Physical Exam: Physical Exam Vitals and nursing note reviewed.  Constitutional:      General: She is sleeping. She is not in acute distress. HENT:     Head: Normocephalic and atraumatic.  Cardiovascular:     Rate and Rhythm: Normal rate.  Pulmonary:     Effort: No respiratory distress.  Abdominal:     General: Abdomen is flat. There is no distension.     Palpations: Abdomen is soft.  Skin:    General: Skin is warm and dry.  Neurological:     Mental Status: She is oriented to person, place, and time and easily aroused.     Comments: Quadraplegia  Psychiatric:        Mood and Affect: Mood normal.        Behavior: Behavior normal.     Comments: Intermittently frustrated     Palliative Assessment/Data: 10% (on CorTrak feeding)    Existing Vynca/ACP Documentation: None  Assessment & Plan:   Impression: Present on  Admission:  Altered mental state  SUMMARY OF RECOMMENDATIONS   Changed to DNR Continue full scope of care otherwise Goal of attempting to get some form of improvement Allow time for outcomes Tentatively plan for another GOC discussion in about 2 weeks to determine whether to keep going PMT will continue to follow I will check back in tomorrow and then again on Monday Please contact PMT for any significant changes or palliative needs over the weekend  Symptom Management:  Per primary team PMT will continue to follow  Code Status: DNR  Prognosis: Unable to determine  Discharge Planning: To Be Determined  Discussed with: Patient, patient's family, medical team, nursing team  Thank you for allowing [place] to participate in the care of [facility]  [name] PMT will continue to support holistically.  Time Total: 75 min  Visit consisted of counseling and education dealing with the complex and emotionally intense issues of symptom management and palliative care in the setting of serious and potentially life-threatening illness. Greater than 50%  of this time was spent counseling and coordinating care related to the above assessment and plan.  Walden Field, NP Palliative Medicine Team  Team Phone # [phone] (Nights/Weekends)  09/30/2020, 8:17 AM

## 2022-03-24 NOTE — Progress Notes (Signed)
CPT held at this time. Patient working with PT.

## 2022-03-24 NOTE — Progress Notes (Signed)
No scheduled time for PEG placement, impeding ability to give by tube meds.  Coretrak tube is not working at this time due to being clogged and/or wrong position. Discussed with provider and dietician regarding this issue, Coretrak team to come and try to reposition tube and get working again while we wait for confirmation of PEG placement time. Appropriate orders placed.

## 2022-03-24 NOTE — Progress Notes (Signed)
STROKE TEAM PROGRESS NOTE   SUBJECTIVE (INTERVAL HISTORY) No family is at the bedside.  Pt lying in bed, awake alert, had cortrak replaced. Pending PEG tomorrow. 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     OBJECTIVE Temp:  [96.4 F (35.8 C)-99.5 F (37.5 C)] 99.1 F (37.3 C) (02/21 1512) Pulse Rate:  [60-86] 84 (02/21 1512) Cardiac Rhythm: Normal sinus rhythm;Sinus bradycardia (02/21 0800) Resp:  [25-26] 26 (02/21 1512) BP: (107-149)/(56-86) 117/70 (02/21 1116) SpO2:  [92 %-100 %] 96 % (02/21 1512) FiO2 (%):  [40 %-50 %] 40 % (02/21 1512) Weight:  [101.3 kg] 101.3 kg (02/21 0500)  Recent Labs  Lab 03/23/22 1947 03/23/22 2324 03/24/22 0315 03/24/22 0826 03/24/22 1113  GLUCAP 102* 113* 81 96 89   Recent Labs  Lab 03/21/22 0339 03/21/22 0359 03/21/22 2255 03/22/22 0154 03/22/22 0611 03/22/22 2350 03/23/22 0550 03/24/22 0447  NA 138   < > 140  --  139 140 142 139  K 4.4   < > 3.8  --  3.6 3.2* 3.7 3.5  CL 109  --   --   --  106 109 105 106  CO2 20*  --   --   --  25 26 25 24  $ GLUCOSE 103*  --   --   --  146* 135* 116* 96  BUN 7  --   --   --  9 7 8 9  $ CREATININE 0.80  --   --   --  0.62 0.63 0.68 0.59  CALCIUM 8.4*  --   --   --  8.6* 7.9* 8.6* 8.1*  MG 2.3  --   --  2.2  --  1.9 2.8* 2.2  PHOS 3.4  --   --  1.4*  --  2.6 2.7 2.7   < > = values in this interval not displayed.   Recent Labs  Lab 03/29/2022 1825  AST 14*  ALT 14  ALKPHOS 74  BILITOT 0.6  PROT 7.6  ALBUMIN 4.6   Recent Labs  Lab 03/09/2022 1825 03/13/2022 1919 03/20/22 1010 03/21/22 0127 03/21/22 1140 03/21/22 2255 03/22/22 0154 03/22/22 0611 03/23/22 0417  WBC 18.3*  --  13.5*  --   --   --  20.0* 20.3* 15.0*  NEUTROABS 15.0*  --   --   --   --   --   --   --   --   HGB 13.8   < > 11.1*   < > 10.5* 11.6* 12.0 11.2* 10.2*  HCT 41.5   < > 34.9*   < > 31.0* 34.0* 37.0 34.4* 31.2*  MCV 84.3  --  87.5  --   --   --  88.7 87.3 89.7  PLT 299  --  221  --   --   --  177 193 169   <  > = values in this interval not displayed.   No results for input(s): "CKTOTAL", "CKMB", "CKMBINDEX", "TROPONINI" in the last 168 hours. No results for input(s): "LABPROT", "INR" in the last 72 hours.  No results for input(s): "COLORURINE", "LABSPEC", "PHURINE", "GLUCOSEU", "HGBUR", "BILIRUBINUR", "KETONESUR", "PROTEINUR", "UROBILINOGEN", "NITRITE", "LEUKOCYTESUR" in the last 72 hours.  Invalid input(s): "APPERANCEUR"      Component Value Date/Time   CHOL 176 03/20/2022 1010   TRIG 138 03/20/2022 1010   TRIG 140 03/20/2022 1010   HDL 37 (L) 03/20/2022 1010   CHOLHDL 4.8 03/20/2022 1010   VLDL 28 03/20/2022 1010  LDLCALC 111 (H) 03/20/2022 1010   Lab Results  Component Value Date   HGBA1C 5.3 03/20/2022      Component Value Date/Time   LABOPIA POSITIVE (A) 03/11/2022 1825   COCAINSCRNUR NONE DETECTED 03/28/2022 1825   LABBENZ POSITIVE (A) 03/09/2022 1825   AMPHETMU NONE DETECTED 03/31/2022 1825   THCU POSITIVE (A) 03/08/2022 1825   LABBARB NONE DETECTED 03/04/2022 1825    Recent Labs  Lab 03/18/2022 1825  ETH <10    I have personally reviewed the radiological images below and agree with the radiology interpretations.  DG Abd Portable 1V  Result Date: 03/24/2022 CLINICAL DATA:  Feeding tube placement EXAM: PORTABLE ABDOMEN - 1 VIEW COMPARISON:  03/22/2022 FINDINGS: Feeding tube tip in the mid stomach. IMPRESSION: Feeding tube in the stomach. Electronically Signed   By: Rolm Baptise M.D.   On: 03/24/2022 15:10   CT ABDOMEN WO CONTRAST  Result Date: 03/23/2022 CLINICAL DATA:  Unintended weight loss EXAM: CT ABDOMEN WITHOUT CONTRAST TECHNIQUE: Multidetector CT imaging of the abdomen was performed following the standard protocol without IV contrast. RADIATION DOSE REDUCTION: This exam was performed according to the departmental dose-optimization program which includes automated exposure control, adjustment of the mA and/or kV according to patient size and/or use of iterative  reconstruction technique. COMPARISON:  03/22/2022, 06/21/2017 FINDINGS: Lower chest: Dense consolidation within the lower lobes, left greater than right, favor atelectasis. Trace bilateral pleural fluid. Hepatobiliary: Unremarkable unenhanced appearance of the liver and gallbladder. Pancreas: Unremarkable unenhanced appearance. Spleen: Unremarkable unenhanced appearance. Adrenals/Urinary Tract: No urinary tract calculi or obstructive uropathy within either kidney. The adrenals are unremarkable. Stomach/Bowel: No bowel obstruction or ileus. No bowel wall thickening or inflammatory change. Enteric catheter extends into the gastric lumen, tip within the gastric body. Vascular/Lymphatic: No significant vascular findings are present. No abdominal lymphadenopathy. Other: No free fluid or free intraperitoneal gas. No abdominal wall hernia. Musculoskeletal: No acute or destructive bony lesions. Reconstructed images demonstrate no additional findings. IMPRESSION: 1. Dense bibasilar consolidation, left greater than right, favor atelectasis. 2. Enteric catheter within the gastric lumen. 3. Otherwise unremarkable unenhanced CT of the abdomen. Electronically Signed   By: Randa Ngo M.D.   On: 03/23/2022 22:32   ECHO TEE  Result Date: 03/23/2022    TRANSESOPHOGEAL ECHO REPORT   Patient Name:   SEATTLE NUSBAUM Date of Exam: 03/23/2022 Medical Rec #:  [id number]        Height:       67.0 in Accession #:    [id number]       Weight:       223.1 lb Date of Birth:  [birth year]         BSA:          2.118 m Patient Age:    22 years         BP:           134/79 mmHg Patient Gender: F                HR:           65 bpm. Exam Location:  Inpatient Procedure: Transesophageal Echo, Color Doppler, Cardiac Doppler and Saline            Contrast Bubble Study Indications:    Stroke  History:        Patient has prior history of Echocardiogram examinations, most                 recent 03/20/2022. Risk Factors:Dyslipidemia. Opiates, benzos,  THC use; Nexplanon.  Sonographer:    Eartha Inch Referring Phys: [id number] Margie Billet PROCEDURE: After discussion of the risks and benefits of a TEE, an informed consent was obtained from the patient. The patient was recently extubated. The transesophogeal probe was passed without difficulty through the esophogus of the patient. Imaged were obtained with the patient in a supine position. Sedation performed by performing physician. Patients was under conscious sedation during this procedure. Anesthetic administered: 49mg of Fentanyl, 4.077mof Versed. Image quality was good. The patient's vital signs; including heart rate, blood pressure, and oxygen saturation; remained stable throughout the procedure. The patient developed no complications during the procedure.  IMPRESSIONS  1. Left ventricular ejection fraction, by estimation, is 55 to 60%. The left ventricle has normal function.  2. Right ventricular systolic function is normal. The right ventricular size is normal.  3. No left atrial/left atrial appendage thrombus was detected.  4. The mitral valve is normal in structure. Trivial mitral valve regurgitation.  5. The aortic valve is tricuspid. Aortic valve regurgitation is trivial.  6. Agitated saline contrast bubble study was negative, with no evidence of any interatrial shunt both at rest and with valsalva. There was one late bubble consistent with a small intrapulmonary shunt. FINDINGS  Left Ventricle: Left ventricular ejection fraction, by estimation, is 55 to 60%. The left ventricle has normal function. The left ventricular internal cavity size was normal in size. Right Ventricle: The right ventricular size is normal. No increase in right ventricular wall thickness. Right ventricular systolic function is normal. Left Atrium: Left atrial size was normal in size. No left atrial/left atrial appendage thrombus was detected. Right Atrium: Right atrial size was normal in size. Pericardium: There is no  evidence of pericardial effusion. Mitral Valve: The mitral valve is normal in structure. Trivial mitral valve regurgitation. Tricuspid Valve: The tricuspid valve is normal in structure. Tricuspid valve regurgitation is trivial. Aortic Valve: The aortic valve is tricuspid. Aortic valve regurgitation is trivial. Pulmonic Valve: The pulmonic valve was normal in structure. Pulmonic valve regurgitation is trivial. Aorta: The aortic root is normal in size and structure. IAS/Shunts: No atrial level shunt detected by color flow Doppler. Agitated saline contrast was given intravenously to evaluate for intracardiac shunting. Agitated saline contrast bubble study was negative, with no evidence of any interatrial shunt. Additional Comments: Spectral Doppler performed. HeGwyndolyn KaufmanD Electronically signed by HeGwyndolyn KaufmanD Signature Date/Time: 03/23/2022/12:56:54 PM    Final    DG Abd Portable 1V  Result Date: 03/22/2022 CLINICAL DATA:  Encounter for feeding tube placement. EXAM: PORTABLE ABDOMEN - 1 VIEW COMPARISON:  None Available. FINDINGS: The tip of the weighted enteric tube is just to the right of midline in the region of the distal stomach or proximal duodenum. There is mild gaseous distention of bowel in the central abdomen. IMPRESSION: Tip of the weighted enteric tube in the distal stomach or proximal duodenum. Electronically Signed   By: MeKeith Rake.D.   On: 03/22/2022 13:35   DG Chest Port 1 View  Result Date: 03/22/2022 CLINICAL DATA:  Respiratory failure and status post tracheostomy. EXAM: PORTABLE CHEST 1 VIEW COMPARISON:  Film earlier today at 0758 hours FINDINGS: Stable heart size. Interval tracheostomy and removal of endotracheal tube. The tracheostomy tube appears in appropriate position. Standard gastric decompression tube has been removed and a Dobbhoff feeding tube placed. Relatively stable right lower lobe atelectasis. No pneumothorax. IMPRESSION: Appropriate position of  tracheostomy tube. Stable right lower lobe atelectasis. Removal of standard  gastric decompression tube and placement of Dobbhoff feeding tube. Electronically Signed   By: Aletta Edouard M.D.   On: 03/22/2022 12:50   [place] EKG SITE RITE  Result Date: 03/22/2022 If Site Rite image not attached, placement could not be confirmed due to current cardiac rhythm.  DG Chest Port 1 View  Result Date: 03/22/2022 CLINICAL DATA:  Respiratory failure EXAM: PORTABLE CHEST 1 VIEW COMPARISON:  Chest radiograph 1 day prior FINDINGS: Endotracheal tube tip is proximally 3.1 cm from the carina. The enteric catheter tip is off field of view The cardiomediastinal silhouette is normal There is a small right pleural effusion which may be slightly increased since prior study. Hazy opacities in the right lower lobe are favored to reflect atelectasis. The previously seen small left pleural effusion appears resolved with improved aeration of the left lower lobe. Aeration is otherwise unchanged. There is no pneumothorax The bones are stable. IMPRESSION: 1. New small right pleural effusion with probable adjacent atelectasis. 2. Resolved small left pleural effusion with improved aeration of the left lower lobe. Electronically Signed   By: Valetta Mole M.D.   On: 03/22/2022 08:18   VAS [place] LOWER EXTREMITY VENOUS (DVT)  Result Date: 03/21/2022  Lower Venous DVT Study Patient Name:  VALADA ANKENEY  Date of Exam:   03/21/2022 Medical Rec #: [id number]         Accession #:    [id number] Date of Birth: [birth year]          Patient Gender: F Patient Age:   49 years Exam Location:  [facility] Procedure:      VAS [place] LOWER EXTREMITY VENOUS (DVT) Referring Phys: Cornelius Moras Madissen Wyse --------------------------------------------------------------------------------  Other Indications: Spinal stroke, paralysis. Limitations: Ventilation, paralysis. Comparison Study: No prior study on file Performing Technologist: Sharion Dove RVS  Examination Guidelines: A  complete evaluation includes B-mode imaging, spectral Doppler, color Doppler, and power Doppler as needed of all accessible portions of each vessel. Bilateral testing is considered an integral part of a complete examination. Limited examinations for reoccurring indications may be performed as noted. The reflux portion of the exam is performed with the patient in reverse Trendelenburg.  +---------+---------------+---------+-----------+----------+-------------------+ RIGHT    CompressibilityPhasicitySpontaneityPropertiesThrombus Aging      +---------+---------------+---------+-----------+----------+-------------------+ CFV      Full           Yes      Yes                                      +---------+---------------+---------+-----------+----------+-------------------+ SFJ      Full                                                             +---------+---------------+---------+-----------+----------+-------------------+ FV Prox  Full                                                             +---------+---------------+---------+-----------+----------+-------------------+ FV Mid   Full                                                             +---------+---------------+---------+-----------+----------+-------------------+  FV DistalFull                                                             +---------+---------------+---------+-----------+----------+-------------------+ PFV      Full                                                             +---------+---------------+---------+-----------+----------+-------------------+ POP                     Yes      Yes                  patent by color and                                                       Doppler             +---------+---------------+---------+-----------+----------+-------------------+ PTV      Full                                                              +---------+---------------+---------+-----------+----------+-------------------+ PERO     Full                                                             +---------+---------------+---------+-----------+----------+-------------------+   +---------+---------------+---------+-----------+----------+--------------+ LEFT     CompressibilityPhasicitySpontaneityPropertiesThrombus Aging +---------+---------------+---------+-----------+----------+--------------+ CFV      Full           Yes      Yes                                 +---------+---------------+---------+-----------+----------+--------------+ SFJ      Full                                                        +---------+---------------+---------+-----------+----------+--------------+ FV Prox  Full                                                        +---------+---------------+---------+-----------+----------+--------------+ FV Mid   Full                                                        +---------+---------------+---------+-----------+----------+--------------+  FV DistalFull                                                        +---------+---------------+---------+-----------+----------+--------------+ PFV      Full                                                        +---------+---------------+---------+-----------+----------+--------------+ POP      Full           Yes      Yes                                 +---------+---------------+---------+-----------+----------+--------------+ PTV      Full                                                        +---------+---------------+---------+-----------+----------+--------------+ PERO     Full                                                        +---------+---------------+---------+-----------+----------+--------------+     Summary: BILATERAL: - No evidence of deep vein thrombosis seen in the lower extremities, bilaterally.  -No evidence of popliteal cyst, bilaterally.   *See table(s) above for measurements and observations. Electronically signed by Deitra Mayo MD on 03/21/2022 at 8:20:57 PM.    Final    DG Chest Port 1 View  Result Date: 03/21/2022 CLINICAL DATA:  Intubation.  Respiratory distress. EXAM: PORTABLE CHEST 1 VIEW COMPARISON:  Chest radiograph dated 03/20/2022. FINDINGS: Endotracheal tube with tip approximately 4 cm above the carina. Enteric tube extends below the diaphragm with tip beyond the inferior margin of the image. Improved aeration of the left lower lobe since the earlier radiograph. Minimal right base atelectasis. No pleural effusion or pneumothorax. The cardiac silhouette is within limits. No acute osseous pathology. IMPRESSION: 1. Endotracheal tube above the carina. 2. Improved aeration of the left lower lobe. Electronically Signed   By: Anner Crete M.D.   On: 03/21/2022 01:10   ECHOCARDIOGRAM COMPLETE  Result Date: 03/20/2022    ECHOCARDIOGRAM REPORT   Patient Name:   ANNEY HILGEMAN Date of Exam: 03/20/2022 Medical Rec #:  [id number]        Height:       67.0 in Accession #:    [id number]       Weight:       162.9 lb Date of Birth:  [birth year]         BSA:          1.853 m Patient Age:    22 years         BP:           100/52 mmHg Patient Gender: F  HR:           63 bpm. Exam Location:  Inpatient Procedure: 2D Echo, Cardiac Doppler, Color Doppler and Strain Analysis Indications:    Stroke I63.9  History:        Patient has no prior history of Echocardiogram examinations.  Sonographer:    [place] Referring Phys: [id number] [facility]  Sonographer Comments: Echo performed with patient supine and on artificial respirator. IMPRESSIONS  1. Left ventricular ejection fraction, by estimation, is 55 to 60%. The left ventricle has normal function. The left ventricle has no regional wall motion abnormalities. Left ventricular diastolic parameters were normal. The average left  ventricular global longitudinal strain is -25.9 %. The global longitudinal strain is normal.  2. Right ventricular systolic function is normal. The right ventricular size is normal. There is normal pulmonary artery systolic pressure.  3. The mitral valve is normal in structure. No evidence of mitral valve regurgitation. No evidence of mitral stenosis.  4. The aortic valve is tricuspid. Aortic valve regurgitation is not visualized. No aortic stenosis is present.  5. The inferior vena cava is normal in size with greater than 50% respiratory variability, suggesting right atrial pressure of 3 mmHg. FINDINGS  Left Ventricle: Left ventricular ejection fraction, by estimation, is 55 to 60%. The left ventricle has normal function. The left ventricle has no regional wall motion abnormalities. The average left ventricular global longitudinal strain is -25.9 %. The global longitudinal strain is normal. The left ventricular internal cavity size was normal in size. There is no left ventricular hypertrophy. Left ventricular diastolic parameters were normal. Right Ventricle: The right ventricular size is normal. No increase in right ventricular wall thickness. Right ventricular systolic function is normal. There is normal pulmonary artery systolic pressure. The tricuspid regurgitant velocity is 2.35 m/s, and  with an assumed right atrial pressure of 8 mmHg, the estimated right ventricular systolic pressure is 0000000 mmHg. Left Atrium: Left atrial size was normal in size. Right Atrium: Right atrial size was normal in size. Pericardium: There is no evidence of pericardial effusion. Mitral Valve: The mitral valve is normal in structure. No evidence of mitral valve regurgitation. No evidence of mitral valve stenosis. Tricuspid Valve: The tricuspid valve is normal in structure. Tricuspid valve regurgitation is mild . No evidence of tricuspid stenosis. Aortic Valve: The aortic valve is tricuspid. Aortic valve regurgitation is not  visualized. No aortic stenosis is present. Pulmonic Valve: The pulmonic valve was normal in structure. Pulmonic valve regurgitation is not visualized. No evidence of pulmonic stenosis. Aorta: The aortic root is normal in size and structure. Venous: The inferior vena cava is normal in size with greater than 50% respiratory variability, suggesting right atrial pressure of 3 mmHg. IAS/Shunts: No atrial level shunt detected by color flow Doppler.  LEFT VENTRICLE PLAX 2D LVIDd:         4.70 cm   Diastology LVIDs:         2.60 cm   LV e' medial:    9.25 cm/s LV PW:         1.10 cm   LV E/e' medial:  8.8 LV IVS:        1.00 cm   LV e' lateral:   19.60 cm/s LVOT diam:     2.30 cm   LV E/e' lateral: 4.1 LV SV:         85 LV SV Index:   46        2D Longitudinal Strain LVOT Area:  4.15 cm  2D Strain GLS Avg:     -25.9 %  RIGHT VENTRICLE             IVC RV S prime:     12.60 cm/s  IVC diam: 2.00 cm TAPSE (M-mode): 2.2 cm LEFT ATRIUM             Index        RIGHT ATRIUM           Index LA diam:        3.40 cm 1.83 cm/m   RA Area:     14.90 cm LA Vol (A2C):   47.5 ml 25.63 ml/m  RA Volume:   38.50 ml  20.77 ml/m LA Vol (A4C):   29.8 ml 16.08 ml/m LA Biplane Vol: 39.9 ml 21.53 ml/m  AORTIC VALVE LVOT Vmax:   97.45 cm/s LVOT Vmean:  62.650 cm/s LVOT VTI:    0.206 m  AORTA Ao Root diam: 2.70 cm Ao Asc diam:  2.30 cm Ao Desc diam: 2.00 cm MITRAL VALVE               TRICUSPID VALVE MV Area (PHT): 2.91 cm    TR Peak grad:   22.1 mmHg MV Decel Time: 261 msec    TR Vmax:        235.00 cm/s MV E velocity: 81.20 cm/s MV A velocity: 52.70 cm/s  SHUNTS MV E/A ratio:  1.54        Systemic VTI:  0.21 m                            Systemic Diam: 2.30 cm Jenkins Rouge MD Electronically signed by Jenkins Rouge MD Signature Date/Time: 03/20/2022/11:01:55 AM    Final    DG CHEST PORT 1 VIEW  Result Date: 03/20/2022 CLINICAL DATA:  Shortness of breath EXAM: PORTABLE CHEST 1 VIEW COMPARISON:  03/22/2022 FINDINGS: Endotracheal tube with  tip between the clavicular heads and carina. An enteric tube reaches the stomach. Artifact from EKG leads. Linear opacity in the right mid lung. Dense opacity with volume loss at the left base. No pneumothorax IMPRESSION: 1. Worsening left lower lobe aeration with volume loss. Mild atelectasis on the right. 2. Unremarkable hardware positioning. Electronically Signed   By: Jorje Guild M.D.   On: 03/20/2022 06:42   MR CERVICAL SPINE W WO CONTRAST  Result Date: 03/20/2022 CLINICAL DATA:  Myelopathy EXAM: MRI CERVICAL SPINE WITHOUT AND WITH CONTRAST TECHNIQUE: Multiplanar and multiecho pulse sequences of the cervical spine, to include the craniocervical junction and cervicothoracic junction, were obtained without and with intravenous contrast. CONTRAST:  7.5 mL Gadavist COMPARISON:  No prior MRI of the cervical spine, correlation is made with same day MRI head and CT cervical spine FINDINGS: Alignment: No listhesis. Vertebrae: No fracture, evidence of discitis, or bone lesion. Cord: Restricted diffusion with ADC correlate along the bilateral anterior aspect of the spinal cord from the craniocervical junction (series 6, image 93) to the level of C7 (series 6, image 56). This correlates with increased T2 hyperintense signal, most likely in the anterior horn cells. Spinal cord is otherwise normal in caliber and signal. No abnormal enhancement. Posterior Fossa, vertebral arteries, paraspinal tissues: Negative. Disc levels: Preserved disc height and disc hydration. No spinal canal stenosis or neural foraminal narrowing. IMPRESSION: Restricted diffusion and increased T2 signal along the anterior aspect of the spinal cord from the craniocervical junction to the level of C7,  concerning for acute spinal cord ischemia in an anterior spinal artery pattern. These findings were discussed by telephone on 03/20/2022 at 3:30 is am with provider [facility] . Electronically Signed   By: Merilyn Baba M.D.   On: 03/20/2022  03:54   MR BRAIN WO CONTRAST  Result Date: 03/20/2022 CLINICAL DATA:  Altered mental status, stroke suspected EXAM: MRI HEAD WITHOUT CONTRAST TECHNIQUE: Multiplanar, multiecho pulse sequences of the brain and surrounding structures were obtained without intravenous contrast. COMPARISON:  None Available. FINDINGS: Brain: Restricted diffusion with ADC correlate in the anteromedial cervical spinal cord (series 5, image 54) at the craniocervical junction. No other restricted diffusion to suggest acute or subacute infarct. No acute hemorrhage, mass, mass effect, or midline shift. No hydrocephalus or extra-axial collection. Normal pituitary and craniocervical junction. No hemosiderin deposition to suggest remote hemorrhage. Normal cerebral volume for age. Vascular: Normal arterial flow voids. Skull and upper cervical spine: Normal marrow signal. Sinuses/Orbits: Clear paranasal sinuses. No acute finding in the orbits. Other: Trace fluid in the right mastoid air cells. IMPRESSION: 1. 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 Attention on subsequent MRI cervical spine. 2. No other acute intracranial process. These findings were discussed by telephone on 03/20/2022 at 3:05 am with provider [facility] . Electronically Signed   By: Merilyn Baba M.D.   On: 03/20/2022 03:14   CT C-SPINE NO CHARGE  Result Date: 03/20/2022 CLINICAL DATA:  Stroke code, concern for cervical pathology EXAM: CT CERVICAL SPINE WITHOUT CONTRAST TECHNIQUE: Multidetector CT imaging of the cervical spine was performed without intravenous contrast. Multiplanar CT image reconstructions were also generated. RADIATION DOSE REDUCTION: This exam was performed according to the departmental dose-optimization program which includes automated exposure control, adjustment of the mA and/or kV according to patient size and/or use of iterative  reconstruction technique. COMPARISON:  None Available. FINDINGS: Alignment: No listhesis. Straightening of the normal cervical lordosis, which may be positional. Skull base and vertebrae: No acute fracture. No primary bone lesion or focal pathologic process. Soft tissues and spinal canal: No prevertebral fluid or swelling. No visible canal hematoma. Disc levels: Disc heights are preserved. No significant degenerative changes. No significant spinal canal stenosis or neural foraminal narrowing. Upper chest: No focal pulmonary opacity or pleural effusion. Other: Endotracheal and orogastric tubes noted. IMPRESSION: No acute or chronic abnormality in the cervical spine. Electronically Signed   By: Merilyn Baba M.D.   On: 03/20/2022 03:00   CT ANGIO HEAD NECK W WO CM W PERF (CODE STROKE)  Result Date: 03/20/2022 CLINICAL DATA:  Stroke suspected, extremity numbness and weakness EXAM: CT ANGIOGRAPHY HEAD AND NECK CT PERFUSION TECHNIQUE: Multidetector CT imaging of the head and neck was performed using the standard protocol during bolus administration of intravenous contrast. Multiplanar CT image reconstructions and MIPs were obtained to evaluate the vascular anatomy. Carotid stenosis measurements (when applicable) are obtained utilizing NASCET criteria, using the distal internal carotid diameter as the denominator. Multiphase CT imaging of the brain was performed following IV bolus contrast injection. Subsequent parametric perfusion maps were calculated using RAPID software. RADIATION DOSE REDUCTION: This exam was performed according to the departmental dose-optimization program which includes automated exposure control, adjustment of the mA and/or kV according to patient size and/or use of iterative reconstruction technique. CONTRAST:  80 mL Omnipaque 300 COMPARISON:  No prior CTA, correlation is made with CT head 03/20/2022 FINDINGS: CT HEAD FINDINGS For noncontrast findings, please  see same day CT head. CTA NECK  FINDINGS Aortic arch: Two-vessel arch with a common origin of the brachiocephalic and left common carotid arteries. Imaged portion shows no evidence of aneurysm or dissection. No significant stenosis of the major arch vessel origins. Right carotid system: No evidence of stenosis, dissection, or occlusion. Left carotid system: No evidence of stenosis, dissection, or occlusion. Vertebral arteries: No evidence of stenosis, dissection, or occlusion. Skeleton: No acute osseous abnormality.  No visible canal hematoma. Other neck: Endotracheal and orogastric tubes noted. Otherwise negative. Upper chest: Negative. Review of the MIP images confirms the above findings CTA HEAD FINDINGS Anterior circulation: Both internal carotid arteries are patent to the termini, without significant stenosis. A1 segments patent. Normal anterior communicating artery. Anterior cerebral arteries are patent to their distal aspects. No M1 stenosis or occlusion. MCA branches perfused and symmetric. Posterior circulation: Vertebral arteries patent to the vertebrobasilar junction without stenosis. Posterior inferior cerebellar arteries patent proximally. Basilar patent to its distal aspect. Superior cerebellar arteries patent proximally. Patent P1 segments. PCAs perfused to their distal aspects without stenosis. The bilateral posterior communicating arteries are diminutive but patent. Venous sinuses: As permitted by contrast timing, patent. Anatomic variants: None significant. Review of the MIP images confirms the above findings CT Brain Perfusion Findings: ASPECTS: 10 CBF (<30%) Volume: 38m Perfusion (Tmax>6.0s) volume: 045mMismatch Volume: 49m58mnfarction Location:None IMPRESSION: 1. No intracranial large vessel occlusion or significant stenosis. 2. No hemodynamically significant stenosis in the neck. 3. No evidence of infarct core or penumbra on CT perfusion. Electronically Signed   By: AliMerilyn BabaD.   On: 03/20/2022 02:43   CT HEAD CODE  STROKE WO CONTRAST  Result Date: 03/20/2022 CLINICAL DATA:  Code stroke. EXAM: CT HEAD WITHOUT CONTRAST TECHNIQUE: Contiguous axial images were obtained from the base of the skull through the vertex without intravenous contrast. RADIATION DOSE REDUCTION: This exam was performed according to the departmental dose-optimization program which includes automated exposure control, adjustment of the mA and/or kV according to patient size and/or use of iterative reconstruction technique. COMPARISON:  03/11/2022 FINDINGS: Brain: No evidence of acute infarction, hemorrhage, mass, mass effect, or midline shift. No hydrocephalus or extra-axial collection. Vascular: No hyperdense vessel. Skull: Negative for fracture or focal lesion. Sinuses/Orbits: No acute finding. Other: The mastoid air cells are well aerated. ASPECTS ([facility] Early CT Score) - Ganglionic level infarction (caudate, lentiform nuclei, internal capsule, insula, M1-M3 cortex): 7 - Supraganglionic infarction (M4-M6 cortex): 3 Total score (0-10 with 10 being normal): 10 IMPRESSION: No evidence of acute intracranial abnormality. ASPECTS is 10. Imaging results were communicated on 03/20/2022 at 2:19 am to provider Dr. KhaLorrin Goodella secure text paging. Electronically Signed   By: AliMerilyn BabaD.   On: 03/20/2022 02:19   DG CHEST PORT 1 VIEW  Result Date: 03/20/2022 CLINICAL DATA:  Check gastric catheter placement EXAM: PORTABLE CHEST 1 VIEW COMPARISON:  Film from earlier in the same day. FINDINGS: Endotracheal tube is noted in satisfactory position. Gastric catheter extends into the stomach. Lungs are well aerated bilaterally. Mild left basilar atelectasis and small effusion is noted. No other focal abnormality is seen. IMPRESSION: Tubes and lines in satisfactory position. Mild left basilar atelectasis. Electronically Signed   By: MarInez CatalinaD.   On: 03/20/2022 00:12   DG Chest Portable 1 View  Result Date: 03/31/2022 CLINICAL DATA:  OG  tube placement EXAM: PORTABLE CHEST 1 VIEW COMPARISON:  03/23/2022, 8:44 p.m. FINDINGS: Similar position of endotracheal tube, positioned over the  midtrachea. Similar position of esophagogastric tube, malpositioned, tip over the trachea. Unchanged elevation of the left hemidiaphragm. Heart and mediastinum unremarkable. Osseous structures unremarkable. IMPRESSION: 1. Similar position of esophagogastric tube, malpositioned, tip over the trachea. Recommend repositioning to ensure positioned within the esophagus. 2. Similar position of endotracheal tube, positioned over the midtrachea. These results will be called to the ordering clinician or representative by the Radiologist Assistant, and communication documented in the PACS or [employer]. Electronically Signed   By: Delanna Ahmadi M.D.   On: 03/25/2022 21:33   DG Chest Portable 1 View  Result Date: 03/20/2022 CLINICAL DATA:  Post intubation. EXAM: PORTABLE CHEST 1 VIEW COMPARISON:  03/04/2022. FINDINGS: Heart is enlarged and the mediastinal contour is within normal limits. No consolidation, effusion, or pneumothorax. A soft tissue density is present over the left lung field, likely reflecting breast tissue. The endotracheal tube terminates 5.1 cm above the carina. An enteric tube terminates in the mid esophagus and should be advanced approximately 26 cm. IMPRESSION: 1. No active disease. 2. An enteric tube terminates in the proximal to mid esophagus and should be advanced 26 cm. 3. Endotracheal tube terminates 5.1 cm above the carina. Electronically Signed   By: Brett Fairy M.D.   On: 03/12/2022 20:57   DG Chest Portable 1 View  Result Date: 03/11/2022 CLINICAL DATA:  Cough weakness EXAM: PORTABLE CHEST 1 VIEW COMPARISON:  None Available. FINDINGS: Hazy opacity at the left base. Right lung is clear. Normal cardiac size. No pneumothorax IMPRESSION: Hazy opacity at the left base may reflect atelectasis or mild pneumonia. Electronically Signed   By: Donavan Foil M.D.   On: 03/26/2022 18:56   CT HEAD WO CONTRAST  Result Date: 03/14/2022 CLINICAL DATA:  Weakness headache unable to move arms and legs EXAM: CT HEAD WITHOUT CONTRAST TECHNIQUE: Contiguous axial images were obtained from the base of the skull through the vertex without intravenous contrast. RADIATION DOSE REDUCTION: This exam was performed according to the departmental dose-optimization program which includes automated exposure control, adjustment of the mA and/or kV according to patient size and/or use of iterative reconstruction technique. COMPARISON:  None Available. FINDINGS: Brain: No evidence of acute infarction, hemorrhage, hydrocephalus, extra-axial collection or mass lesion/mass effect. Vascular: No hyperdense vessel or unexpected calcification. Skull: Normal. Negative for fracture or focal lesion. Sinuses/Orbits: No acute finding. Other: None IMPRESSION: Negative non contrasted CT appearance of the brain. Electronically Signed   By: Donavan Foil M.D.   On: 03/09/2022 18:55     PHYSICAL EXAM  Temp:  [96.4 F (35.8 C)-99.5 F (37.5 C)] 99.1 F (37.3 C) (02/21 1512) Pulse Rate:  [60-86] 84 (02/21 1512) Resp:  [25-26] 26 (02/21 1512) BP: (107-149)/(56-86) 117/70 (02/21 1116) SpO2:  [92 %-100 %] 96 % (02/21 1512) FiO2 (%):  [40 %-50 %] 40 % (02/21 1512) Weight:  [101.3 kg] 101.3 kg (02/21 0500)  General - Well nourished, well developed, in no apparent distress, s/p trach.  Ophthalmologic - fundi not visualized due to noncooperation.  Cardiovascular - Regular rhythm and rate.  Neuro - awake alert eyes open, s/p trach still on vent but able to mouth answers, fully orientated, no aphasia, following all simple commands. Able to name and repeat with mouthing words. No gaze palsy, tracking bilaterally, visual field full, PERRL.  Facial symmetrical.  Tongue protrusion midline.  Slight neck bilateral movement, not able to shrug shoulders.  Bilateral UEs and LEs 0/5, no movement,  flaccid without tone. Sensation symmetrical bilaterally subjectively, preserved positional  sensation, light touch, b/l DTR diminished, no Babinski, gait not tested.     ASSESSMENT/PLAN Ms. Cieara Fisk is a 22 y.o. female with history of anxiety, depression, smoker with birth control depot, alcohol abuse, THC user admitted for acute arm and leg weakness, respiratory failure, nausea vomiting difficulty speaking but able to mouth words. No tPA given due to outside window.    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  MRI C-spine signal change at anterior aspect of the spinal cord from the craniocervical junction to the level of C7, concerning for acute spinal cord ischemia in an anterior spinal artery pattern. 2D Echo  EF 55-60% LE venous doppler no DVT TEE unremarkable, no PFO LDL 111 HgbA1c 5.3 Hypercoagulable work up largely negative, only beta2-glycoprotein IgM slightly high at 42 (will need repeat in 3 months) Autoimmune work up neg (ANA, ANCA, RF) ESR/CRP/RPR/HIV neg B12 = 193, relatively low Heparin subq for VTE prophylaxis No antithrombotic prior to admission, now on aspirin 81 mg daily. 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 Will provide education of statin in childbearing age women) Continue statin at discharge  Nexplanon implant Daily smoker Pt had Nexplanon implanted 2 years ago Daily smoker, 1PPD Smoking cessation counseling provided Negative DVT Discussed with Dr. Hollice Gong and he felt Nexplanon has low risk for thromboembolic event, he will need further discussion with pt before removing   Alcohol abuse Daily drinker, at least half pint Will educate on alcohol limitation CIWA protocol FA/B1/MVI  B12 deficiency B12 = 193, relatively low end of normal B12 supplement  Other Stroke Risk Factors Daily THC user, will education on cessation   Other Active Problems Back pain due to scoliosis, on Percocet Xanax as needed for anxiety/depression Dysphagia - on TF, [facility] day # 5   Rosalin Hawking, MD PhD Stroke Neurology 03/24/2022 4:35 PM    To contact Stroke Continuity provider, please refer to [email or web address] After hours, contact General Neurology

## 2022-03-24 NOTE — Progress Notes (Signed)
eLink Physician-Brief Progress Note Patient Name: Rachael [name] DOB: 2000/08/07 MRN: [id number]   Date of Service  03/24/2022  HPI/Events of Note  Shortness of breath with slight drop in saturation level.  eICU Interventions  Stat portable CXR ordered.        Frederik Pear 03/24/2022, 11:33 PM

## 2022-03-24 NOTE — Progress Notes (Signed)
Physical Therapy Treatment Patient Details Name: Rachael [name] MRN: [id number] DOB: [birth year] Today's Date: 03/24/2022   History of Present Illness Rachael [name] is a 22 y.o. female admitted 03/23/2022 with acute arms and leg weakness, dyspnea and hypercabia.  (+) for polysubstances. Pt was intubated on 2/16.   MRI confirms anteromedial high cervical spinal cord and lower medulla stroke consistent with an anterior spinal artery stroke  PMH significant for depression, HLD, Kidney abscess    PT Comments    Pt pleasant, alert, communicating with mouthing throughout session and reports heaviness and tingling throughout entire body and able to sense pressure and pain but not temperature. Pt with PROM performed bil UE and bil LE. Pt tolerating chair position 21 min as first trial of HOB 60 degrees partial chair. Family not present and encouraged pt to have family assist with moving extremities throughout the day and progressing to increased chair positioning and sitting throughout the day for tolerance for upright.  PRVC 40%, peep 5 with SPO2 >94% BP supine 117/70 Initial sitting 119/78 5 min sitting 107/86 20 min sitting 102/48   Recommendations for follow up therapy are one component of a multi-disciplinary discharge planning process, led by the attending physician.  Recommendations may be updated based on patient status, additional functional criteria and insurance authorization.  Follow Up Recommendations  Skilled nursing-short term rehab (<3 hours/day) Can patient physically be transported by private vehicle: No   Assistance Recommended at Discharge Frequent or constant Supervision/Assistance  Patient can return home with the following Two people to help with walking and/or transfers;Two people to help with bathing/dressing/bathroom;Assistance with cooking/housework;Assistance with feeding;Direct supervision/assist for medications management;Direct supervision/assist for financial  management;Assist for transportation;Help with stairs or ramp for entrance   Equipment Recommendations  Hospital bed;Wheelchair (measurements PT);Wheelchair cushion (measurements PT);Other (comment) (hoyer, tilt in space power chair)    Recommendations for Other Services       Precautions / Restrictions Precautions Precautions: Other (comment) Precaution Comments: Trach, vent, cortrak, quadriplegia, flexiseal     Mobility  Bed Mobility Overal bed mobility: Needs Assistance             General bed mobility comments: total +2, Transitioned from supine to chair position 60 degrees 21 min for ROM with maintained BP    Transfers                   General transfer comment: total assist will need lift    Ambulation/Gait                   Stairs             Wheelchair Mobility    Modified Rankin (Stroke Patients Only)       Balance Overall balance assessment: Needs assistance     Sitting balance - Comments: pt able to sit at 60 degrees without lean once positioned in midline, physical assist to move head and neck                                    Cognition Arousal/Alertness: Awake/alert Behavior During Therapy: Flat affect Overall Cognitive Status: Within Functional Limits for tasks assessed                                 General Comments: responding appropriately to all questions with mouthing words  Exercises General Exercises - Upper Extremity Shoulder Flexion: PROM, Both, Seated, 10 reps Shoulder ABduction: PROM, Both, Seated, 10 reps Elbow Flexion: PROM, Both, Seated, 10 reps Elbow Extension: PROM, Both, Seated, 10 reps Wrist Flexion: PROM, Both, Seated, 10 reps Wrist Extension: PROM, Both, Seated, 10 reps General Exercises - Lower Extremity Ankle Circles/Pumps: PROM, Both, 10 reps, Seated Short Arc Quad: PROM, Both, Seated, 10 reps Hip ABduction/ADduction: PROM, Both, Seated, 10 reps Hip  Flexion/Marching: PROM, Both, Seated, 10 reps    General Comments        Pertinent Vitals/Pain Pain Assessment Pain Assessment: No/denies pain    Home Living                          Prior Function            PT Goals (current goals can now be found in the care plan section) Progress towards PT goals: Progressing toward goals    Frequency    Min 2X/week      PT Plan Discharge plan needs to be updated    Co-evaluation              AM-PAC PT "6 Clicks" Mobility   Outcome Measure  Help needed turning from your back to your side while in a flat bed without using bedrails?: Total Help needed moving from lying on your back to sitting on the side of a flat bed without using bedrails?: Total Help needed moving to and from a bed to a chair (including a wheelchair)?: Total Help needed standing up from a chair using your arms (e.g., wheelchair or bedside chair)?: Total Help needed to walk in hospital room?: Total Help needed climbing 3-5 steps with a railing? : Total 6 Click Score: 6    End of Session   Activity Tolerance: Patient tolerated treatment well Patient left: in bed;with call bell/phone within reach Nurse Communication: Mobility status;Need for lift equipment PT Visit Diagnosis: Muscle weakness (generalized) (M62.81);Other symptoms and signs involving the nervous system (R29.898)     Time: [id number] PT Time Calculation (min) (ACUTE ONLY): 26 min  Charges:  $Therapeutic Exercise: 23-37 mins                     Bayard Males, PT Acute Rehabilitation Services Office: [phone]    Sandy Salaam Janett Kamath 03/24/2022, 11:48 AM

## 2022-03-24 NOTE — Progress Notes (Signed)
NAME:  Rachael [name], MRN:  [id number], DOB:  [birth year], LOS: 5 ADMISSION DATE:  03/09/2022, CONSULTATION DATE:  03/24/22 REFERRING MD:  drawbridge, CHIEF COMPLAINT:  shortness of breath and weakness   History of Present Illness:  Rachael [name] is a 22 y.o. F with history of depression and suicidal ideation who presented to [facility] ED with sudden onset of shortness of breath and upper and lower extremity weakness.  History taken from Epic as patient is intubated and no family is at the bedside.  She initially had a headache and back pain prior to onset but denied abdominal pain or chest pain.  Vitals showed an initial temp of 15F, bloodwork with WBC 18k, UDS + for bezos and THC.   Her symptoms progressed to the point she wasn't speaking and ABG showed hypercarbic respiratory failure so pt was intubated.  She had some bradycardia and hypotension post-intubation, so was started on peripheral Levophed and given atropine during transport   Pertinent  Medical History   has a past medical history of Depression, Hyperlipidemia, and Kidney abscess.   Significant Hospital Events: Including procedures, antibiotic start and stop dates in addition to other pertinent events   2/16 presented to [facility] with all weakness and shortness of breath requiring intubation and transfer to [facility] 2/18 episodes of desaturations and hypercarbia unexplained 2/19 trach, cortrak, PICC  Interim History / Subjective:  Complains of being tired and thirsty today.  Objective   Blood pressure 117/70, pulse 81, temperature 99.5 F (37.5 C), resp. rate (!) 26, height 5' 7"$  (1.702 m), weight 101.3 kg, SpO2 96 %.    Vent Mode: PRVC FiO2 (%):  [40 %-100 %] 40 % Set Rate:  [26 bmp] 26 bmp Vt Set:  [490 mL] 490 mL PEEP:  [5 cmH20-10 cmH20] 5 cmH20 Plateau Pressure:  [20 cmH20-29 cmH20] 24 cmH20   Intake/Output Summary (Last 24 hours) at 03/24/2022 1413 Last data filed at 03/24/2022 1140 Gross per 24 hour  Intake 1695.53 ml   Output 2065 ml  Net -369.47 ml    Filed Weights   03/22/22 0500 03/23/22 1400 03/24/22 0500  Weight: 101.2 kg 104.7 kg 101.3 kg    General: Critically ill-appearing young woman lying in bed on mechanical ventilation HEENT: [place]/AT, eyes anicteric Neuro: Awake, alert, able to mouth words to communicate.  Unable to move hands or feet, unable to shrug shoulders.   CV: s1S2, RRR PULM: No rhonchi, some thick secretions from endotracheal tube. 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 Acute hypercapnic and hypoxic respiratory failure due to NM weakness Neurogenic bladder, urinary retention - etiology remains unclear; risk factors include heavy smoking, birth control, HLD -appreciate neurology's management -aspirin, statin, plavix after PEG -OB resistant to removing implanon -foley, but will eventually require bladder training -maintain neuroprotective measures -PT, OT as able -SLP to work with speech and swallow with trach -palliative care discussions regarding timelines and code status-- see their note  Acute hypoxic & hypercarbic respiratory failure requiring MV due  to NM weakness from C spine stroke. Acute hypercapnia with acute respiratory acidosis Aspiration pneumonia RLL -trach care per protocol -daily SAT & SBT -LTVV -VAP prevention protocol -can remove sutures after 7 days (2/26) -complete 7 days of antibiotics, switch to unasyn -wea FiO2  as able -CPT & hypertonic saline; anticipate she will require a pulmonary clearance regimen  Hypothermia, bradycardia Shock likely neurogenic -empiric antibiotics -NE to maintain MAP >65 as needed -external warming  Polysubstance abuse- tobacco, MJ. Has prescribed opiates and benzos. ETOH abuse  At risk for withdrawals  - con't zoloft and clonazepam PRN -vitamins -so far no signs of withdrawal  Loose stools -bowel regimen PRN -anticipate will need a chronic bowel regimen to prevent constipation  Anemia due to critical illness -transfuse for Hb <7 or hemodynamically significant bleeding  Hyperglycemia, mild and controlled w/o insulin -d/c routine accuchecks -goal BG <180  At risk for malnutrition -TF at goal -IR consulted for PEG  Hypervolemia -lasix x 1 today -strict I/O  Ms. Komm and her mother were updated at bedside today. All questions answered.   Best Practice (right click and "Reselect all SmartList Selections" daily)   Diet/type: tubefeeds DVT prophylaxis: LMWH GI prophylaxis: PPI Lines: Central line Foley:  yes Code Status:  full code>> per discussion with palliative now DNR Last date of multidisciplinary goals of care discussion [ mother updated at bedside 2/21]   Labs   CBC: Recent Labs  Lab 03/18/2022 1825 03/11/2022 1919 03/20/22 1010 03/21/22 0127 03/21/22 1140 03/21/22 2255 03/22/22 0154 03/22/22 0611 03/23/22 0417  WBC 18.3*  --  13.5*  --   --   --  20.0* 20.3* 15.0*  NEUTROABS 15.0*  --   --   --   --   --   --   --   --   HGB 13.8   < > 11.1*   < > 10.5* 11.6* 12.0 11.2* 10.2*  HCT 41.5   < > 34.9*   < > 31.0* 34.0* 37.0 34.4* 31.2*  MCV 84.3  --  87.5  --   --   --  88.7 87.3 89.7  PLT 299  --  221  --   --   --  177 193 169   < > = values in this interval not displayed.     Basic Metabolic Panel: Recent Labs  Lab 03/21/22 0339 03/21/22 0359 03/21/22 2255 03/22/22 0154 03/22/22 0611 03/22/22 2350 03/23/22 0550  03/24/22 0447  NA 138   < > 140  --  139 140 142 139  K 4.4   < > 3.8  --  3.6 3.2* 3.7 3.5  CL 109  --   --   --  106 109 105 106  CO2 20*  --   --   --  25 26 25 24  $ GLUCOSE 103*  --   --   --  146* 135* 116* 96  BUN 7  --   --   --  9 7 8 9  $ CREATININE 0.80  --   --   --  0.62 0.63 0.68 0.59  CALCIUM 8.4*  --   --   --  8.6* 7.9* 8.6* 8.1*  MG 2.3  --   --  2.2  --  1.9 2.8* 2.2  PHOS 3.4  --   --  1.4*  --  2.6 2.7 2.7   < > = values in this interval not displayed.    GFR: Estimated Creatinine Clearance: 136.1  mL/min (by C-G formula based on SCr of 0.59 mg/dL). Recent Labs  Lab 03/18/2022 1829 03/20/22 0030 03/20/22 1010 03/21/22 0339 03/22/22 0154 03/22/22 0611 03/23/22 0417  PROCALCITON  --   --  <0.10 <0.10 <0.10  --   --   WBC  --   --  13.5*  --  20.0* 20.3* 15.0*  LATICACIDVEN 0.7 1.8  --   --   --   --   --       Critical care time: 40 min.    Julian Hy, DO 03/24/22 2:13 PM [facility]  For contact information, see Amion. If no response to pager, please call PCCM consult pager. After hours, 7PM- 7AM, please call Elink.

## 2022-03-24 NOTE — Consult Note (Addendum)
Chief Complaint: Patient was seen in consultation today for spinal cord artery infarct  Referring Physician(s): Dr. Noemi Chapel  Supervising Physician: Aletta Edouard  Patient Status: [facility] - In-pt  History of Present Illness: Rachael [name] is a 22 y.o. female with history of HLD, kidney abscess, depression otherwise in good health without recent illness or injury who presented to [facility] ED with acute onset headache and back pain followed by bilateral upper and lower extremity weakness, trouble breathing.  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 Etiology remains unclear.  She is planning to proceed with long-term care in hopes of rehab and recovery.  She is s/p trach placement 2/19 and is now in need of gastrostomy tube placement.   CT Abdomen 2/20 reviewed by Dr. Annamaria Boots who approves patient for percutaneous gastrostomy tube placement.    PA to bedside.  Mother at bedside states that patient is sleeping and requests she be able to rest in peace this afternoon after multiple recent procedures and interruptions for ongoing care.  She also states Rachael [name] has been making her own decisions and that Rachael [name] should be the one to consent to medical procedures.   Past Medical History:  Diagnosis Date   Depression    few yrs ago [facility]), doing good now   Hyperlipidemia    Kidney abscess     Past Surgical History:  Procedure Laterality Date   NO PAST SURGERIES      Allergies: Patient has no known allergies.  Medications: Prior to Admission medications   Medication Sig Start Date End Date Taking? Authorizing Provider  cetirizine (ZYRTEC ALLERGY) 10 MG tablet Take 1 tablet (10 mg total) by mouth daily. Patient not taking: Reported on 03/20/2022 11/18/21   Jaynee Eagles, PA-C  predniSONE (DELTASONE) 20 MG tablet Take 2 tablets daily with breakfast. Patient not taking: Reported on 03/20/2022 11/18/21   Jaynee Eagles, PA-C   Prenat-FeCbn-FeAsp-Meth-FA-DHA (PRENATE MINI) 18-0.6-0.4-350 MG CAPS Take 1 tablet by mouth daily. Patient not taking: Reported on 03/20/2022 09/13/19   Elvera Maria, CNM  promethazine-dextromethorphan (PROMETHAZINE-DM) 6.25-15 MG/5ML syrup Take 2.5 mLs by mouth 3 (three) times daily as needed for cough. Patient not taking: Reported on 03/20/2022 11/18/21   Jaynee Eagles, PA-C     Family History  Problem Relation Age of Onset   Asthma Mother    Hypertension Other    Hyperlipidemia Maternal Grandmother    Hypertension Maternal Grandmother    Diabetes Paternal Grandmother     Social History   Socioeconomic History   Marital status: Single    Spouse name: Not on file   Number of children: Not on file   Years of education: Not on file   Highest education level: Not on file  Occupational History   Not on file  Tobacco Use   Smoking status: Former    Packs/day: 0.33    Years: 3.00    Total pack years: 0.99    Types: Cigarettes    Quit date: 04/02/2019    Years since quitting: 2.9    Passive exposure: Current   Smokeless tobacco: Never  Vaping Use   Vaping Use: Never used  Substance and Sexual Activity   Alcohol use: No   Drug use: Yes    Frequency: 7.0 times per week    Types: Marijuana    Comment: last was today   Sexual activity: Yes    Birth control/protection: None  Other Topics Concern   Not on file  Social History Narrative   Not on file   Social Determinants of Health   Financial Resource Strain: Not on file  Food Insecurity: No Food Insecurity (03/23/2022)   Hunger Vital Sign    Worried About Running Out of Food in the Last Year: Never true    Ran Out of Food in the Last Year: Never true  Transportation Needs: No Transportation Needs (03/23/2022)   PRAPARE - Hydrologist (Medical): No    Lack of Transportation (Non-Medical): No  Physical Activity: Not on file  Stress: Not on file  Social Connections: Not on file      Review of Systems: A 12 point ROS discussed and pertinent positives are indicated in the HPI above.  All other systems are negative.  Review of Systems  Unable to perform ROS: Acuity of condition    Vital Signs: BP 117/70   Pulse 81   Temp 99.5 F (37.5 C)   Resp (!) 26   Ht 5' 7"$  (1.702 m)   Wt 223 lb 5.2 oz (101.3 kg)   SpO2 96%   BMI 34.98 kg/m   Physical Exam Vitals and nursing note reviewed.  Constitutional:      General: She is not in acute distress.    Appearance: She is not ill-appearing.  HENT:     Mouth/Throat:     Mouth: Mucous membranes are moist.  Neck:     Comments: Trach in place Cardiovascular:     Rate and Rhythm: Normal rate and regular rhythm.  Pulmonary:     Effort: Pulmonary effort is normal.     Breath sounds: Normal breath sounds.  Abdominal:     General: Abdomen is flat.     Palpations: Abdomen is soft.  Skin:    General: Skin is warm and dry.      MD Evaluation Airway: WNL Heart: WNL Abdomen: WNL Chest/ Lungs: WNL ASA  Classification: 3 Mallampati/Airway Score: Two   Imaging: CT ABDOMEN WO CONTRAST  Result Date: 03/23/2022 CLINICAL DATA:  Unintended weight loss EXAM: CT ABDOMEN WITHOUT CONTRAST TECHNIQUE: Multidetector CT imaging of the abdomen was performed following the standard protocol without IV contrast. RADIATION DOSE REDUCTION: This exam was performed according to the departmental dose-optimization program which includes automated exposure control, adjustment of the mA and/or kV according to patient size and/or use of iterative reconstruction technique. COMPARISON:  03/22/2022, 06/21/2017 FINDINGS: Lower chest: Dense consolidation within the lower lobes, left greater than right, favor atelectasis. Trace bilateral pleural fluid. Hepatobiliary: Unremarkable unenhanced appearance of the liver and gallbladder. Pancreas: Unremarkable unenhanced appearance. Spleen: Unremarkable unenhanced appearance. Adrenals/Urinary Tract: No  urinary tract calculi or obstructive uropathy within either kidney. The adrenals are unremarkable. Stomach/Bowel: No bowel obstruction or ileus. No bowel wall thickening or inflammatory change. Enteric catheter extends into the gastric lumen, tip within the gastric body. Vascular/Lymphatic: No significant vascular findings are present. No abdominal lymphadenopathy. Other: No free fluid or free intraperitoneal gas. No abdominal wall hernia. Musculoskeletal: No acute or destructive bony lesions. Reconstructed images demonstrate no additional findings. IMPRESSION: 1. Dense bibasilar consolidation, left greater than right, favor atelectasis. 2. Enteric catheter within the gastric lumen. 3. Otherwise unremarkable unenhanced CT of the abdomen. Electronically Signed   By: Randa Ngo M.D.   On: 03/23/2022 22:32   ECHO TEE  Result Date: 03/23/2022    TRANSESOPHOGEAL ECHO REPORT   Patient Name:   CHLOEY SCHIESSL Date of Exam: 03/23/2022 Medical Rec #:  [id number]  Height:       67.0 in Accession #:    [id number]       Weight:       223.1 lb Date of Birth:  [birth year]         BSA:          2.118 m Patient Age:    22 years         BP:           134/79 mmHg Patient Gender: F                HR:           65 bpm. Exam Location:  Inpatient Procedure: Transesophageal Echo, Color Doppler, Cardiac Doppler and Saline            Contrast Bubble Study Indications:    Stroke  History:        Patient has prior history of Echocardiogram examinations, most                 recent 03/20/2022. Risk Factors:Dyslipidemia. Opiates, benzos,                 THC use; Nexplanon.  Sonographer:    Eartha Inch Referring Phys: [id number] Margie Billet PROCEDURE: After discussion of the risks and benefits of a TEE, an informed consent was obtained from the patient. The patient was recently extubated. The transesophogeal probe was passed without difficulty through the esophogus of the patient. Imaged were obtained with the patient in a supine  position. Sedation performed by performing physician. Patients was under conscious sedation during this procedure. Anesthetic administered: 36mg of Fentanyl, 4.057mof Versed. Image quality was good. The patient's vital signs; including heart rate, blood pressure, and oxygen saturation; remained stable throughout the procedure. The patient developed no complications during the procedure.  IMPRESSIONS  1. Left ventricular ejection fraction, by estimation, is 55 to 60%. The left ventricle has normal function.  2. Right ventricular systolic function is normal. The right ventricular size is normal.  3. No left atrial/left atrial appendage thrombus was detected.  4. The mitral valve is normal in structure. Trivial mitral valve regurgitation.  5. The aortic valve is tricuspid. Aortic valve regurgitation is trivial.  6. Agitated saline contrast bubble study was negative, with no evidence of any interatrial shunt both at rest and with valsalva. There was one late bubble consistent with a small intrapulmonary shunt. FINDINGS  Left Ventricle: Left ventricular ejection fraction, by estimation, is 55 to 60%. The left ventricle has normal function. The left ventricular internal cavity size was normal in size. Right Ventricle: The right ventricular size is normal. No increase in right ventricular wall thickness. Right ventricular systolic function is normal. Left Atrium: Left atrial size was normal in size. No left atrial/left atrial appendage thrombus was detected. Right Atrium: Right atrial size was normal in size. Pericardium: There is no evidence of pericardial effusion. Mitral Valve: The mitral valve is normal in structure. Trivial mitral valve regurgitation. Tricuspid Valve: The tricuspid valve is normal in structure. Tricuspid valve regurgitation is trivial. Aortic Valve: The aortic valve is tricuspid. Aortic valve regurgitation is trivial. Pulmonic Valve: The pulmonic valve was normal in structure. Pulmonic valve  regurgitation is trivial. Aorta: The aortic root is normal in size and structure. IAS/Shunts: No atrial level shunt detected by color flow Doppler. Agitated saline contrast was given intravenously to evaluate for intracardiac shunting. Agitated saline contrast bubble study was negative, with no evidence of any interatrial shunt.  Additional Comments: Spectral Doppler performed. Gwyndolyn Kaufman MD Electronically signed by Gwyndolyn Kaufman MD Signature Date/Time: 03/23/2022/12:56:54 PM    Final    DG Abd Portable 1V  Result Date: 03/22/2022 CLINICAL DATA:  Encounter for feeding tube placement. EXAM: PORTABLE ABDOMEN - 1 VIEW COMPARISON:  None Available. FINDINGS: The tip of the weighted enteric tube is just to the right of midline in the region of the distal stomach or proximal duodenum. There is mild gaseous distention of bowel in the central abdomen. IMPRESSION: Tip of the weighted enteric tube in the distal stomach or proximal duodenum. Electronically Signed   By: Keith Rake M.D.   On: 03/22/2022 13:35   DG Chest Port 1 View  Result Date: 03/22/2022 CLINICAL DATA:  Respiratory failure and status post tracheostomy. EXAM: PORTABLE CHEST 1 VIEW COMPARISON:  Film earlier today at 0758 hours FINDINGS: Stable heart size. Interval tracheostomy and removal of endotracheal tube. The tracheostomy tube appears in appropriate position. Standard gastric decompression tube has been removed and a Dobbhoff feeding tube placed. Relatively stable right lower lobe atelectasis. No pneumothorax. IMPRESSION: Appropriate position of tracheostomy tube. Stable right lower lobe atelectasis. Removal of standard gastric decompression tube and placement of Dobbhoff feeding tube. Electronically Signed   By: Aletta Edouard M.D.   On: 03/22/2022 12:50   [place] EKG SITE RITE  Result Date: 03/22/2022 If Site Rite image not attached, placement could not be confirmed due to current cardiac rhythm.  DG Chest Port 1 View  Result  Date: 03/22/2022 CLINICAL DATA:  Respiratory failure EXAM: PORTABLE CHEST 1 VIEW COMPARISON:  Chest radiograph 1 day prior FINDINGS: Endotracheal tube tip is proximally 3.1 cm from the carina. The enteric catheter tip is off field of view The cardiomediastinal silhouette is normal There is a small right pleural effusion which may be slightly increased since prior study. Hazy opacities in the right lower lobe are favored to reflect atelectasis. The previously seen small left pleural effusion appears resolved with improved aeration of the left lower lobe. Aeration is otherwise unchanged. There is no pneumothorax The bones are stable. IMPRESSION: 1. New small right pleural effusion with probable adjacent atelectasis. 2. Resolved small left pleural effusion with improved aeration of the left lower lobe. Electronically Signed   By: Valetta Mole M.D.   On: 03/22/2022 08:18   VAS [place] LOWER EXTREMITY VENOUS (DVT)  Result Date: 03/21/2022  Lower Venous DVT Study Patient Name:  KNYLA DWAN  Date of Exam:   03/21/2022 Medical Rec #: [id number]         Accession #:    [id number] Date of Birth: 06-12-2000          Patient Gender: F Patient Age:   36 years Exam Location:  [facility] Procedure:      VAS [place] LOWER EXTREMITY VENOUS (DVT) Referring Phys: Cornelius Moras XU --------------------------------------------------------------------------------  Other Indications: Spinal stroke, paralysis. Limitations: Ventilation, paralysis. Comparison Study: No prior study on file Performing Technologist: Sharion Dove RVS  Examination Guidelines: A complete evaluation includes B-mode imaging, spectral Doppler, color Doppler, and power Doppler as needed of all accessible portions of each vessel. Bilateral testing is considered an integral part of a complete examination. Limited examinations for reoccurring indications may be performed as noted. The reflux portion of the exam is performed with the patient in reverse Trendelenburg.   +---------+---------------+---------+-----------+----------+-------------------+ RIGHT    CompressibilityPhasicitySpontaneityPropertiesThrombus Aging      +---------+---------------+---------+-----------+----------+-------------------+ CFV      Full  Yes      Yes                                      +---------+---------------+---------+-----------+----------+-------------------+ SFJ      Full                                                             +---------+---------------+---------+-----------+----------+-------------------+ FV Prox  Full                                                             +---------+---------------+---------+-----------+----------+-------------------+ FV Mid   Full                                                             +---------+---------------+---------+-----------+----------+-------------------+ FV DistalFull                                                             +---------+---------------+---------+-----------+----------+-------------------+ PFV      Full                                                             +---------+---------------+---------+-----------+----------+-------------------+ POP                     Yes      Yes                  patent by color and                                                       Doppler             +---------+---------------+---------+-----------+----------+-------------------+ PTV      Full                                                             +---------+---------------+---------+-----------+----------+-------------------+ PERO     Full                                                             +---------+---------------+---------+-----------+----------+-------------------+   +---------+---------------+---------+-----------+----------+--------------+  LEFT     CompressibilityPhasicitySpontaneityPropertiesThrombus Aging  +---------+---------------+---------+-----------+----------+--------------+ CFV      Full           Yes      Yes                                 +---------+---------------+---------+-----------+----------+--------------+ SFJ      Full                                                        +---------+---------------+---------+-----------+----------+--------------+ FV Prox  Full                                                        +---------+---------------+---------+-----------+----------+--------------+ FV Mid   Full                                                        +---------+---------------+---------+-----------+----------+--------------+ FV DistalFull                                                        +---------+---------------+---------+-----------+----------+--------------+ PFV      Full                                                        +---------+---------------+---------+-----------+----------+--------------+ POP      Full           Yes      Yes                                 +---------+---------------+---------+-----------+----------+--------------+ PTV      Full                                                        +---------+---------------+---------+-----------+----------+--------------+ PERO     Full                                                        +---------+---------------+---------+-----------+----------+--------------+     Summary: BILATERAL: - No evidence of deep vein thrombosis seen in the lower extremities, bilaterally. -No evidence of popliteal cyst, bilaterally.   *See table(s) above for measurements and observations. Electronically signed by Deitra Mayo MD on 03/21/2022 at 8:20:57 PM.  Final    DG Chest Port 1 View  Result Date: 03/21/2022 CLINICAL DATA:  Intubation.  Respiratory distress. EXAM: PORTABLE CHEST 1 VIEW COMPARISON:  Chest radiograph dated 03/20/2022. FINDINGS: Endotracheal tube with  tip approximately 4 cm above the carina. Enteric tube extends below the diaphragm with tip beyond the inferior margin of the image. Improved aeration of the left lower lobe since the earlier radiograph. Minimal right base atelectasis. No pleural effusion or pneumothorax. The cardiac silhouette is within limits. No acute osseous pathology. IMPRESSION: 1. Endotracheal tube above the carina. 2. Improved aeration of the left lower lobe. Electronically Signed   By: Anner Crete M.D.   On: 03/21/2022 01:10   ECHOCARDIOGRAM COMPLETE  Result Date: 03/20/2022    ECHOCARDIOGRAM REPORT   Patient Name:   GERIKA BUNTING Date of Exam: 03/20/2022 Medical Rec #:  [id number]        Height:       67.0 in Accession #:    [id number]       Weight:       162.9 lb Date of Birth:  2001/01/09         BSA:          1.853 m Patient Age:    22 years         BP:           100/52 mmHg Patient Gender: F                HR:           63 bpm. Exam Location:  Inpatient Procedure: 2D Echo, Cardiac Doppler, Color Doppler and Strain Analysis Indications:    Stroke I63.9  History:        Patient has no prior history of Echocardiogram examinations.  Sonographer:    [place] Referring Phys: [id number] [facility]  Sonographer Comments: Echo performed with patient supine and on artificial respirator. IMPRESSIONS  1. Left ventricular ejection fraction, by estimation, is 55 to 60%. The left ventricle has normal function. The left ventricle has no regional wall motion abnormalities. Left ventricular diastolic parameters were normal. The average left ventricular global longitudinal strain is -25.9 %. The global longitudinal strain is normal.  2. Right ventricular systolic function is normal. The right ventricular size is normal. There is normal pulmonary artery systolic pressure.  3. The mitral valve is normal in structure. No evidence of mitral valve regurgitation. No evidence of mitral stenosis.  4. The aortic valve is tricuspid. Aortic valve  regurgitation is not visualized. No aortic stenosis is present.  5. The inferior vena cava is normal in size with greater than 50% respiratory variability, suggesting right atrial pressure of 3 mmHg. FINDINGS  Left Ventricle: Left ventricular ejection fraction, by estimation, is 55 to 60%. The left ventricle has normal function. The left ventricle has no regional wall motion abnormalities. The average left ventricular global longitudinal strain is -25.9 %. The global longitudinal strain is normal. The left ventricular internal cavity size was normal in size. There is no left ventricular hypertrophy. Left ventricular diastolic parameters were normal. Right Ventricle: The right ventricular size is normal. No increase in right ventricular wall thickness. Right ventricular systolic function is normal. There is normal pulmonary artery systolic pressure. The tricuspid regurgitant velocity is 2.35 m/s, and  with an assumed right atrial pressure of 8 mmHg, the estimated right ventricular systolic pressure is 0000000 mmHg. Left Atrium: Left atrial size was normal in size. Right Atrium: Right atrial size was  normal in size. Pericardium: There is no evidence of pericardial effusion. Mitral Valve: The mitral valve is normal in structure. No evidence of mitral valve regurgitation. No evidence of mitral valve stenosis. Tricuspid Valve: The tricuspid valve is normal in structure. Tricuspid valve regurgitation is mild . No evidence of tricuspid stenosis. Aortic Valve: The aortic valve is tricuspid. Aortic valve regurgitation is not visualized. No aortic stenosis is present. Pulmonic Valve: The pulmonic valve was normal in structure. Pulmonic valve regurgitation is not visualized. No evidence of pulmonic stenosis. Aorta: The aortic root is normal in size and structure. Venous: The inferior vena cava is normal in size with greater than 50% respiratory variability, suggesting right atrial pressure of 3 mmHg. IAS/Shunts: No atrial level  shunt detected by color flow Doppler.  LEFT VENTRICLE PLAX 2D LVIDd:         4.70 cm   Diastology LVIDs:         2.60 cm   LV e' medial:    9.25 cm/s LV PW:         1.10 cm   LV E/e' medial:  8.8 LV IVS:        1.00 cm   LV e' lateral:   19.60 cm/s LVOT diam:     2.30 cm   LV E/e' lateral: 4.1 LV SV:         85 LV SV Index:   46        2D Longitudinal Strain LVOT Area:     4.15 cm  2D Strain GLS Avg:     -25.9 %  RIGHT VENTRICLE             IVC RV S prime:     12.60 cm/s  IVC diam: 2.00 cm TAPSE (M-mode): 2.2 cm LEFT ATRIUM             Index        RIGHT ATRIUM           Index LA diam:        3.40 cm 1.83 cm/m   RA Area:     14.90 cm LA Vol (A2C):   47.5 ml 25.63 ml/m  RA Volume:   38.50 ml  20.77 ml/m LA Vol (A4C):   29.8 ml 16.08 ml/m LA Biplane Vol: 39.9 ml 21.53 ml/m  AORTIC VALVE LVOT Vmax:   97.45 cm/s LVOT Vmean:  62.650 cm/s LVOT VTI:    0.206 m  AORTA Ao Root diam: 2.70 cm Ao Asc diam:  2.30 cm Ao Desc diam: 2.00 cm MITRAL VALVE               TRICUSPID VALVE MV Area (PHT): 2.91 cm    TR Peak grad:   22.1 mmHg MV Decel Time: 261 msec    TR Vmax:        235.00 cm/s MV E velocity: 81.20 cm/s MV A velocity: 52.70 cm/s  SHUNTS MV E/A ratio:  1.54        Systemic VTI:  0.21 m                            Systemic Diam: 2.30 cm Jenkins Rouge MD Electronically signed by Jenkins Rouge MD Signature Date/Time: 03/20/2022/11:01:55 AM    Final    DG CHEST PORT 1 VIEW  Result Date: 03/20/2022 CLINICAL DATA:  Shortness of breath EXAM: PORTABLE CHEST 1 VIEW COMPARISON:  03/28/2022 FINDINGS: Endotracheal tube with tip between the  clavicular heads and carina. An enteric tube reaches the stomach. Artifact from EKG leads. Linear opacity in the right mid lung. Dense opacity with volume loss at the left base. No pneumothorax IMPRESSION: 1. Worsening left lower lobe aeration with volume loss. Mild atelectasis on the right. 2. Unremarkable hardware positioning. Electronically Signed   By: Jorje Guild M.D.   On:  03/20/2022 06:42   MR CERVICAL SPINE W WO CONTRAST  Result Date: 03/20/2022 CLINICAL DATA:  Myelopathy EXAM: MRI CERVICAL SPINE WITHOUT AND WITH CONTRAST TECHNIQUE: Multiplanar and multiecho pulse sequences of the cervical spine, to include the craniocervical junction and cervicothoracic junction, were obtained without and with intravenous contrast. CONTRAST:  7.5 mL Gadavist COMPARISON:  No prior MRI of the cervical spine, correlation is made with same day MRI head and CT cervical spine FINDINGS: Alignment: No listhesis. Vertebrae: No fracture, evidence of discitis, or bone lesion. Cord: Restricted diffusion with ADC correlate along the bilateral anterior aspect of the spinal cord from the craniocervical junction (series 6, image 93) to the level of C7 (series 6, image 56). This correlates with increased T2 hyperintense signal, most likely in the anterior horn cells. Spinal cord is otherwise normal in caliber and signal. No abnormal enhancement. Posterior Fossa, vertebral arteries, paraspinal tissues: Negative. Disc levels: Preserved disc height and disc hydration. No spinal canal stenosis or neural foraminal narrowing. 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 These findings were discussed by telephone on 03/20/2022 at 3:30 is am with provider [facility] . Electronically Signed   By: Merilyn Baba M.D.   On: 03/20/2022 03:54   MR BRAIN WO CONTRAST  Result Date: 03/20/2022 CLINICAL DATA:  Altered mental status, stroke suspected EXAM: MRI HEAD WITHOUT CONTRAST TECHNIQUE: Multiplanar, multiecho pulse sequences of the brain and surrounding structures were obtained without intravenous contrast. COMPARISON:  None Available. FINDINGS: Brain: Restricted diffusion with ADC correlate in the anteromedial cervical spinal  cord (series 5, image 54) at the craniocervical junction. No other restricted diffusion to suggest acute or subacute infarct. No acute hemorrhage, mass, mass effect, or midline shift. No hydrocephalus or extra-axial collection. Normal pituitary and craniocervical junction. No hemosiderin deposition to suggest remote hemorrhage. Normal cerebral volume for age. Vascular: Normal arterial flow voids. Skull and upper cervical spine: Normal marrow signal. Sinuses/Orbits: Clear paranasal sinuses. No acute finding in the orbits. Other: Trace fluid in the right mastoid air cells. IMPRESSION: 1. 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 Attention on subsequent MRI cervical spine. 2. No other acute intracranial process. These findings were discussed by telephone on 03/20/2022 at 3:05 am with provider [facility] . Electronically Signed   By: Merilyn Baba M.D.   On: 03/20/2022 03:14   CT C-SPINE NO CHARGE  Result Date: 03/20/2022 CLINICAL DATA:  Stroke code, concern for cervical pathology EXAM: CT CERVICAL SPINE WITHOUT CONTRAST TECHNIQUE: Multidetector CT imaging of the cervical spine was performed without intravenous contrast. Multiplanar CT image reconstructions were also generated. RADIATION DOSE REDUCTION: This exam was performed according to the departmental dose-optimization program which includes automated exposure control, adjustment of the mA and/or kV according to patient size and/or use of iterative reconstruction technique. COMPARISON:  None Available. FINDINGS: Alignment: No listhesis. Straightening of the normal cervical lordosis, which may  be positional. Skull base and vertebrae: No acute fracture. No primary bone lesion or focal pathologic process. Soft tissues and spinal canal: No prevertebral fluid or swelling. No visible canal hematoma. Disc levels: Disc heights are preserved. No  significant degenerative changes. No significant spinal canal stenosis or neural foraminal narrowing. Upper chest: No focal pulmonary opacity or pleural effusion. Other: Endotracheal and orogastric tubes noted. IMPRESSION: No acute or chronic abnormality in the cervical spine. Electronically Signed   By: Merilyn Baba M.D.   On: 03/20/2022 03:00   CT ANGIO HEAD NECK W WO CM W PERF (CODE STROKE)  Result Date: 03/20/2022 CLINICAL DATA:  Stroke suspected, extremity numbness and weakness EXAM: CT ANGIOGRAPHY HEAD AND NECK CT PERFUSION TECHNIQUE: Multidetector CT imaging of the head and neck was performed using the standard protocol during bolus administration of intravenous contrast. Multiplanar CT image reconstructions and MIPs were obtained to evaluate the vascular anatomy. Carotid stenosis measurements (when applicable) are obtained utilizing NASCET criteria, using the distal internal carotid diameter as the denominator. Multiphase CT imaging of the brain was performed following IV bolus contrast injection. Subsequent parametric perfusion maps were calculated using RAPID software. RADIATION DOSE REDUCTION: This exam was performed according to the departmental dose-optimization program which includes automated exposure control, adjustment of the mA and/or kV according to patient size and/or use of iterative reconstruction technique. CONTRAST:  80 mL Omnipaque 300 COMPARISON:  No prior CTA, correlation is made with CT head 03/20/2022 FINDINGS: CT HEAD FINDINGS For noncontrast findings, please see same day CT head. CTA NECK FINDINGS Aortic arch: Two-vessel arch with a common origin of the brachiocephalic and left common carotid arteries. Imaged portion shows no evidence of aneurysm or dissection. No significant stenosis of the major arch vessel origins. Right carotid system: No evidence of stenosis, dissection, or occlusion. Left carotid system: No evidence of stenosis, dissection, or occlusion. Vertebral arteries:  No evidence of stenosis, dissection, or occlusion. Skeleton: No acute osseous abnormality.  No visible canal hematoma. Other neck: Endotracheal and orogastric tubes noted. Otherwise negative. Upper chest: Negative. Review of the MIP images confirms the above findings CTA HEAD FINDINGS Anterior circulation: Both internal carotid arteries are patent to the termini, without significant stenosis. A1 segments patent. Normal anterior communicating artery. Anterior cerebral arteries are patent to their distal aspects. No M1 stenosis or occlusion. MCA branches perfused and symmetric. Posterior circulation: Vertebral arteries patent to the vertebrobasilar junction without stenosis. Posterior inferior cerebellar arteries patent proximally. Basilar patent to its distal aspect. Superior cerebellar arteries patent proximally. Patent P1 segments. PCAs perfused to their distal aspects without stenosis. The bilateral posterior communicating arteries are diminutive but patent. Venous sinuses: As permitted by contrast timing, patent. Anatomic variants: None significant. Review of the MIP images confirms the above findings CT Brain Perfusion Findings: ASPECTS: 10 CBF (<30%) Volume: 61m Perfusion (Tmax>6.0s) volume: 063mMismatch Volume: 105m2105mnfarction Location:None IMPRESSION: 1. No intracranial large vessel occlusion or significant stenosis. 2. No hemodynamically significant stenosis in the neck. 3. No evidence of infarct core or penumbra on CT perfusion. Electronically Signed   By: AliMerilyn BabaD.   On: 03/20/2022 02:43   CT HEAD CODE STROKE WO CONTRAST  Result Date: 03/20/2022 CLINICAL DATA:  Code stroke. EXAM: CT HEAD WITHOUT CONTRAST TECHNIQUE: Contiguous axial images were obtained from the base of the skull through the vertex without intravenous contrast. RADIATION DOSE REDUCTION: This exam was performed according to the departmental dose-optimization program which includes automated exposure control, adjustment of the mA  and/or  kV according to patient size and/or use of iterative reconstruction technique. COMPARISON:  03/20/2022 FINDINGS: Brain: No evidence of acute infarction, hemorrhage, mass, mass effect, or midline shift. No hydrocephalus or extra-axial collection. Vascular: No hyperdense vessel. Skull: Negative for fracture or focal lesion. Sinuses/Orbits: No acute finding. Other: The mastoid air cells are well aerated. ASPECTS [facility] St[facility] Early CT Score) - Ganglionic level infarction (caudate, lentiform nuclei, internal capsule, insula, M1-M3 cortex): 7 - Supraganglionic infarction (M4-M6 cortex): 3 Total score (0-10 with 10 being normal): 10 IMPRESSION: No evidence of acute intracranial abnormality. ASPECTS is 10. Imaging results were communicated on 03/20/2022 at 2:19 am to provider Dr. Lorrin Goodell via secure text paging. Electronically Signed   By: Merilyn Baba M.D.   On: 03/20/2022 02:19   DG CHEST PORT 1 VIEW  Result Date: 03/20/2022 CLINICAL DATA:  Check gastric catheter placement EXAM: PORTABLE CHEST 1 VIEW COMPARISON:  Film from earlier in the same day. FINDINGS: Endotracheal tube is noted in satisfactory position. Gastric catheter extends into the stomach. Lungs are well aerated bilaterally. Mild left basilar atelectasis and small effusion is noted. No other focal abnormality is seen. IMPRESSION: Tubes and lines in satisfactory position. Mild left basilar atelectasis. Electronically Signed   By: Inez Catalina M.D.   On: 03/20/2022 00:12   DG Chest Portable 1 View  Result Date: 03/17/2022 CLINICAL DATA:  OG tube placement EXAM: PORTABLE CHEST 1 VIEW COMPARISON:  03/16/2022, 8:44 p.m. FINDINGS: Similar position of endotracheal tube, positioned over the midtrachea. Similar position of esophagogastric tube, malpositioned, tip over the trachea. Unchanged elevation of the left hemidiaphragm. Heart and mediastinum unremarkable. Osseous structures unremarkable. IMPRESSION: 1. Similar position of  esophagogastric tube, malpositioned, tip over the trachea. Recommend repositioning to ensure positioned within the esophagus. 2. Similar position of endotracheal tube, positioned over the midtrachea. These results will be called to the ordering clinician or representative by the Radiologist Assistant, and communication documented in the PACS or [employer]. Electronically Signed   By: Delanna Ahmadi M.D.   On: 03/18/2022 21:33   DG Chest Portable 1 View  Result Date: 03/22/2022 CLINICAL DATA:  Post intubation. EXAM: PORTABLE CHEST 1 VIEW COMPARISON:  03/23/2022. FINDINGS: Heart is enlarged and the mediastinal contour is within normal limits. No consolidation, effusion, or pneumothorax. A soft tissue density is present over the left lung field, likely reflecting breast tissue. The endotracheal tube terminates 5.1 cm above the carina. An enteric tube terminates in the mid esophagus and should be advanced approximately 26 cm. IMPRESSION: 1. No active disease. 2. An enteric tube terminates in the proximal to mid esophagus and should be advanced 26 cm. 3. Endotracheal tube terminates 5.1 cm above the carina. Electronically Signed   By: Brett Fairy M.D.   On: 03/28/2022 20:57   DG Chest Portable 1 View  Result Date: 03/10/2022 CLINICAL DATA:  Cough weakness EXAM: PORTABLE CHEST 1 VIEW COMPARISON:  None Available. FINDINGS: Hazy opacity at the left base. Right lung is clear. Normal cardiac size. No pneumothorax IMPRESSION: Hazy opacity at the left base may reflect atelectasis or mild pneumonia. Electronically Signed   By: Donavan Foil M.D.   On: 03/27/2022 18:56   CT HEAD WO CONTRAST  Result Date: 03/22/2022 CLINICAL DATA:  Weakness headache unable to move arms and legs EXAM: CT HEAD WITHOUT CONTRAST TECHNIQUE: Contiguous axial images were obtained from the base of the skull through the vertex without intravenous contrast. RADIATION DOSE REDUCTION: This exam was performed according to the departmental  dose-optimization program which includes automated exposure control, adjustment of the mA and/or kV according to patient size and/or use of iterative reconstruction technique. COMPARISON:  None Available. FINDINGS: Brain: No evidence of acute infarction, hemorrhage, hydrocephalus, extra-axial collection or mass lesion/mass effect. Vascular: No hyperdense vessel or unexpected calcification. Skull: Normal. Negative for fracture or focal lesion. Sinuses/Orbits: No acute finding. Other: None IMPRESSION: Negative non contrasted CT appearance of the brain. Electronically Signed   By: Donavan Foil M.D.   On: 03/22/2022 18:55    Labs:  CBC: Recent Labs    03/20/22 1010 03/21/22 0127 03/21/22 2255 03/22/22 0154 03/22/22 0611 03/23/22 0417  WBC 13.5*  --   --  20.0* 20.3* 15.0*  HGB 11.1*   < > 11.6* 12.0 11.2* 10.2*  HCT 34.9*   < > 34.0* 37.0 34.4* 31.2*  PLT 221  --   --  177 193 169   < > = values in this interval not displayed.    COAGS: Recent Labs    03/14/2022 1825  INR 1.0  APTT 30    BMP: Recent Labs    03/22/22 0611 03/22/22 2350 03/23/22 0550 03/24/22 0447  NA 139 140 142 139  K 3.6 3.2* 3.7 3.5  CL 106 109 105 106  CO2 25 26 25 24  $ GLUCOSE 146* 135* 116* 96  BUN 9 7 8 9  $ CALCIUM 8.6* 7.9* 8.6* 8.1*  CREATININE 0.62 0.63 0.68 0.59  GFRNONAA >60 >60 >60 >60    LIVER FUNCTION TESTS: Recent Labs    03/23/2022 1825  BILITOT 0.6  AST 14*  ALT 14  ALKPHOS 74  PROT 7.6  ALBUMIN 4.6    TUMOR MARKERS: No results for input(s): "AFPTM", "CEA", "CA199", "CHROMGRNA" in the last 8760 hours.  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  She is currently receiving nutrition support via Cortrak.  Gastrostomy tube requested for long-term care.   Per chart review, patient is aware of plans for G-tube and is agreeable.  Mother  requests she not be disturbed today and team revisit tomorrow for consent.   Patient does consent for herself.   NPO p MN.  Hold lovenox tomorrow.  Aspirin 24m given 2/17, 18, 19, 20.  Held today, hold tomorrow. Reviewed with Dr. SAnnamaria Bootswho agrees with plan.   WBC elevated on admission.  Likely asp PNA, on abx. Recheck CBC and INR tomorrow for possible procedure as schedule allows and if patient consents.    Thank you for this interesting consult.  I greatly enjoyed meeting MMelane Bolland look forward to participating in their care.  A copy of this report was sent to the requesting provider on this date.  Electronically Signed: KDocia Barrier PA 03/24/2022, 3:12 PM   I spent a total of 20 Minutes   in face to face in clinical consultation, greater than 50% of which was counseling/coordinating care for spinal cord infarct.

## 2022-03-25 ENCOUNTER — Inpatient Hospital Stay (HOSPITAL_COMMUNITY): Payer: Medicaid Other

## 2022-03-25 DIAGNOSIS — R4182 Altered mental status, unspecified: Secondary | ICD-10-CM

## 2022-03-25 DIAGNOSIS — Z66 Do not resuscitate: Secondary | ICD-10-CM | POA: Diagnosis not present

## 2022-03-25 DIAGNOSIS — Z7189 Other specified counseling: Secondary | ICD-10-CM | POA: Diagnosis not present

## 2022-03-25 DIAGNOSIS — E8729 Other acidosis: Secondary | ICD-10-CM | POA: Diagnosis not present

## 2022-03-25 DIAGNOSIS — T68XXXA Hypothermia, initial encounter: Secondary | ICD-10-CM | POA: Diagnosis not present

## 2022-03-25 DIAGNOSIS — J189 Pneumonia, unspecified organism: Secondary | ICD-10-CM | POA: Diagnosis not present

## 2022-03-25 DIAGNOSIS — Z515 Encounter for palliative care: Secondary | ICD-10-CM | POA: Diagnosis not present

## 2022-03-25 DIAGNOSIS — I6389 Other cerebral infarction: Secondary | ICD-10-CM | POA: Diagnosis not present

## 2022-03-25 LAB — GLUCOSE, CAPILLARY
Glucose-Capillary: 102 mg/dL — ABNORMAL HIGH (ref 70–99)
Glucose-Capillary: 102 mg/dL — ABNORMAL HIGH (ref 70–99)
Glucose-Capillary: 103 mg/dL — ABNORMAL HIGH (ref 70–99)
Glucose-Capillary: 108 mg/dL — ABNORMAL HIGH (ref 70–99)
Glucose-Capillary: 116 mg/dL — ABNORMAL HIGH (ref 70–99)
Glucose-Capillary: 89 mg/dL (ref 70–99)
Glucose-Capillary: 98 mg/dL (ref 70–99)
Glucose-Capillary: 98 mg/dL (ref 70–99)

## 2022-03-25 LAB — CBC
HCT: 27.3 % — ABNORMAL LOW (ref 36.0–46.0)
Hemoglobin: 9 g/dL — ABNORMAL LOW (ref 12.0–15.0)
MCH: 28.6 pg (ref 26.0–34.0)
MCHC: 33 g/dL (ref 30.0–36.0)
MCV: 86.7 fL (ref 80.0–100.0)
Platelets: 203 10*3/uL (ref 150–400)
RBC: 3.15 MIL/uL — ABNORMAL LOW (ref 3.87–5.11)
RDW: 15.2 % (ref 11.5–15.5)
WBC: 11.7 10*3/uL — ABNORMAL HIGH (ref 4.0–10.5)
nRBC: 0 % (ref 0.0–0.2)

## 2022-03-25 LAB — BASIC METABOLIC PANEL
Anion gap: 15 (ref 5–15)
BUN: 10 mg/dL (ref 6–20)
CO2: 24 mmol/L (ref 22–32)
Calcium: 6.7 mg/dL — ABNORMAL LOW (ref 8.9–10.3)
Chloride: 110 mmol/L (ref 98–111)
Creatinine, Ser: 1.74 mg/dL — ABNORMAL HIGH (ref 0.44–1.00)
GFR, Estimated: 42 mL/min — ABNORMAL LOW (ref >60)
Glucose, Bld: 91 mg/dL (ref 70–99)
Potassium: 3.1 mmol/L — ABNORMAL LOW (ref 3.5–5.1)
Sodium: 149 mmol/L — ABNORMAL HIGH (ref 135–145)

## 2022-03-25 LAB — PROTIME-INR
INR: 1.1 (ref 0.8–1.2)
Prothrombin Time: 14.4 seconds (ref 11.4–15.2)

## 2022-03-25 LAB — PROTEIN C, TOTAL: Protein C, Total: 119 % (ref 60–150)

## 2022-03-25 MED ORDER — POTASSIUM CHLORIDE 10 MEQ/50ML IV SOLN
10.0000 meq | INTRAVENOUS | Status: AC
  Administered 2022-03-25 (×4): 10 meq via INTRAVENOUS
  Filled 2022-03-25 (×4): qty 50

## 2022-03-25 MED ORDER — ASPIRIN 300 MG RE SUPP: 300.0000 mg | Freq: Every day | RECTAL | Status: DC

## 2022-03-25 MED ORDER — VITAL 1.5 CAL PO LIQD
1000.0000 mL | ORAL | Status: DC
  Administered 2022-03-25: 1000 mL
  Filled 2022-03-25: qty 1000

## 2022-03-25 MED ORDER — POTASSIUM CHLORIDE 20 MEQ PO PACK
40.0000 meq | PACK | Freq: Once | ORAL | Status: AC
  Administered 2022-03-25: 40 meq
  Filled 2022-03-25: qty 2

## 2022-03-25 MED ORDER — SODIUM CHLORIDE 0.45 % IV SOLN: INTRAVENOUS | Status: AC

## 2022-03-25 MED ORDER — CEFAZOLIN SODIUM-DEXTROSE 2-4 GM/100ML-% IV SOLN
2.0000 g | Freq: Once | INTRAVENOUS | Status: DC
  Filled 2022-03-25: qty 100

## 2022-03-25 MED ORDER — ASPIRIN 81 MG PO CHEW
81.0000 mg | CHEWABLE_TABLET | Freq: Every day | ORAL | Status: DC
  Administered 2022-03-27 – 2022-04-14 (×19): 81 mg
  Filled 2022-03-25 (×22): qty 1

## 2022-03-25 MED ORDER — CALCIUM GLUCONATE-NACL 2-0.675 GM/100ML-% IV SOLN
2.0000 g | Freq: Once | INTRAVENOUS | Status: AC
  Administered 2022-03-25: 2000 mg via INTRAVENOUS
  Filled 2022-03-25: qty 100

## 2022-03-25 NOTE — Progress Notes (Signed)
eLink Physician-Brief Progress Note Patient Name: Rachael [name] DOB: 14-Sep-2000 MRN: [id number]   Date of Service  03/25/2022  HPI/Events of Note  Creatinine 1.74, up from 0.59, patient got  40 mg of Lasix at 4 pm yesterday and has put out over a liter of urine over the course of the night shift, K+ is also low at 3.1, Calcium 6.7 gm / dl.  eICU Interventions  Will give her 500 cc of fluid back (0.45 NS at 100 ml/ hour x 5 hours), Will replace K+ per modified electrolyte replacement protocol, will calculate corrected calcium and replace as appropriate.        [name] 03/25/2022, 5:22 AM

## 2022-03-25 NOTE — Progress Notes (Signed)
Daily Progress Note   Patient Name: Rachael [name]       Date: 03/25/2022 DOB: 02/14/2000  Age: 22 y.o. MRN#: [id number] Attending Physician: Candee Furbish, MD Primary Care Physician: Patient, No Pcp Per Admit Date: 03/05/2022 Length of Stay: 6 days  Reason for Consultation/Follow-up: Establishing goals of care  HPI/Patient Profile:  22 y.o. female  with past medical history of depression and suicidal ideation who presented to [place] ED with sudden onset of shortness of breath and upper and lower extremity weakness. ED work-up found loss of speaking ability and hypercarbic respiratory failure and was subsequently intubated and transferred to [facility].  She was admitted on 03/10/2022 with acute flaccid paralysis due to anterior spinal cord infarction, acute hypercapnic and hypoxic respiratory failure due to neuromuscular weakness, aspiration pneumonia, hypothermia, shock likely neurogenic and others.    PMT was consulted for [place] conversations.  Subjective:   Subjective: Chart Reviewed. Updates received. Patient Assessed. Created space and opportunity for patient  and family to explore thoughts and feelings regarding current medical situation.  Today's Discussion: Today met with the patient at the bedside.  Her mother was also present.  The patient is very sleepy.  She states she had a rough night last night, did not get much sleep.  She had a panic attack and medications seem to help a little bit but she still did not sleep well.  When I entered the room IR was just completing her visit and they are planning to place a PEG tube today.  I was able to answer some questions for the patient about sedation and to reassure her that she would not be waiting for the procedure.  We again discussed plan to give it a little bit of time to see if any improvement occurs in the near future and consider allowing more time depending on how she is feeling about her situation.  She also notes that trials  today with the Passy-Muir valve did not go well.  She was struggling and felt like she could not breathe and it really wore her out.  I explained, again, that this is a process and it would take time to get more used to the Passy-Muir valve.  However, at any point she is not wanting to do the trial she is free to refuse.  Because she was very sleepy, I told her I would let her be to get some rest.  I informed her again that palliative is available over the weekend if needed, otherwise I will follow-up on Monday.  I provided emotional and general support through therapeutic listening, empathy, sharing of stories, therapeutic touch, and other techniques. I answered all questions and addressed all concerns to the best of my ability.  Review of Systems  Constitutional:  Positive for fatigue.       Denies pain in general  Respiratory:  Negative for shortness of breath.   Gastrointestinal:  Negative for abdominal pain, nausea and vomiting.    Objective:   Vital Signs:  BP 123/65   Pulse 83   Temp (!) 100.8 F (38.2 C)   Resp (!) 26   Ht 5' 7"$  (1.702 m)   Wt 100 kg   SpO2 96%   BMI 34.53 kg/m   Physical Exam: Physical Exam  Palliative Assessment/Data: 10-30%    Existing Vynca/ACP Documentation: None  Assessment & Plan:   Impression: Present on Admission:  Altered mental state  SUMMARY OF RECOMMENDATIONS   DNR Continue full scope of care  otherwise Goal of attempting to get some form of improvement Allow time for outcomes Tentatively plan for another GOC discussion in about 2 weeks to determine whether to keep going PMT will continue to follow I will check back in Monday Please contact PMT for any significant changes or palliative needs over the weekend  Symptom Management:  Per primary team PMT will continue to follow  Code Status: DNR  Prognosis: Unable to determine  Discharge Planning: To Be Determined  Discussed with: Patient, family, medical team, nursing  team  Thank you for allowing [place] to participate in the care of Kaori Dethomas PMT will continue to support holistically.  Time Total: 60 min  Visit consisted of counseling and education dealing with the complex and emotionally intense issues of symptom management and palliative care in the setting of serious and potentially life-threatening illness. Greater than 50%  of this time was spent counseling and coordinating care related to the above assessment and plan.  Walden Field, NP Palliative Medicine Team  Team Phone # [phone] (Nights/Weekends)  09/30/2020, 8:17 AM

## 2022-03-25 NOTE — Progress Notes (Signed)
NAME:  Rachael [name], MRN:  [id number], DOB:  [birth year], LOS: 6 ADMISSION DATE:  03/13/2022, CONSULTATION DATE:  03/25/22 REFERRING MD:  drawbridge, CHIEF COMPLAINT:  shortness of breath and weakness   History of Present Illness:  Rachael [name] is a 22 y.o. F with history of depression and suicidal ideation who presented to [facility] with sudden onset of shortness of breath and upper and lower extremity weakness.  History taken from Epic as patient is intubated and no family is at the bedside.  She initially had a headache and back pain prior to onset but denied abdominal pain or chest pain.  Vitals showed an initial temp of 81F, bloodwork with WBC 18k, UDS + for bezos and THC.   Her symptoms progressed to the point she wasn't speaking and ABG showed hypercarbic respiratory failure so pt was intubated.  She had some bradycardia and hypotension post-intubation, so was started on peripheral Levophed and given atropine during transport   Pertinent  Medical History   has a past medical history of Depression, Hyperlipidemia, and Kidney abscess.   Significant Hospital Events: Including procedures, antibiotic start and stop dates in addition to other pertinent events   2/16 presented to [facility] with all weakness and shortness of breath requiring intubation and transfer to [facility] 2/18 episodes of desaturations and hypercarbia unexplained 2/19 trach, cortrak, PICC  Interim History / Subjective:  Had some issues with anxiety overnight along with slight desaturation. Improved and stable on vent. This AM she appears comfortable.  Objective   Blood pressure 126/68, pulse 85, temperature (!) 100.8 F (38.2 C), resp. rate (!) 26, height 5' 7"$  (1.702 m), weight 100 kg, SpO2 96 %.    Vent Mode: PRVC FiO2 (%):  [40 %-50 %] 50 % Set Rate:  [26 bmp] 26 bmp Vt Set:  [490 mL] 490 mL PEEP:  [5 cmH20] 5 cmH20 Plateau Pressure:  [19 cmH20-24 cmH20] 19 cmH20   Intake/Output Summary (Last 24 hours) at  03/25/2022 0730 Last data filed at 03/25/2022 0600 Gross per 24 hour  Intake 1640.38 ml  Output 3215 ml  Net -1574.62 ml    Filed Weights   03/23/22 1400 03/24/22 0500 03/25/22 0500  Weight: 104.7 kg 101.3 kg 100 kg   Physical Exam: General: Adult female, resting in bed, in NAD. Neuro: Awake on vent, mouths words but not able to move extremities. HEENT: [place]/AT. Sclerae anicteric. Trach C/D/I. Cardiovascular: RRR, no M/R/G.  Lungs: Respirations even and unlabored.  Some rhonchi on right, clear left. Abdomen: BS x 4, soft, NT/ND.  Musculoskeletal: No gross deformities, no edema.  Skin: Intact, warm, no rashes.  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 Hypercoagulable workup not th at impressive, IgM glycoprotein slightly elevated, needs repeat in 3 months. Autoimmune workup neg. Acute hypercapnic and hypoxic respiratory failure due to NM weakness - now s/p trach Neurogenic bladder, urinary retention - Neurology following/managing, appreciate the assistance - aspirin, statin, plavix after PEG (PEG planned for today 2/22) - OB resistant to removing implanon, wants to discuss with pt further first as they feel low risk for thromboembolic event - foley,  but will eventually require bladder training -maintain neuroprotective measures -PT, OT as able -SLP to work with speech and swallow with trach -palliative care discussions regarding timelines and code status-- see their note  Acute hypoxic & hypercarbic respiratory failure requiring MV due to NM weakness from C spine stroke -  s/p tracheostomy placement Acute hypercapnia with acute respiratory acidosis Aspiration pneumonia RLL -trach care per protocol -daily SAT & SBT -LTVV -VAP prevention protocol -can remove sutures after 7 days (2/26) -complete 7 days of antibiotics (currently on Unasyn, stop date 2/25) -wea FiO2 as able -CPT & hypertonic saline; anticipate she will require a pulmonary clearance regimen  Shock likely neurogenic - shock resolved - Supportive care  Hypokalemia - being repleted. AKI - bump after lasix 2/21. - Follow BMP.  Polysubstance abuse- tobacco, MJ. Has prescribed opiates and benzos. ETOH abuse  At risk for withdrawals - so far no signs - con't zoloft and clonazepam PRN - vitamins  Loose stools -bowel regimen PRN -anticipate will need a chronic bowel regimen to prevent constipation  Anemia due to critical illness -transfuse for Hb <7 or hemodynamically significant bleeding  Hyperglycemia, mild and controlled w/o insulin -d/c routine accuchecks -goal BG <180  At risk for malnutrition -TF at goal -IR consulted for PEG, plan for today 2/22    Best Practice (right click and "Reselect all SmartList Selections" daily)   Diet/type: tubefeeds DVT prophylaxis: LMWH GI prophylaxis: PPI Lines: Central line Foley:  yes Code Status:  full code>> per discussion with palliative now DNR Last date of multidisciplinary goals of care discussion [ mother updated at bedside 2/21]   Critical care time: 30 min.    Montey Hora, [facility] For pager details, please see AMION or use Epic chat  After 1900, please call [facility] for cross coverage needs 03/25/2022, 7:44 AM

## 2022-03-25 NOTE — Progress Notes (Signed)
Patient information was reviewed prior to arranging for transport for image-guided gastrostomy tube placement. At time of review, the patient had a fever of 38.2C, patient was tachypneic with RR of 26, and HR was 91. Due to patient status, it is necessary to wait for patient to be afebrile for 24 hours before G-tube can be placed. IR service will reevaluate the patient tomorrow for possible G-tube placement.  Lura Em, PA-C 03/25/2022

## 2022-03-25 NOTE — Progress Notes (Signed)
Speech Language Pathology Treatment: [name] Speaking valve  Patient Details Name: Rachael [name] MRN: [id number] DOB: Oct 13, 2000 Today's Date: 03/25/2022 Time: [id number] SLP Time Calculation (min) (ACUTE ONLY): 13 min  Assessment / Plan / Recommendation Clinical Impression  Junice was initially agreeable to PMV inline with ventilator (PRVC settting) with SLP and RT however needed encouragement throughout. She tolerated cuff deflation, no cough, mild oral expectoration. She complained of her throat being dry, difficulty breathing and talking however all vitals were stable and her vocal quality was clear with good intensity. She reports "my mouth hurts because I haven't had anything to eat or drink in 7 days." Requested and was given damp oral swab. Expressed herself in sentences with 100% intelligibility. She verbalized her dislike of the valve and asked if therapist could take it off. Agreeable to another minute with valve and encouragement that is moving her closer to her goals with use of her upper airway. RR 21, Sp02 97% and HR 90.    HPI HPI: Rachael [name] is a 22 y.o. female with PMH significant for depression, HLD, Kidney abscess who presents with acute arms and leg weakness, dyspnea and hypercabia.  Pt was intubated on 2/16.   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      SLP Plan  Continue with current plan of care      Recommendations for follow up therapy are one component of a multi-disciplinary discharge planning process, led by the attending physician.  Recommendations may be updated based on patient status, additional functional criteria and insurance authorization.    Recommendations         Patient may use Passy-Muir Speech Valve: with SLP only PMSV Supervision: Full         Oral Care Recommendations: Oral care QID Follow Up Recommendations:  (TBD) Assistance recommended at  discharge: Frequent or constant Supervision/Assistance SLP Visit Diagnosis: Aphonia (R49.1) Plan: Continue with current plan of care           Houston Siren  03/25/2022, 8:57 AM

## 2022-03-25 NOTE — Progress Notes (Addendum)
STROKE TEAM PROGRESS NOTE   SUBJECTIVE (INTERVAL HISTORY) RN is at the bedside. One family member was sleeping on the couch the entire time. Pt eyes closed but able to open eyes briskly on voice. Pt lying in bed, awake alert, still has cortrak. Developed fever and PEG was postponed. More bilateral neck movement, but still quadriplegia.     OBJECTIVE Temp:  [98.4 F (36.9 C)-101.3 F (38.5 C)] 100.6 F (38.1 C) (02/22 1430) Pulse Rate:  [64-107] 64 (02/22 1516) Cardiac Rhythm: Normal sinus rhythm (02/22 0400) Resp:  [17-26] 26 (02/22 1516) BP: (85-134)/(43-93) 118/71 (02/22 1516) SpO2:  [87 %-100 %] 97 % (02/22 1516) FiO2 (%):  [40 %-50 %] 40 % (02/22 1516) Weight:  [100 kg] 100 kg (02/22 0500)  Recent Labs  Lab 03/24/22 2015 03/25/22 0056 03/25/22 0341 03/25/22 0753 03/25/22 1421  GLUCAP 98 103* 89 98 102*   Recent Labs  Lab 03/21/22 0339 03/21/22 0359 03/22/22 0154 03/22/22 0611 03/22/22 2350 03/23/22 0550 03/24/22 0447 03/25/22 0332  NA 138   < >  --  139 140 142 139 149*  K 4.4   < >  --  3.6 3.2* 3.7 3.5 3.1*  CL 109  --   --  106 109 105 106 110  CO2 20*  --   --  25 26 25 24 24  $ GLUCOSE 103*  --   --  146* 135* 116* 96 91  BUN 7  --   --  9 7 8 9 10  $ CREATININE 0.80  --   --  0.62 0.63 0.68 0.59 1.74*  CALCIUM 8.4*  --   --  8.6* 7.9* 8.6* 8.1* 6.7*  MG 2.3  --  2.2  --  1.9 2.8* 2.2  --   PHOS 3.4  --  1.4*  --  2.6 2.7 2.7  --    < > = values in this interval not displayed.   Recent Labs  Lab 03/12/2022 1825  AST 14*  ALT 14  ALKPHOS 74  BILITOT 0.6  PROT 7.6  ALBUMIN 4.6   Recent Labs  Lab 03/31/2022 1825 03/27/2022 1919 03/20/22 1010 03/21/22 0127 03/21/22 2255 03/22/22 0154 03/22/22 0611 03/23/22 0417 03/25/22 0332  WBC 18.3*  --  13.5*  --   --  20.0* 20.3* 15.0* 11.7*  NEUTROABS 15.0*  --   --   --   --   --   --   --   --   HGB 13.8   < > 11.1*   < > 11.6* 12.0 11.2* 10.2* 9.0*  HCT 41.5   < > 34.9*   < > 34.0* 37.0 34.4* 31.2* 27.3*   MCV 84.3  --  87.5  --   --  88.7 87.3 89.7 86.7  PLT 299  --  221  --   --  177 193 169 203   < > = values in this interval not displayed.   No results for input(s): "CKTOTAL", "CKMB", "CKMBINDEX", "TROPONINI" in the last 168 hours. Recent Labs    03/25/22 0332  LABPROT 14.4  INR 1.1    No results for input(s): "COLORURINE", "LABSPEC", "PHURINE", "GLUCOSEU", "HGBUR", "BILIRUBINUR", "KETONESUR", "PROTEINUR", "UROBILINOGEN", "NITRITE", "LEUKOCYTESUR" in the last 72 hours.  Invalid input(s): "APPERANCEUR"      Component Value Date/Time   CHOL 176 03/20/2022 1010   TRIG 138 03/20/2022 1010   TRIG 140 03/20/2022 1010   HDL 37 (L) 03/20/2022 1010   CHOLHDL 4.8 03/20/2022 1010  VLDL 28 03/20/2022 1010   LDLCALC 111 (H) 03/20/2022 1010   Lab Results  Component Value Date   HGBA1C 5.3 03/20/2022      Component Value Date/Time   LABOPIA POSITIVE (A) 03/15/2022 1825   COCAINSCRNUR NONE DETECTED 03/09/2022 1825   LABBENZ POSITIVE (A) 03/26/2022 1825   AMPHETMU NONE DETECTED 03/13/2022 1825   THCU POSITIVE (A) 03/21/2022 1825   LABBARB NONE DETECTED 03/25/2022 1825    Recent Labs  Lab 03/15/2022 1825  ETH <10    I have personally reviewed the radiological images below and agree with the radiology interpretations.  DG CHEST PORT 1 VIEW  Result Date: 03/25/2022 CLINICAL DATA:  22 year old female with history of hypoxia. EXAM: PORTABLE CHEST 1 VIEW COMPARISON:  Chest x-ray 03/22/2022. FINDINGS: A tracheostomy tube is in place with tip 5.0 cm above the carina. A feeding tube is seen extending into the abdomen, however, the tip of the feeding tube extends below the lower margin of the image. Lung volumes are low. No consolidative airspace disease. Probable trace right pleural effusion. No left pleural effusion. No pneumothorax. No evidence of pulmonary edema. Heart size is normal. Upper mediastinal contours are within normal limits. IMPRESSION: 1. Support apparatus, as above. 2.  Trace right pleural effusion. Electronically Signed   By: Vinnie Langton M.D.   On: 03/25/2022 07:03   DG Abd Portable 1V  Result Date: 03/24/2022 CLINICAL DATA:  Feeding tube placement EXAM: PORTABLE ABDOMEN - 1 VIEW COMPARISON:  03/22/2022 FINDINGS: Feeding tube tip in the mid stomach. IMPRESSION: Feeding tube in the stomach. Electronically Signed   By: Rolm Baptise M.D.   On: 03/24/2022 15:10   CT ABDOMEN WO CONTRAST  Result Date: 03/23/2022 CLINICAL DATA:  Unintended weight loss EXAM: CT ABDOMEN WITHOUT CONTRAST TECHNIQUE: Multidetector CT imaging of the abdomen was performed following the standard protocol without IV contrast. RADIATION DOSE REDUCTION: This exam was performed according to the departmental dose-optimization program which includes automated exposure control, adjustment of the mA and/or kV according to patient size and/or use of iterative reconstruction technique. COMPARISON:  03/22/2022, 06/21/2017 FINDINGS: Lower chest: Dense consolidation within the lower lobes, left greater than right, favor atelectasis. Trace bilateral pleural fluid. Hepatobiliary: Unremarkable unenhanced appearance of the liver and gallbladder. Pancreas: Unremarkable unenhanced appearance. Spleen: Unremarkable unenhanced appearance. Adrenals/Urinary Tract: No urinary tract calculi or obstructive uropathy within either kidney. The adrenals are unremarkable. Stomach/Bowel: No bowel obstruction or ileus. No bowel wall thickening or inflammatory change. Enteric catheter extends into the gastric lumen, tip within the gastric body. Vascular/Lymphatic: No significant vascular findings are present. No abdominal lymphadenopathy. Other: No free fluid or free intraperitoneal gas. No abdominal wall hernia. Musculoskeletal: No acute or destructive bony lesions. Reconstructed images demonstrate no additional findings. IMPRESSION: 1. Dense bibasilar consolidation, left greater than right, favor atelectasis. 2. Enteric catheter  within the gastric lumen. 3. Otherwise unremarkable unenhanced CT of the abdomen. Electronically Signed   By: Randa Ngo M.D.   On: 03/23/2022 22:32   ECHO TEE  Result Date: 03/23/2022    TRANSESOPHOGEAL ECHO REPORT   Patient Name:   Rachael [name] Date of Exam: 03/23/2022 Medical Rec #:  [id number]        Height:       67.0 in Accession #:    [id number]       Weight:       223.1 lb Date of Birth:  2000-12-17         BSA:  2.118 m Patient Age:    22 years         BP:           134/79 mmHg Patient Gender: F                HR:           65 bpm. Exam Location:  Inpatient Procedure: Transesophageal Echo, Color Doppler, Cardiac Doppler and Saline            Contrast Bubble Study Indications:    Stroke  History:        Patient has prior history of Echocardiogram examinations, most                 recent 03/20/2022. Risk Factors:Dyslipidemia. Opiates, benzos,                 THC use; Nexplanon.  Sonographer:    Eartha Inch Referring Phys: [id number] Margie Billet PROCEDURE: After discussion of the risks and benefits of a TEE, an informed consent was obtained from the patient. The patient was recently extubated. The transesophogeal probe was passed without difficulty through the esophogus of the patient. Imaged were obtained with the patient in a supine position. Sedation performed by performing physician. Patients was under conscious sedation during this procedure. Anesthetic administered: 28mg of Fentanyl, 4.072mof Versed. Image quality was good. The patient's vital signs; including heart rate, blood pressure, and oxygen saturation; remained stable throughout the procedure. The patient developed no complications during the procedure.  IMPRESSIONS  1. Left ventricular ejection fraction, by estimation, is 55 to 60%. The left ventricle has normal function.  2. Right ventricular systolic function is normal. The right ventricular size is normal.  3. No left atrial/left atrial appendage thrombus was detected.  4.  The mitral valve is normal in structure. Trivial mitral valve regurgitation.  5. The aortic valve is tricuspid. Aortic valve regurgitation is trivial.  6. Agitated saline contrast bubble study was negative, with no evidence of any interatrial shunt both at rest and with valsalva. There was one late bubble consistent with a small intrapulmonary shunt. FINDINGS  Left Ventricle: Left ventricular ejection fraction, by estimation, is 55 to 60%. The left ventricle has normal function. The left ventricular internal cavity size was normal in size. Right Ventricle: The right ventricular size is normal. No increase in right ventricular wall thickness. Right ventricular systolic function is normal. Left Atrium: Left atrial size was normal in size. No left atrial/left atrial appendage thrombus was detected. Right Atrium: Right atrial size was normal in size. Pericardium: There is no evidence of pericardial effusion. Mitral Valve: The mitral valve is normal in structure. Trivial mitral valve regurgitation. Tricuspid Valve: The tricuspid valve is normal in structure. Tricuspid valve regurgitation is trivial. Aortic Valve: The aortic valve is tricuspid. Aortic valve regurgitation is trivial. Pulmonic Valve: The pulmonic valve was normal in structure. Pulmonic valve regurgitation is trivial. Aorta: The aortic root is normal in size and structure. IAS/Shunts: No atrial level shunt detected by color flow Doppler. Agitated saline contrast was given intravenously to evaluate for intracardiac shunting. Agitated saline contrast bubble study was negative, with no evidence of any interatrial shunt. Additional Comments: Spectral Doppler performed. HeGwyndolyn KaufmanD Electronically signed by HeGwyndolyn KaufmanD Signature Date/Time: 03/23/2022/12:56:54 PM    Final    DG Abd Portable 1V  Result Date: 03/22/2022 CLINICAL DATA:  Encounter for feeding tube placement. EXAM: PORTABLE ABDOMEN - 1 VIEW COMPARISON:  None Available. FINDINGS: The  tip of the weighted enteric tube is just to the right of midline in the region of the distal stomach or proximal duodenum. There is mild gaseous distention of bowel in the central abdomen. IMPRESSION: Tip of the weighted enteric tube in the distal stomach or proximal duodenum. Electronically Signed   By: Keith Rake M.D.   On: 03/22/2022 13:35   DG Chest Port 1 View  Result Date: 03/22/2022 CLINICAL DATA:  Respiratory failure and status post tracheostomy. EXAM: PORTABLE CHEST 1 VIEW COMPARISON:  Film earlier today at 0758 hours FINDINGS: Stable heart size. Interval tracheostomy and removal of endotracheal tube. The tracheostomy tube appears in appropriate position. Standard gastric decompression tube has been removed and a Dobbhoff feeding tube placed. Relatively stable right lower lobe atelectasis. No pneumothorax. IMPRESSION: Appropriate position of tracheostomy tube. Stable right lower lobe atelectasis. Removal of standard gastric decompression tube and placement of Dobbhoff feeding tube. Electronically Signed   By: Aletta Edouard M.D.   On: 03/22/2022 12:50   [place] EKG SITE RITE  Result Date: 03/22/2022 If Site Rite image not attached, placement could not be confirmed due to current cardiac rhythm.  DG Chest Port 1 View  Result Date: 03/22/2022 CLINICAL DATA:  Respiratory failure EXAM: PORTABLE CHEST 1 VIEW COMPARISON:  Chest radiograph 1 day prior FINDINGS: Endotracheal tube tip is proximally 3.1 cm from the carina. The enteric catheter tip is off field of view The cardiomediastinal silhouette is normal There is a small right pleural effusion which may be slightly increased since prior study. Hazy opacities in the right lower lobe are favored to reflect atelectasis. The previously seen small left pleural effusion appears resolved with improved aeration of the left lower lobe. Aeration is otherwise unchanged. There is no pneumothorax The bones are stable. IMPRESSION: 1. New small right pleural  effusion with probable adjacent atelectasis. 2. Resolved small left pleural effusion with improved aeration of the left lower lobe. Electronically Signed   By: Valetta Mole M.D.   On: 03/22/2022 08:18   VAS [place] LOWER EXTREMITY VENOUS (DVT)  Result Date: 03/21/2022  Lower Venous DVT Study Patient Name:  KARIANNA PACHA  Date of Exam:   03/21/2022 Medical Rec #: [id number]         Accession #:    [id number] Date of Birth: [birth year]          Patient Gender: F Patient Age:   30 years Exam Location:  [facility] Procedure:      VAS [place] LOWER EXTREMITY VENOUS (DVT) Referring Phys: Cornelius Moras Tnya Ades --------------------------------------------------------------------------------  Other Indications: Spinal stroke, paralysis. Limitations: Ventilation, paralysis. Comparison Study: No prior study on file Performing Technologist: Sharion Dove RVS  Examination Guidelines: A complete evaluation includes B-mode imaging, spectral Doppler, color Doppler, and power Doppler as needed of all accessible portions of each vessel. Bilateral testing is considered an integral part of a complete examination. Limited examinations for reoccurring indications may be performed as noted. The reflux portion of the exam is performed with the patient in reverse Trendelenburg.  +---------+---------------+---------+-----------+----------+-------------------+ RIGHT    CompressibilityPhasicitySpontaneityPropertiesThrombus Aging      +---------+---------------+---------+-----------+----------+-------------------+ CFV      Full           Yes      Yes                                      +---------+---------------+---------+-----------+----------+-------------------+ SFJ  Full                                                             +---------+---------------+---------+-----------+----------+-------------------+ FV Prox  Full                                                              +---------+---------------+---------+-----------+----------+-------------------+ FV Mid   Full                                                             +---------+---------------+---------+-----------+----------+-------------------+ FV DistalFull                                                             +---------+---------------+---------+-----------+----------+-------------------+ PFV      Full                                                             +---------+---------------+---------+-----------+----------+-------------------+ POP                     Yes      Yes                  patent by color and                                                       Doppler             +---------+---------------+---------+-----------+----------+-------------------+ PTV      Full                                                             +---------+---------------+---------+-----------+----------+-------------------+ PERO     Full                                                             +---------+---------------+---------+-----------+----------+-------------------+   +---------+---------------+---------+-----------+----------+--------------+ LEFT     CompressibilityPhasicitySpontaneityPropertiesThrombus Aging +---------+---------------+---------+-----------+----------+--------------+ CFV      Full  Yes      Yes                                 +---------+---------------+---------+-----------+----------+--------------+ SFJ      Full                                                        +---------+---------------+---------+-----------+----------+--------------+ FV Prox  Full                                                        +---------+---------------+---------+-----------+----------+--------------+ FV Mid   Full                                                         +---------+---------------+---------+-----------+----------+--------------+ FV DistalFull                                                        +---------+---------------+---------+-----------+----------+--------------+ PFV      Full                                                        +---------+---------------+---------+-----------+----------+--------------+ POP      Full           Yes      Yes                                 +---------+---------------+---------+-----------+----------+--------------+ PTV      Full                                                        +---------+---------------+---------+-----------+----------+--------------+ PERO     Full                                                        +---------+---------------+---------+-----------+----------+--------------+     Summary: BILATERAL: - No evidence of deep vein thrombosis seen in the lower extremities, bilaterally. -No evidence of popliteal cyst, bilaterally.   *See table(s) above for measurements and observations. Electronically signed by Deitra Mayo MD on 03/21/2022 at 8:20:57 PM.    Final    DG Chest Port 1 View  Result Date: 03/21/2022 CLINICAL DATA:  Intubation.  Respiratory distress. EXAM: PORTABLE CHEST 1  VIEW COMPARISON:  Chest radiograph dated 03/20/2022. FINDINGS: Endotracheal tube with tip approximately 4 cm above the carina. Enteric tube extends below the diaphragm with tip beyond the inferior margin of the image. Improved aeration of the left lower lobe since the earlier radiograph. Minimal right base atelectasis. No pleural effusion or pneumothorax. The cardiac silhouette is within limits. No acute osseous pathology. IMPRESSION: 1. Endotracheal tube above the carina. 2. Improved aeration of the left lower lobe. Electronically Signed   By: Anner Crete M.D.   On: 03/21/2022 01:10   ECHOCARDIOGRAM COMPLETE  Result Date: 03/20/2022    ECHOCARDIOGRAM REPORT   Patient Name:    POSIE HANSE Date of Exam: 03/20/2022 Medical Rec #:  [id number]        Height:       67.0 in Accession #:    [id number]       Weight:       162.9 lb Date of Birth:  31-Jul-2000         BSA:          1.853 m Patient Age:    22 years         BP:           100/52 mmHg Patient Gender: F                HR:           63 bpm. Exam Location:  Inpatient Procedure: 2D Echo, Cardiac Doppler, Color Doppler and Strain Analysis Indications:    Stroke I63.9  History:        Patient has no prior history of Echocardiogram examinations.  Sonographer:    [place] Referring Phys: [id number] [facility]  Sonographer Comments: Echo performed with patient supine and on artificial respirator. IMPRESSIONS  1. Left ventricular ejection fraction, by estimation, is 55 to 60%. The left ventricle has normal function. The left ventricle has no regional wall motion abnormalities. Left ventricular diastolic parameters were normal. The average left ventricular global longitudinal strain is -25.9 %. The global longitudinal strain is normal.  2. Right ventricular systolic function is normal. The right ventricular size is normal. There is normal pulmonary artery systolic pressure.  3. The mitral valve is normal in structure. No evidence of mitral valve regurgitation. No evidence of mitral stenosis.  4. The aortic valve is tricuspid. Aortic valve regurgitation is not visualized. No aortic stenosis is present.  5. The inferior vena cava is normal in size with greater than 50% respiratory variability, suggesting right atrial pressure of 3 mmHg. FINDINGS  Left Ventricle: Left ventricular ejection fraction, by estimation, is 55 to 60%. The left ventricle has normal function. The left ventricle has no regional wall motion abnormalities. The average left ventricular global longitudinal strain is -25.9 %. The global longitudinal strain is normal. The left ventricular internal cavity size was normal in size. There is no left ventricular hypertrophy.  Left ventricular diastolic parameters were normal. Right Ventricle: The right ventricular size is normal. No increase in right ventricular wall thickness. Right ventricular systolic function is normal. There is normal pulmonary artery systolic pressure. The tricuspid regurgitant velocity is 2.35 m/s, and  with an assumed right atrial pressure of 8 mmHg, the estimated right ventricular systolic pressure is 0000000 mmHg. Left Atrium: Left atrial size was normal in size. Right Atrium: Right atrial size was normal in size. Pericardium: There is no evidence of pericardial effusion. Mitral Valve: The mitral valve is normal in structure. No evidence of mitral  valve regurgitation. No evidence of mitral valve stenosis. Tricuspid Valve: The tricuspid valve is normal in structure. Tricuspid valve regurgitation is mild . No evidence of tricuspid stenosis. Aortic Valve: The aortic valve is tricuspid. Aortic valve regurgitation is not visualized. No aortic stenosis is present. Pulmonic Valve: The pulmonic valve was normal in structure. Pulmonic valve regurgitation is not visualized. No evidence of pulmonic stenosis. Aorta: The aortic root is normal in size and structure. Venous: The inferior vena cava is normal in size with greater than 50% respiratory variability, suggesting right atrial pressure of 3 mmHg. IAS/Shunts: No atrial level shunt detected by color flow Doppler.  LEFT VENTRICLE PLAX 2D LVIDd:         4.70 cm   Diastology LVIDs:         2.60 cm   LV e' medial:    9.25 cm/s LV PW:         1.10 cm   LV E/e' medial:  8.8 LV IVS:        1.00 cm   LV e' lateral:   19.60 cm/s LVOT diam:     2.30 cm   LV E/e' lateral: 4.1 LV SV:         85 LV SV Index:   46        2D Longitudinal Strain LVOT Area:     4.15 cm  2D Strain GLS Avg:     -25.9 %  RIGHT VENTRICLE             IVC RV S prime:     12.60 cm/s  IVC diam: 2.00 cm TAPSE (M-mode): 2.2 cm LEFT ATRIUM             Index        RIGHT ATRIUM           Index LA diam:        3.40 cm  1.83 cm/m   RA Area:     14.90 cm LA Vol (A2C):   47.5 ml 25.63 ml/m  RA Volume:   38.50 ml  20.77 ml/m LA Vol (A4C):   29.8 ml 16.08 ml/m LA Biplane Vol: 39.9 ml 21.53 ml/m  AORTIC VALVE LVOT Vmax:   97.45 cm/s LVOT Vmean:  62.650 cm/s LVOT VTI:    0.206 m  AORTA Ao Root diam: 2.70 cm Ao Asc diam:  2.30 cm Ao Desc diam: 2.00 cm MITRAL VALVE               TRICUSPID VALVE MV Area (PHT): 2.91 cm    TR Peak grad:   22.1 mmHg MV Decel Time: 261 msec    TR Vmax:        235.00 cm/s MV E velocity: 81.20 cm/s MV A velocity: 52.70 cm/s  SHUNTS MV E/A ratio:  1.54        Systemic VTI:  0.21 m                            Systemic Diam: 2.30 cm Jenkins Rouge MD Electronically signed by Jenkins Rouge MD Signature Date/Time: 03/20/2022/11:01:55 AM    Final    DG CHEST PORT 1 VIEW  Result Date: 03/20/2022 CLINICAL DATA:  Shortness of breath EXAM: PORTABLE CHEST 1 VIEW COMPARISON:  03/25/2022 FINDINGS: Endotracheal tube with tip between the clavicular heads and carina. An enteric tube reaches the stomach. Artifact from EKG leads. Linear opacity in the right mid lung. Dense opacity with  volume loss at the left base. No pneumothorax IMPRESSION: 1. Worsening left lower lobe aeration with volume loss. Mild atelectasis on the right. 2. Unremarkable hardware positioning. Electronically Signed   By: Jorje Guild M.D.   On: 03/20/2022 06:42   MR CERVICAL SPINE W WO CONTRAST  Result Date: 03/20/2022 CLINICAL DATA:  Myelopathy EXAM: MRI CERVICAL SPINE WITHOUT AND WITH CONTRAST TECHNIQUE: Multiplanar and multiecho pulse sequences of the cervical spine, to include the craniocervical junction and cervicothoracic junction, were obtained without and with intravenous contrast. CONTRAST:  7.5 mL Gadavist COMPARISON:  No prior MRI of the cervical spine, correlation is made with same day MRI head and CT cervical spine FINDINGS: Alignment: No listhesis. Vertebrae: No fracture, evidence of discitis, or bone lesion. Cord: Restricted  diffusion with ADC correlate along the bilateral anterior aspect of the spinal cord from the craniocervical junction (series 6, image 93) to the level of C7 (series 6, image 56). This correlates with increased T2 hyperintense signal, most likely in the anterior horn cells. Spinal cord is otherwise normal in caliber and signal. No abnormal enhancement. Posterior Fossa, vertebral arteries, paraspinal tissues: Negative. Disc levels: Preserved disc height and disc hydration. No spinal canal stenosis or neural foraminal narrowing. 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 These findings were discussed by telephone on 03/20/2022 at 3:30 is am with provider [facility] . Electronically Signed   By: Merilyn Baba M.D.   On: 03/20/2022 03:54   MR BRAIN WO CONTRAST  Result Date: 03/20/2022 CLINICAL DATA:  Altered mental status, stroke suspected EXAM: MRI HEAD WITHOUT CONTRAST TECHNIQUE: Multiplanar, multiecho pulse sequences of the brain and surrounding structures were obtained without intravenous contrast. COMPARISON:  None Available. FINDINGS: Brain: Restricted diffusion with ADC correlate in the anteromedial cervical spinal cord (series 5, image 54) at the craniocervical junction. No other restricted diffusion to suggest acute or subacute infarct. No acute hemorrhage, mass, mass effect, or midline shift. No hydrocephalus or extra-axial collection. Normal pituitary and craniocervical junction. No hemosiderin deposition to suggest remote hemorrhage. Normal cerebral volume for age. Vascular: Normal arterial flow voids. Skull and upper cervical spine: Normal marrow signal. Sinuses/Orbits: Clear paranasal sinuses. No acute finding in the orbits. Other: Trace fluid in the right mastoid air cells. IMPRESSION: 1. Restricted  diffusion with ADC correlate in the anteromedial cervical spinal cord at the craniocervical junction, concerning for acute spinal cord infarct. Attention on subsequent MRI cervical spine. 2. No other acute intracranial process. These findings were discussed by telephone on 03/20/2022 at 3:05 am with provider [facility] . Electronically Signed   By: Merilyn Baba M.D.   On: 03/20/2022 03:14   CT C-SPINE NO CHARGE  Result Date: 03/20/2022 CLINICAL DATA:  Stroke code, concern for cervical pathology EXAM: CT CERVICAL SPINE WITHOUT CONTRAST TECHNIQUE: Multidetector CT imaging of the cervical spine was performed without intravenous contrast. Multiplanar CT image reconstructions were also generated. RADIATION DOSE REDUCTION: This exam was performed according to the departmental dose-optimization program which includes automated exposure control, adjustment of the mA and/or kV according to patient size and/or use of iterative reconstruction technique. COMPARISON:  None Available. FINDINGS: Alignment: No listhesis. Straightening of the normal cervical lordosis, which may be positional. Skull base and vertebrae: No acute fracture. No primary bone lesion or focal pathologic process. Soft tissues and spinal canal: No prevertebral  fluid or swelling. No visible canal hematoma. Disc levels: Disc heights are preserved. No significant degenerative changes. No significant spinal canal stenosis or neural foraminal narrowing. Upper chest: No focal pulmonary opacity or pleural effusion. Other: Endotracheal and orogastric tubes noted. IMPRESSION: No acute or chronic abnormality in the cervical spine. Electronically Signed   By: Merilyn Baba M.D.   On: 03/20/2022 03:00   CT ANGIO HEAD NECK W WO CM W PERF (CODE STROKE)  Result Date: 03/20/2022 CLINICAL DATA:  Stroke suspected, extremity numbness and weakness EXAM: CT ANGIOGRAPHY HEAD AND NECK CT PERFUSION TECHNIQUE: Multidetector CT imaging of the head and neck was performed  using the standard protocol during bolus administration of intravenous contrast. Multiplanar CT image reconstructions and MIPs were obtained to evaluate the vascular anatomy. Carotid stenosis measurements (when applicable) are obtained utilizing NASCET criteria, using the distal internal carotid diameter as the denominator. Multiphase CT imaging of the brain was performed following IV bolus contrast injection. Subsequent parametric perfusion maps were calculated using RAPID software. RADIATION DOSE REDUCTION: This exam was performed according to the departmental dose-optimization program which includes automated exposure control, adjustment of the mA and/or kV according to patient size and/or use of iterative reconstruction technique. CONTRAST:  80 mL Omnipaque 300 COMPARISON:  No prior CTA, correlation is made with CT head 03/20/2022 FINDINGS: CT HEAD FINDINGS For noncontrast findings, please see same day CT head. CTA NECK FINDINGS Aortic arch: Two-vessel arch with a common origin of the brachiocephalic and left common carotid arteries. Imaged portion shows no evidence of aneurysm or dissection. No significant stenosis of the major arch vessel origins. Right carotid system: No evidence of stenosis, dissection, or occlusion. Left carotid system: No evidence of stenosis, dissection, or occlusion. Vertebral arteries: No evidence of stenosis, dissection, or occlusion. Skeleton: No acute osseous abnormality.  No visible canal hematoma. Other neck: Endotracheal and orogastric tubes noted. Otherwise negative. Upper chest: Negative. Review of the MIP images confirms the above findings CTA HEAD FINDINGS Anterior circulation: Both internal carotid arteries are patent to the termini, without significant stenosis. A1 segments patent. Normal anterior communicating artery. Anterior cerebral arteries are patent to their distal aspects. No M1 stenosis or occlusion. MCA branches perfused and symmetric. Posterior circulation:  Vertebral arteries patent to the vertebrobasilar junction without stenosis. Posterior inferior cerebellar arteries patent proximally. Basilar patent to its distal aspect. Superior cerebellar arteries patent proximally. Patent P1 segments. PCAs perfused to their distal aspects without stenosis. The bilateral posterior communicating arteries are diminutive but patent. Venous sinuses: As permitted by contrast timing, patent. Anatomic variants: None significant. Review of the MIP images confirms the above findings CT Brain Perfusion Findings: ASPECTS: 10 CBF (<30%) Volume: 27m Perfusion (Tmax>6.0s) volume: 047mMismatch Volume: 61m48mnfarction Location:None IMPRESSION: 1. No intracranial large vessel occlusion or significant stenosis. 2. No hemodynamically significant stenosis in the neck. 3. No evidence of infarct core or penumbra on CT perfusion. Electronically Signed   By: AliMerilyn BabaD.   On: 03/20/2022 02:43   CT HEAD CODE STROKE WO CONTRAST  Result Date: 03/20/2022 CLINICAL DATA:  Code stroke. EXAM: CT HEAD WITHOUT CONTRAST TECHNIQUE: Contiguous axial images were obtained from the base of the skull through the vertex without intravenous contrast. RADIATION DOSE REDUCTION: This exam was performed according to the departmental dose-optimization program which includes automated exposure control, adjustment of the mA and/or kV according to patient size and/or use of iterative reconstruction technique. COMPARISON:  03/05/2022 FINDINGS: Brain: No evidence of acute infarction, hemorrhage, mass, mass effect,  or midline shift. No hydrocephalus or extra-axial collection. Vascular: No hyperdense vessel. Skull: Negative for fracture or focal lesion. Sinuses/Orbits: No acute finding. Other: The mastoid air cells are well aerated. ASPECTS [facility] Stroke Program Early CT Score) - Ganglionic level infarction (caudate, lentiform nuclei, internal capsule, insula, M1-M3 cortex): 7 - Supraganglionic infarction (M4-M6 cortex):  3 Total score (0-10 with 10 being normal): 10 IMPRESSION: No evidence of acute intracranial abnormality. ASPECTS is 10. Imaging results were communicated on 03/20/2022 at 2:19 am to provider Dr. Lorrin Goodell via secure text paging. Electronically Signed   By: Merilyn Baba M.D.   On: 03/20/2022 02:19   DG CHEST PORT 1 VIEW  Result Date: 03/20/2022 CLINICAL DATA:  Check gastric catheter placement EXAM: PORTABLE CHEST 1 VIEW COMPARISON:  Film from earlier in the same day. FINDINGS: Endotracheal tube is noted in satisfactory position. Gastric catheter extends into the stomach. Lungs are well aerated bilaterally. Mild left basilar atelectasis and small effusion is noted. No other focal abnormality is seen. IMPRESSION: Tubes and lines in satisfactory position. Mild left basilar atelectasis. Electronically Signed   By: Inez Catalina M.D.   On: 03/20/2022 00:12   DG Chest Portable 1 View  Result Date: 03/17/2022 CLINICAL DATA:  OG tube placement EXAM: PORTABLE CHEST 1 VIEW COMPARISON:  03/17/2022, 8:44 p.m. FINDINGS: Similar position of endotracheal tube, positioned over the midtrachea. Similar position of esophagogastric tube, malpositioned, tip over the trachea. Unchanged elevation of the left hemidiaphragm. Heart and mediastinum unremarkable. Osseous structures unremarkable. IMPRESSION: 1. Similar position of esophagogastric tube, malpositioned, tip over the trachea. Recommend repositioning to ensure positioned within the esophagus. 2. Similar position of endotracheal tube, positioned over the midtrachea. These results will be called to the ordering clinician or representative by the Radiologist Assistant, and communication documented in the PACS or [employer]. Electronically Signed   By: Delanna Ahmadi M.D.   On: 03/08/2022 21:33   DG Chest Portable 1 View  Result Date: 03/31/2022 CLINICAL DATA:  Post intubation. EXAM: PORTABLE CHEST 1 VIEW COMPARISON:  03/07/2022. FINDINGS: Heart is enlarged and the  mediastinal contour is within normal limits. No consolidation, effusion, or pneumothorax. A soft tissue density is present over the left lung field, likely reflecting breast tissue. The endotracheal tube terminates 5.1 cm above the carina. An enteric tube terminates in the mid esophagus and should be advanced approximately 26 cm. IMPRESSION: 1. No active disease. 2. An enteric tube terminates in the proximal to mid esophagus and should be advanced 26 cm. 3. Endotracheal tube terminates 5.1 cm above the carina. Electronically Signed   By: Brett Fairy M.D.   On: 03/09/2022 20:57   DG Chest Portable 1 View  Result Date: 03/06/2022 CLINICAL DATA:  Cough weakness EXAM: PORTABLE CHEST 1 VIEW COMPARISON:  None Available. FINDINGS: Hazy opacity at the left base. Right lung is clear. Normal cardiac size. No pneumothorax IMPRESSION: Hazy opacity at the left base may reflect atelectasis or mild pneumonia. Electronically Signed   By: Donavan Foil M.D.   On: 03/18/2022 18:56   CT HEAD WO CONTRAST  Result Date: 03/20/2022 CLINICAL DATA:  Weakness headache unable to move arms and legs EXAM: CT HEAD WITHOUT CONTRAST TECHNIQUE: Contiguous axial images were obtained from the base of the skull through the vertex without intravenous contrast. RADIATION DOSE REDUCTION: This exam was performed according to the departmental dose-optimization program which includes automated exposure control, adjustment of the mA and/or kV according to patient size and/or use of iterative reconstruction technique. COMPARISON:  None Available. FINDINGS: Brain: No evidence of acute infarction, hemorrhage, hydrocephalus, extra-axial collection or mass lesion/mass effect. Vascular: No hyperdense vessel or unexpected calcification. Skull: Normal. Negative for fracture or focal lesion. Sinuses/Orbits: No acute finding. Other: None IMPRESSION: Negative non contrasted CT appearance of the brain. Electronically Signed   By: Donavan Foil M.D.   On:  03/18/2022 18:55     PHYSICAL EXAM  Temp:  [98.4 F (36.9 C)-101.3 F (38.5 C)] 100.6 F (38.1 C) (02/22 1430) Pulse Rate:  [64-107] 64 (02/22 1516) Resp:  [17-26] 26 (02/22 1516) BP: (85-134)/(43-93) 118/71 (02/22 1516) SpO2:  [87 %-100 %] 97 % (02/22 1516) FiO2 (%):  [40 %-50 %] 40 % (02/22 1516) Weight:  [100 kg] 100 kg (02/22 0500)  General - Well nourished, well developed, in no apparent distress, s/p trach.  Ophthalmologic - fundi not visualized due to noncooperation.  Cardiovascular - Regular rhythm and rate.  Neuro - awake alert eyes open, s/p trach still on vent but able to mouth answers, fully orientated, no aphasia, following all simple commands. Able to name and repeat with mouthing words. No gaze palsy, tracking bilaterally, visual field full, PERRL.  Facial symmetrical.  Tongue protrusion midline.  More neck bilateral movement than before, still not able to shrug shoulders.  Bilateral UEs and LEs 0/5, no movement, flaccid without tone. Sensation symmetrical bilaterally subjectively, preserved positional sensation, light touch, b/l DTR diminished, no Babinski, gait not tested.     ASSESSMENT/PLAN Rachael [name] is a 22 y.o. female with history of anxiety, depression, smoker with birth control depot, alcohol abuse, THC user admitted for acute arm and leg weakness, respiratory failure, nausea vomiting difficulty speaking but able to mouth words. No tPA given due to outside window.    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  MRI C-spine signal change at anterior aspect of the spinal cord from the  craniocervical junction to the level of C7, concerning for acute spinal cord ischemia in an anterior spinal artery pattern. 2D Echo  EF 55-60% LE venous doppler no DVT TEE unremarkable, no PFO LDL 111 HgbA1c 5.3 Hypercoagulable work up largely negative, only beta2-glycoprotein IgM slightly high at 42 (will need repeat in 3 months) Autoimmune work up neg (ANA, ANCA, RF) ESR/CRP/RPR/HIV neg B12 = 193, relatively low Heparin subq for VTE prophylaxis No antithrombotic prior to admission, now on aspirin 81 mg daily. 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 Will provide education of statin in childbearing age women) Continue statin at discharge  Dysphagia  Has cortrak On TF Planned PEG, now postponed due to fever.  PEG timing  per primary team.   Nexplanon implant Daily smoker Pt had Nexplanon implanted 2 years ago Daily smoker, 1PPD Smoking cessation counseling provided Negative DVT Discussed with Dr. Hollice Gong and he felt Nexplanon has low risk for thromboembolic event, he will need further discussion with pt before removing   Alcohol abuse Daily drinker, at least half pint Will educate on alcohol limitation CIWA protocol FA/B1/MVI  B12 deficiency B12 = 193, relatively low end of normal B12 supplement  Other Stroke Risk Factors Daily THC user, will education on cessation   Other Active Problems Back pain due to  scoliosis, on Percocet Xanax as needed for anxiety/depression  Hospital day # 6   Rosalin Hawking, MD PhD Stroke Neurology 03/25/2022 4:18 PM    To contact Stroke Continuity provider, please refer to [email or web address] After hours, contact General Neurology

## 2022-03-25 NOTE — Progress Notes (Signed)
CPT held at this time. Pt asleep.

## 2022-03-26 ENCOUNTER — Inpatient Hospital Stay (HOSPITAL_COMMUNITY): Payer: Medicaid Other

## 2022-03-26 DIAGNOSIS — I6389 Other cerebral infarction: Secondary | ICD-10-CM | POA: Diagnosis not present

## 2022-03-26 DIAGNOSIS — R509 Fever, unspecified: Secondary | ICD-10-CM | POA: Diagnosis not present

## 2022-03-26 DIAGNOSIS — J189 Pneumonia, unspecified organism: Secondary | ICD-10-CM | POA: Diagnosis not present

## 2022-03-26 DIAGNOSIS — R4182 Altered mental status, unspecified: Secondary | ICD-10-CM | POA: Diagnosis not present

## 2022-03-26 DIAGNOSIS — Z930 Tracheostomy status: Secondary | ICD-10-CM

## 2022-03-26 DIAGNOSIS — T68XXXA Hypothermia, initial encounter: Secondary | ICD-10-CM | POA: Diagnosis not present

## 2022-03-26 DIAGNOSIS — G8250 Quadriplegia, unspecified: Secondary | ICD-10-CM | POA: Diagnosis not present

## 2022-03-26 DIAGNOSIS — J9621 Acute and chronic respiratory failure with hypoxia: Secondary | ICD-10-CM | POA: Diagnosis not present

## 2022-03-26 HISTORY — PX: IR GASTROSTOMY TUBE MOD SED: IMG625

## 2022-03-26 LAB — BASIC METABOLIC PANEL
Anion gap: 7 (ref 5–15)
BUN: 13 mg/dL (ref 6–20)
CO2: 26 mmol/L (ref 22–32)
Calcium: 8.6 mg/dL — ABNORMAL LOW (ref 8.9–10.3)
Chloride: 102 mmol/L (ref 98–111)
Creatinine, Ser: 0.68 mg/dL (ref 0.44–1.00)
GFR, Estimated: >60 mL/min (ref >60)
Glucose, Bld: 107 mg/dL — ABNORMAL HIGH (ref 70–99)
Potassium: 3.8 mmol/L (ref 3.5–5.1)
Sodium: 135 mmol/L (ref 135–145)

## 2022-03-26 LAB — GLUCOSE, CAPILLARY
Glucose-Capillary: 108 mg/dL — ABNORMAL HIGH (ref 70–99)
Glucose-Capillary: 102 mg/dL — ABNORMAL HIGH (ref 70–99)
Glucose-Capillary: 105 mg/dL — ABNORMAL HIGH (ref 70–99)
Glucose-Capillary: 90 mg/dL (ref 70–99)
Glucose-Capillary: 107 mg/dL — ABNORMAL HIGH (ref 70–99)

## 2022-03-26 LAB — CBC
HCT: 32.8 % — ABNORMAL LOW (ref 36.0–46.0)
Hemoglobin: 10.8 g/dL — ABNORMAL LOW (ref 12.0–15.0)
MCH: 28.6 pg (ref 26.0–34.0)
MCHC: 32.9 g/dL (ref 30.0–36.0)
MCV: 87 fL (ref 80.0–100.0)
Platelets: 244 10*3/uL (ref 150–400)
RBC: 3.77 MIL/uL — ABNORMAL LOW (ref 3.87–5.11)
RDW: 15.3 % (ref 11.5–15.5)
WBC: 14.6 10*3/uL — ABNORMAL HIGH (ref 4.0–10.5)
nRBC: 0 % (ref 0.0–0.2)

## 2022-03-26 LAB — MAGNESIUM: Magnesium: 2.3 mg/dL (ref 1.7–2.4)

## 2022-03-26 LAB — PROCALCITONIN: Procalcitonin: <0.1 ng/mL

## 2022-03-26 LAB — PHOSPHORUS: Phosphorus: 3.5 mg/dL (ref 2.5–4.6)

## 2022-03-26 MED ORDER — CEFAZOLIN SODIUM-DEXTROSE 2-4 GM/100ML-% IV SOLN
INTRAVENOUS | Status: AC | PRN
  Administered 2022-03-26: 2 g via INTRAVENOUS

## 2022-03-26 MED ORDER — SODIUM CHLORIDE 3 % IN NEBU
4.0000 mL | INHALATION_SOLUTION | Freq: Four times a day (QID) | RESPIRATORY_TRACT | Status: DC
  Administered 2022-03-26 (×3): 4 mL via RESPIRATORY_TRACT
  Filled 2022-03-26 (×5): qty 4

## 2022-03-26 MED ORDER — GLUCAGON HCL RDNA (DIAGNOSTIC) 1 MG IJ SOLR
INTRAMUSCULAR | Status: AC | PRN
  Administered 2022-03-26: 1 mg via INTRAVENOUS

## 2022-03-26 MED ORDER — CEFAZOLIN SODIUM-DEXTROSE 2-4 GM/100ML-% IV SOLN
INTRAVENOUS | Status: AC
  Filled 2022-03-26: qty 100

## 2022-03-26 MED ORDER — VANCOMYCIN HCL 2000 MG/400ML IV SOLN
2000.0000 mg | Freq: Once | INTRAVENOUS | Status: AC
  Administered 2022-03-26: 2000 mg via INTRAVENOUS
  Filled 2022-03-26: qty 400

## 2022-03-26 MED ORDER — SODIUM CHLORIDE 3 % IN NEBU
4.0000 mL | INHALATION_SOLUTION | RESPIRATORY_TRACT | Status: DC
  Filled 2022-03-26 (×2): qty 4

## 2022-03-26 MED ORDER — SODIUM CHLORIDE 3 % IN NEBU
4.0000 mL | INHALATION_SOLUTION | Freq: Four times a day (QID) | RESPIRATORY_TRACT | Status: DC
  Administered 2022-03-27 – 2022-03-30 (×12): 4 mL via RESPIRATORY_TRACT
  Filled 2022-03-26 (×15): qty 4

## 2022-03-26 MED ORDER — IOHEXOL 300 MG/ML  SOLN: 50.0000 mL | Freq: Once | INTRAMUSCULAR | Status: DC | PRN

## 2022-03-26 MED ORDER — FREE WATER
300.0000 mL | Status: DC
  Administered 2022-03-26 – 2022-03-30 (×23): 300 mL

## 2022-03-26 MED ORDER — FENTANYL CITRATE (PF) 100 MCG/2ML IJ SOLN
INTRAMUSCULAR | Status: AC
  Filled 2022-03-26: qty 2

## 2022-03-26 MED ORDER — CLOPIDOGREL BISULFATE 75 MG PO TABS: 75.0000 mg | ORAL_TABLET | Freq: Every day | ORAL | Status: DC

## 2022-03-26 MED ORDER — ETOMIDATE 2 MG/ML IV SOLN
20.0000 mg | Freq: Once | INTRAVENOUS | Status: AC
  Administered 2022-03-26: 20 mg via INTRAVENOUS
  Filled 2022-03-26: qty 10

## 2022-03-26 MED ORDER — MIDAZOLAM HCL 2 MG/2ML IJ SOLN
INTRAMUSCULAR | Status: AC
  Filled 2022-03-26: qty 2

## 2022-03-26 MED ORDER — ACETAMINOPHEN 160 MG/5ML PO SOLN
650.0000 mg | Freq: Four times a day (QID) | ORAL | Status: DC | PRN
  Administered 2022-03-26 – 2022-03-27 (×2): 650 mg
  Filled 2022-03-26 (×2): qty 20.3

## 2022-03-26 MED ORDER — GLUCAGON HCL RDNA (DIAGNOSTIC) 1 MG IJ SOLR
INTRAMUSCULAR | Status: AC
  Filled 2022-03-26: qty 1

## 2022-03-26 MED ORDER — MIDAZOLAM HCL 2 MG/2ML IJ SOLN
INTRAMUSCULAR | Status: AC | PRN
  Administered 2022-03-26: .5 mg via INTRAVENOUS
  Administered 2022-03-26: 1 mg via INTRAVENOUS
  Administered 2022-03-26: .5 mg via INTRAVENOUS

## 2022-03-26 MED ORDER — PIPERACILLIN-TAZOBACTAM 3.375 G IVPB
3.3750 g | Freq: Three times a day (TID) | INTRAVENOUS | Status: DC
  Administered 2022-03-26 – 2022-03-28 (×6): 3.375 g via INTRAVENOUS
  Filled 2022-03-26 (×6): qty 50

## 2022-03-26 MED ORDER — VANCOMYCIN HCL IN DEXTROSE 1-5 GM/200ML-% IV SOLN
1000.0000 mg | Freq: Three times a day (TID) | INTRAVENOUS | Status: DC
  Administered 2022-03-26: 1000 mg via INTRAVENOUS
  Filled 2022-03-26 (×2): qty 200

## 2022-03-26 MED ORDER — BANATROL TF EN LIQD
60.0000 mL | Freq: Two times a day (BID) | ENTERAL | Status: DC
  Administered 2022-03-26 – 2022-04-01 (×13): 60 mL
  Filled 2022-03-26 (×13): qty 60

## 2022-03-26 MED ORDER — LIDOCAINE HCL 1 % IJ SOLN
INTRAMUSCULAR | Status: AC
  Filled 2022-03-26: qty 20

## 2022-03-26 MED ORDER — CLOPIDOGREL BISULFATE 75 MG PO TABS
75.0000 mg | ORAL_TABLET | Freq: Every day | ORAL | Status: DC
  Administered 2022-03-27 – 2022-04-14 (×19): 75 mg
  Filled 2022-03-26 (×21): qty 1

## 2022-03-26 MED ORDER — POLYETHYLENE GLYCOL 3350 17 G PO PACK: 17.0000 g | PACK | Freq: Every day | ORAL | Status: DC | PRN

## 2022-03-26 MED ORDER — JEVITY 1.2 CAL PO LIQD
1000.0000 mL | ORAL | Status: DC
  Administered 2022-03-27 – 2022-03-29 (×4): 1000 mL
  Filled 2022-03-26 (×6): qty 1000

## 2022-03-26 MED ORDER — FENTANYL CITRATE (PF) 100 MCG/2ML IJ SOLN
INTRAMUSCULAR | Status: AC | PRN
  Administered 2022-03-26: 25 ug via INTRAVENOUS
  Administered 2022-03-26: 50 ug via INTRAVENOUS
  Administered 2022-03-26: 25 ug via INTRAVENOUS

## 2022-03-26 NOTE — Progress Notes (Signed)
Patient transported to IR for procedure

## 2022-03-26 NOTE — Progress Notes (Signed)
eLink Physician-Brief Progress Note Patient Name: Rachael [name] DOB: [birth year] MRN: [id number]   Date of Service  03/26/2022  HPI/Events of Note  22 year old intubated with bronchoscopy earlier demonstrating left mainstem mucous plugging.  eICU Interventions  Increase frequency of hypertonic nebulizations to manage chest physiotherapy order to 4 hours.    0034 - onight temp via bladder is 101.1. Oral temp 99.3.Giving PRN tylenol. But pt will not allow ice packs. Polymicrobial respiratory growth . Repeat BC and continue observation  0107 - PRN for Nausea and cooling blanket for fever  Intervention Category Minor Interventions: Routine modifications to care plan (e.g. PRN medications for pain, fever)  Travers Goodley 03/26/2022, 9:16 PM

## 2022-03-26 NOTE — Procedures (Signed)
Interventional Radiology Procedure Note  Date of Procedure: 03/26/2022  Procedure: G tube placement   Findings:  1. Successful IR G tube placement, 20 Fr balloon retention    Complications: No immediate complications noted.   Estimated Blood Loss: minimal  Follow-up and Recommendations: 1. Refer to EMR for use    Albin Felling, MD  Vascular & Interventional Radiology  03/26/2022 1:33 PM

## 2022-03-26 NOTE — Progress Notes (Addendum)
STROKE TEAM PROGRESS NOTE   SUBJECTIVE (INTERVAL HISTORY) RN is at the bedside. Pt had PEG today. Still has fever, and Abx changed to vanco and zosyn. TOC will refer to [facility].    OBJECTIVE Temp:  [99.3 F (37.4 C)-100.8 F (38.2 C)] 100.6 F (38.1 C) (02/23 1600) Pulse Rate:  [59-92] 82 (02/23 1600) Cardiac Rhythm: Normal sinus rhythm (02/23 1340) Resp:  [26-27] 26 (02/23 1600) BP: (93-149)/(50-96) 96/54 (02/23 1600) SpO2:  [84 %-100 %] 95 % (02/23 1600) FiO2 (%):  [40 %-100 %] 100 % (02/23 1600)  Recent Labs  Lab 03/25/22 2353 03/26/22 0440 03/26/22 0741 03/26/22 1210 03/26/22 1612  GLUCAP 116* 108* 102* 105* 90   Recent Labs  Lab 03/22/22 0154 03/22/22 0611 03/22/22 2350 03/23/22 0550 03/24/22 0447 03/25/22 0332 03/26/22 0443  NA  --    < > 140 142 139 149* 135  K  --    < > 3.2* 3.7 3.5 3.1* 3.8  CL  --    < > 109 105 106 110 102  CO2  --    < > '26 25 24 24 26  '$ GLUCOSE  --    < > 135* 116* 96 91 107*  BUN  --    < > '7 8 9 10 13  '$ CREATININE  --    < > 0.63 0.68 0.59 1.74* 0.68  CALCIUM  --    < > 7.9* 8.6* 8.1* 6.7* 8.6*  MG 2.2  --  1.9 2.8* 2.2  --  2.3  PHOS 1.4*  --  2.6 2.7 2.7  --  3.5   < > = values in this interval not displayed.   Recent Labs  Lab 03/19/22 1825  AST 14*  ALT 14  ALKPHOS 74  BILITOT 0.6  PROT 7.6  ALBUMIN 4.6   Recent Labs  Lab 03/19/22 1825 03/19/22 1919 03/22/22 0154 03/22/22 0611 03/23/22 0417 03/25/22 0332 03/26/22 0443  WBC 18.3*   < > 20.0* 20.3* 15.0* 11.7* 14.6*  NEUTROABS 15.0*  --   --   --   --   --   --   HGB 13.8   < > 12.0 11.2* 10.2* 9.0* 10.8*  HCT 41.5   < > 37.0 34.4* 31.2* 27.3* 32.8*  MCV 84.3   < > 88.7 87.3 89.7 86.7 87.0  PLT 299   < > 177 193 169 203 244   < > = values in this interval not displayed.   No results for input(s): "CKTOTAL", "CKMB", "CKMBINDEX", "TROPONINI" in the last 168 hours. Recent Labs    03/25/22 0332  LABPROT 14.4  INR 1.1    No results for input(s):  "COLORURINE", "LABSPEC", "PHURINE", "GLUCOSEU", "HGBUR", "BILIRUBINUR", "KETONESUR", "PROTEINUR", "UROBILINOGEN", "NITRITE", "LEUKOCYTESUR" in the last 72 hours.  Invalid input(s): "APPERANCEUR"      Component Value Date/Time   CHOL 176 03/20/2022 1010   TRIG 138 03/20/2022 1010   TRIG 140 03/20/2022 1010   HDL 37 (L) 03/20/2022 1010   CHOLHDL 4.8 03/20/2022 1010   VLDL 28 03/20/2022 1010   LDLCALC 111 (H) 03/20/2022 1010   Lab Results  Component Value Date   HGBA1C 5.3 03/20/2022      Component Value Date/Time   LABOPIA POSITIVE (A) 03/19/2022 1825   COCAINSCRNUR NONE DETECTED 03/19/2022 1825   LABBENZ POSITIVE (A) 03/19/2022 1825   AMPHETMU NONE DETECTED 03/19/2022 1825   THCU POSITIVE (A) 03/19/2022 1825   LABBARB NONE DETECTED 03/19/2022 1825  Recent Labs  Lab 03/19/22 1825  ETH <10    I have personally reviewed the radiological images below and agree with the radiology interpretations.  IR GASTROSTOMY TUBE MOD SED  Result Date: 03/26/2022 INDICATION: Need for long-term enteric access, malnutrition EXAM: Placement of percutaneous gastrostomy tube using fluoroscopic guidance MEDICATIONS: Documented in the EMR ANESTHESIA/SEDATION: Moderate (conscious) sedation was employed during this procedure. A total of Versed 2 mg and Fentanyl 100 mcg was administered intravenously by the radiology nurse. Total intra-service moderate Sedation Time: 16 minutes. The patient's level of consciousness and vital signs were monitored continuously by radiology nursing throughout the procedure under my direct supervision. CONTRAST:  15 mL Omnipaque 300-administered into the gastric lumen. FLUOROSCOPY: Radiation Exposure Index (as provided by the fluoroscopic device): 2.1 minutes (11 mGy) COMPLICATIONS: None immediate. PROCEDURE: Informed written consent was obtained from the patient after a thorough discussion of the procedural risks, benefits and alternatives. All questions were addressed.  Maximal Sterile Barrier Technique was utilized including caps, mask, sterile gowns, sterile gloves, sterile drape, hand hygiene and skin antiseptic. A timeout was performed prior to the initiation of the procedure. Review of pre-procedure CT scan demonstrates an adequate window for percutaneous placement of a gastrostomy tube. The abdomen was prepped and draped in the standard sterile fashion. After insufflating the stomach with air, puncture sites were selected and local analgesia was obtained with 1% lidocaine. Using fluoroscopic guidance, a gastropexy needle was advanced into the stomach and the T-bar suture was released. Entry into the stomach was confirmed with fluoroscopy, aspiration of air, and injection of contrast material. This was repeated with an additional gastropexy suture (for a total of 2 fasteners). At the center of these gastropexy sutures, a dermatotomy was performed. An 18 gauge needle was then passed into the stomach, and position within the gastric lumen again confirmed under fluoroscopy using aspiration of air and injection of contrast material. An Amplatz guidewire was passed through this needle and intraluminal placement was confirmed with fluoroscopy. The needle was removed, and over the guidewire, a 20 French balloon gastrostomy tube with a coaxial 10 mm balloon was advanced into the percutaneous tract. Dilation of the percutaneous tract was then performed using the balloon, followed by advancement of the gastrostomy tube into the gastric lumen. The retention balloon was then inflated with 20 mL of sterile water, and the tube was brought back to the gastric wall. The wire and balloon were removed. The external bumper was brought to the skin. Location of the gastrostomy tube within the stomach was then confirmed with injection of contrast material, opacifying the gastric lumen. The gastrostomy tube was flushed with sterile water, and secured to the skin using a dressing. The patient  tolerated the procedure well without immediate complication, and was transferred to recovery in stable condition. IMPRESSION: Successful placement of a percutaneous 20 French balloon retention gastrostomy tube using fluoroscopic guidance. Electronically Signed   By: Albin Felling M.D.   On: 03/26/2022 13:57   DG Chest Port 1 View  Result Date: 03/26/2022 CLINICAL DATA:  22 year old female with respiratory failure. Tracheostomy. EXAM: PORTABLE CHEST 1 VIEW COMPARISON:  Portable chest yesterday and earlier. FINDINGS: Portable AP semi upright view at 0728 hours. Tracheostomy tube appears stable. Enteric feeding tube remains in place, courses to the abdomen. New white out of the left lung and evidence of left hemithorax volume loss with leftward shift of the mediastinum. Right lung appears clear. No pneumothorax. No osseous abnormality identified.  Negative visible bowel gas. IMPRESSION: 1.  Acute white out of the left hemithorax, favor left lung mucous plugging. Recommend pulmonary toilet and repeat portable chest. 2. Right lung appears clear. Stable tracheostomy and visible enteric feeding tube. Electronically Signed   By: Genevie Ann M.D.   On: 03/26/2022 07:43   DG CHEST PORT 1 VIEW  Result Date: 03/25/2022 CLINICAL DATA:  22 year old female with history of hypoxia. EXAM: PORTABLE CHEST 1 VIEW COMPARISON:  Chest x-ray 03/22/2022. FINDINGS: A tracheostomy tube is in place with tip 5.0 cm above the carina. A feeding tube is seen extending into the abdomen, however, the tip of the feeding tube extends below the lower margin of the image. Lung volumes are low. No consolidative airspace disease. Probable trace right pleural effusion. No left pleural effusion. No pneumothorax. No evidence of pulmonary edema. Heart size is normal. Upper mediastinal contours are within normal limits. IMPRESSION: 1. Support apparatus, as above. 2. Trace right pleural effusion. Electronically Signed   By: Vinnie Langton M.D.   On:  03/25/2022 07:03   DG Abd Portable 1V  Result Date: 03/24/2022 CLINICAL DATA:  Feeding tube placement EXAM: PORTABLE ABDOMEN - 1 VIEW COMPARISON:  03/22/2022 FINDINGS: Feeding tube tip in the mid stomach. IMPRESSION: Feeding tube in the stomach. Electronically Signed   By: Rolm Baptise M.D.   On: 03/24/2022 15:10   CT ABDOMEN WO CONTRAST  Result Date: 03/23/2022 CLINICAL DATA:  Unintended weight loss EXAM: CT ABDOMEN WITHOUT CONTRAST TECHNIQUE: Multidetector CT imaging of the abdomen was performed following the standard protocol without IV contrast. RADIATION DOSE REDUCTION: This exam was performed according to the departmental dose-optimization program which includes automated exposure control, adjustment of the mA and/or kV according to patient size and/or use of iterative reconstruction technique. COMPARISON:  03/22/2022, 06/21/2017 FINDINGS: Lower chest: Dense consolidation within the lower lobes, left greater than right, favor atelectasis. Trace bilateral pleural fluid. Hepatobiliary: Unremarkable unenhanced appearance of the liver and gallbladder. Pancreas: Unremarkable unenhanced appearance. Spleen: Unremarkable unenhanced appearance. Adrenals/Urinary Tract: No urinary tract calculi or obstructive uropathy within either kidney. The adrenals are unremarkable. Stomach/Bowel: No bowel obstruction or ileus. No bowel wall thickening or inflammatory change. Enteric catheter extends into the gastric lumen, tip within the gastric body. Vascular/Lymphatic: No significant vascular findings are present. No abdominal lymphadenopathy. Other: No free fluid or free intraperitoneal gas. No abdominal wall hernia. Musculoskeletal: No acute or destructive bony lesions. Reconstructed images demonstrate no additional findings. IMPRESSION: 1. Dense bibasilar consolidation, left greater than right, favor atelectasis. 2. Enteric catheter within the gastric lumen. 3. Otherwise unremarkable unenhanced CT of the abdomen.  Electronically Signed   By: Randa Ngo M.D.   On: 03/23/2022 22:32   ECHO TEE  Result Date: 03/23/2022    TRANSESOPHOGEAL ECHO REPORT   Patient Name:   SHILYN ORDONEZ Date of Exam: 03/23/2022 Medical Rec #:  [id number]        Height:       67.0 in Accession #:    [id number]       Weight:       223.1 lb Date of Birth:  2000-05-17         BSA:          2.118 m Patient Age:    22 years         BP:           134/79 mmHg Patient Gender: F                HR:  65 bpm. Exam Location:  Inpatient Procedure: Transesophageal Echo, Color Doppler, Cardiac Doppler and Saline            Contrast Bubble Study Indications:    Stroke  History:        Patient has prior history of Echocardiogram examinations, most                 recent 03/20/2022. Risk Factors:Dyslipidemia. Opiates, benzos,                 THC use; Nexplanon.  Sonographer:    Eartha Inch Referring Phys: [id number] Margie Billet PROCEDURE: After discussion of the risks and benefits of a TEE, an informed consent was obtained from the patient. The patient was recently extubated. The transesophogeal probe was passed without difficulty through the esophogus of the patient. Imaged were obtained with the patient in a supine position. Sedation performed by performing physician. Patients was under conscious sedation during this procedure. Anesthetic administered: 21mg of Fentanyl, 4.'0mg'$  of Versed. Image quality was good. The patient's vital signs; including heart rate, blood pressure, and oxygen saturation; remained stable throughout the procedure. The patient developed no complications during the procedure.  IMPRESSIONS  1. Left ventricular ejection fraction, by estimation, is 55 to 60%. The left ventricle has normal function.  2. Right ventricular systolic function is normal. The right ventricular size is normal.  3. No left atrial/left atrial appendage thrombus was detected.  4. The mitral valve is normal in structure. Trivial mitral valve regurgitation.  5. The  aortic valve is tricuspid. Aortic valve regurgitation is trivial.  6. Agitated saline contrast bubble study was negative, with no evidence of any interatrial shunt both at rest and with valsalva. There was one late bubble consistent with a small intrapulmonary shunt. FINDINGS  Left Ventricle: Left ventricular ejection fraction, by estimation, is 55 to 60%. The left ventricle has normal function. The left ventricular internal cavity size was normal in size. Right Ventricle: The right ventricular size is normal. No increase in right ventricular wall thickness. Right ventricular systolic function is normal. Left Atrium: Left atrial size was normal in size. No left atrial/left atrial appendage thrombus was detected. Right Atrium: Right atrial size was normal in size. Pericardium: There is no evidence of pericardial effusion. Mitral Valve: The mitral valve is normal in structure. Trivial mitral valve regurgitation. Tricuspid Valve: The tricuspid valve is normal in structure. Tricuspid valve regurgitation is trivial. Aortic Valve: The aortic valve is tricuspid. Aortic valve regurgitation is trivial. Pulmonic Valve: The pulmonic valve was normal in structure. Pulmonic valve regurgitation is trivial. Aorta: The aortic root is normal in size and structure. IAS/Shunts: No atrial level shunt detected by color flow Doppler. Agitated saline contrast was given intravenously to evaluate for intracardiac shunting. Agitated saline contrast bubble study was negative, with no evidence of any interatrial shunt. Additional Comments: Spectral Doppler performed. HGwyndolyn KaufmanMD Electronically signed by HGwyndolyn KaufmanMD Signature Date/Time: 03/23/2022/12:56:54 PM    Final    DG Abd Portable 1V  Result Date: 03/22/2022 CLINICAL DATA:  Encounter for feeding tube placement. EXAM: PORTABLE ABDOMEN - 1 VIEW COMPARISON:  None Available. FINDINGS: The tip of the weighted enteric tube is just to the right of midline in the region of  the distal stomach or proximal duodenum. There is mild gaseous distention of bowel in the central abdomen. IMPRESSION: Tip of the weighted enteric tube in the distal stomach or proximal duodenum. Electronically Signed   By: MAurther LoftD.  On: 03/22/2022 13:35   DG Chest Port 1 View  Result Date: 03/22/2022 CLINICAL DATA:  Respiratory failure and status post tracheostomy. EXAM: PORTABLE CHEST 1 VIEW COMPARISON:  Film earlier today at 0758 hours FINDINGS: Stable heart size. Interval tracheostomy and removal of endotracheal tube. The tracheostomy tube appears in appropriate position. Standard gastric decompression tube has been removed and a Dobbhoff feeding tube placed. Relatively stable right lower lobe atelectasis. No pneumothorax. IMPRESSION: Appropriate position of tracheostomy tube. Stable right lower lobe atelectasis. Removal of standard gastric decompression tube and placement of Dobbhoff feeding tube. Electronically Signed   By: Aletta Edouard M.D.   On: 03/22/2022 12:50   [place] EKG SITE RITE  Result Date: 03/22/2022 If Site Rite image not attached, placement could not be confirmed due to current cardiac rhythm.  DG Chest Port 1 View  Result Date: 03/22/2022 CLINICAL DATA:  Respiratory failure EXAM: PORTABLE CHEST 1 VIEW COMPARISON:  Chest radiograph 1 day prior FINDINGS: Endotracheal tube tip is proximally 3.1 cm from the carina. The enteric catheter tip is off field of view The cardiomediastinal silhouette is normal There is a small right pleural effusion which may be slightly increased since prior study. Hazy opacities in the right lower lobe are favored to reflect atelectasis. The previously seen small left pleural effusion appears resolved with improved aeration of the left lower lobe. Aeration is otherwise unchanged. There is no pneumothorax The bones are stable. IMPRESSION: 1. New small right pleural effusion with probable adjacent atelectasis. 2. Resolved small left pleural effusion  with improved aeration of the left lower lobe. Electronically Signed   By: Valetta Mole M.D.   On: 03/22/2022 08:18   VAS [place] LOWER EXTREMITY VENOUS (DVT)  Result Date: 03/21/2022  Lower Venous DVT Study Patient Name:  YAVETTE GUTSCH  Date of Exam:   03/21/2022 Medical Rec #: [id number]         Accession #:    [id number] Date of Birth: Jan 13, 2001          Patient Gender: F Patient Age:   60 years Exam Location:  [facility] Procedure:      VAS [place] LOWER EXTREMITY VENOUS (DVT) Referring Phys: Cornelius Moras Uriyah Massimo --------------------------------------------------------------------------------  Other Indications: Spinal stroke, paralysis. Limitations: Ventilation, paralysis. Comparison Study: No prior study on file Performing Technologist: Sharion Dove RVS  Examination Guidelines: A complete evaluation includes B-mode imaging, spectral Doppler, color Doppler, and power Doppler as needed of all accessible portions of each vessel. Bilateral testing is considered an integral part of a complete examination. Limited examinations for reoccurring indications may be performed as noted. The reflux portion of the exam is performed with the patient in reverse Trendelenburg.  +---------+---------------+---------+-----------+----------+-------------------+ RIGHT    CompressibilityPhasicitySpontaneityPropertiesThrombus Aging      +---------+---------------+---------+-----------+----------+-------------------+ CFV      Full           Yes      Yes                                      +---------+---------------+---------+-----------+----------+-------------------+ SFJ      Full                                                             +---------+---------------+---------+-----------+----------+-------------------+  FV Prox  Full                                                             +---------+---------------+---------+-----------+----------+-------------------+ FV Mid   Full                                                              +---------+---------------+---------+-----------+----------+-------------------+ FV DistalFull                                                             +---------+---------------+---------+-----------+----------+-------------------+ PFV      Full                                                             +---------+---------------+---------+-----------+----------+-------------------+ POP                     Yes      Yes                  patent by color and                                                       Doppler             +---------+---------------+---------+-----------+----------+-------------------+ PTV      Full                                                             +---------+---------------+---------+-----------+----------+-------------------+ PERO     Full                                                             +---------+---------------+---------+-----------+----------+-------------------+   +---------+---------------+---------+-----------+----------+--------------+ LEFT     CompressibilityPhasicitySpontaneityPropertiesThrombus Aging +---------+---------------+---------+-----------+----------+--------------+ CFV      Full           Yes      Yes                                 +---------+---------------+---------+-----------+----------+--------------+ SFJ      Full                                                        +---------+---------------+---------+-----------+----------+--------------+  FV Prox  Full                                                        +---------+---------------+---------+-----------+----------+--------------+ FV Mid   Full                                                        +---------+---------------+---------+-----------+----------+--------------+ FV DistalFull                                                         +---------+---------------+---------+-----------+----------+--------------+ PFV      Full                                                        +---------+---------------+---------+-----------+----------+--------------+ POP      Full           Yes      Yes                                 +---------+---------------+---------+-----------+----------+--------------+ PTV      Full                                                        +---------+---------------+---------+-----------+----------+--------------+ PERO     Full                                                        +---------+---------------+---------+-----------+----------+--------------+     Summary: BILATERAL: - No evidence of deep vein thrombosis seen in the lower extremities, bilaterally. -No evidence of popliteal cyst, bilaterally.   *See table(s) above for measurements and observations. Electronically signed by Deitra Mayo MD on 03/21/2022 at 8:20:57 PM.    Final    DG Chest Port 1 View  Result Date: 03/21/2022 CLINICAL DATA:  Intubation.  Respiratory distress. EXAM: PORTABLE CHEST 1 VIEW COMPARISON:  Chest radiograph dated 03/20/2022. FINDINGS: Endotracheal tube with tip approximately 4 cm above the carina. Enteric tube extends below the diaphragm with tip beyond the inferior margin of the image. Improved aeration of the left lower lobe since the earlier radiograph. Minimal right base atelectasis. No pleural effusion or pneumothorax. The cardiac silhouette is within limits. No acute osseous pathology. IMPRESSION: 1. Endotracheal tube above the carina. 2. Improved aeration of the left lower lobe. Electronically Signed   By: Anner Crete M.D.   On: 03/21/2022 01:10   ECHOCARDIOGRAM COMPLETE  Result Date: 03/20/2022  ECHOCARDIOGRAM REPORT   Patient Name:   AZEL KELCH Date of Exam: 03/20/2022 Medical Rec #:  [id number]        Height:       67.0 in Accession #:    [id number]       Weight:       162.9 lb  Date of Birth:  29-Sep-2000         BSA:          1.853 m Patient Age:    22 years         BP:           100/52 mmHg Patient Gender: F                HR:           63 bpm. Exam Location:  Inpatient Procedure: 2D Echo, Cardiac Doppler, Color Doppler and Strain Analysis Indications:    Stroke I63.9  History:        Patient has no prior history of Echocardiogram examinations.  Sonographer:    [place] Referring Phys: [id number] [facility]  Sonographer Comments: Echo performed with patient supine and on artificial respirator. IMPRESSIONS  1. Left ventricular ejection fraction, by estimation, is 55 to 60%. The left ventricle has normal function. The left ventricle has no regional wall motion abnormalities. Left ventricular diastolic parameters were normal. The average left ventricular global longitudinal strain is -25.9 %. The global longitudinal strain is normal.  2. Right ventricular systolic function is normal. The right ventricular size is normal. There is normal pulmonary artery systolic pressure.  3. The mitral valve is normal in structure. No evidence of mitral valve regurgitation. No evidence of mitral stenosis.  4. The aortic valve is tricuspid. Aortic valve regurgitation is not visualized. No aortic stenosis is present.  5. The inferior vena cava is normal in size with greater than 50% respiratory variability, suggesting right atrial pressure of 3 mmHg. FINDINGS  Left Ventricle: Left ventricular ejection fraction, by estimation, is 55 to 60%. The left ventricle has normal function. The left ventricle has no regional wall motion abnormalities. The average left ventricular global longitudinal strain is -25.9 %. The global longitudinal strain is normal. The left ventricular internal cavity size was normal in size. There is no left ventricular hypertrophy. Left ventricular diastolic parameters were normal. Right Ventricle: The right ventricular size is normal. No increase in right ventricular wall  thickness. Right ventricular systolic function is normal. There is normal pulmonary artery systolic pressure. The tricuspid regurgitant velocity is 2.35 m/s, and  with an assumed right atrial pressure of 8 mmHg, the estimated right ventricular systolic pressure is 0000000 mmHg. Left Atrium: Left atrial size was normal in size. Right Atrium: Right atrial size was normal in size. Pericardium: There is no evidence of pericardial effusion. Mitral Valve: The mitral valve is normal in structure. No evidence of mitral valve regurgitation. No evidence of mitral valve stenosis. Tricuspid Valve: The tricuspid valve is normal in structure. Tricuspid valve regurgitation is mild . No evidence of tricuspid stenosis. Aortic Valve: The aortic valve is tricuspid. Aortic valve regurgitation is not visualized. No aortic stenosis is present. Pulmonic Valve: The pulmonic valve was normal in structure. Pulmonic valve regurgitation is not visualized. No evidence of pulmonic stenosis. Aorta: The aortic root is normal in size and structure. Venous: The inferior vena cava is normal in size with greater than 50% respiratory variability, suggesting right atrial pressure of 3 mmHg. IAS/Shunts: No atrial level shunt  detected by color flow Doppler.  LEFT VENTRICLE PLAX 2D LVIDd:         4.70 cm   Diastology LVIDs:         2.60 cm   LV e' medial:    9.25 cm/s LV PW:         1.10 cm   LV E/e' medial:  8.8 LV IVS:        1.00 cm   LV e' lateral:   19.60 cm/s LVOT diam:     2.30 cm   LV E/e' lateral: 4.1 LV SV:         85 LV SV Index:   46        2D Longitudinal Strain LVOT Area:     4.15 cm  2D Strain GLS Avg:     -25.9 %  RIGHT VENTRICLE             IVC RV S prime:     12.60 cm/s  IVC diam: 2.00 cm TAPSE (M-mode): 2.2 cm LEFT ATRIUM             Index        RIGHT ATRIUM           Index LA diam:        3.40 cm 1.83 cm/m   RA Area:     14.90 cm LA Vol (A2C):   47.5 ml 25.63 ml/m  RA Volume:   38.50 ml  20.77 ml/m LA Vol (A4C):   29.8 ml 16.08 ml/m  LA Biplane Vol: 39.9 ml 21.53 ml/m  AORTIC VALVE LVOT Vmax:   97.45 cm/s LVOT Vmean:  62.650 cm/s LVOT VTI:    0.206 m  AORTA Ao Root diam: 2.70 cm Ao Asc diam:  2.30 cm Ao Desc diam: 2.00 cm MITRAL VALVE               TRICUSPID VALVE MV Area (PHT): 2.91 cm    TR Peak grad:   22.1 mmHg MV Decel Time: 261 msec    TR Vmax:        235.00 cm/s MV E velocity: 81.20 cm/s MV A velocity: 52.70 cm/s  SHUNTS MV E/A ratio:  1.54        Systemic VTI:  0.21 m                            Systemic Diam: 2.30 cm Jenkins Rouge MD Electronically signed by Jenkins Rouge MD Signature Date/Time: 03/20/2022/11:01:55 AM    Final    DG CHEST PORT 1 VIEW  Result Date: 03/20/2022 CLINICAL DATA:  Shortness of breath EXAM: PORTABLE CHEST 1 VIEW COMPARISON:  03/19/2022 FINDINGS: Endotracheal tube with tip between the clavicular heads and carina. An enteric tube reaches the stomach. Artifact from EKG leads. Linear opacity in the right mid lung. Dense opacity with volume loss at the left base. No pneumothorax IMPRESSION: 1. Worsening left lower lobe aeration with volume loss. Mild atelectasis on the right. 2. Unremarkable hardware positioning. Electronically Signed   By: Jorje Guild M.D.   On: 03/20/2022 06:42   MR CERVICAL SPINE W WO CONTRAST  Result Date: 03/20/2022 CLINICAL DATA:  Myelopathy EXAM: MRI CERVICAL SPINE WITHOUT AND WITH CONTRAST TECHNIQUE: Multiplanar and multiecho pulse sequences of the cervical spine, to include the craniocervical junction and cervicothoracic junction, were obtained without and with intravenous contrast. CONTRAST:  7.5 mL Gadavist COMPARISON:  No prior MRI of the cervical  spine, correlation is made with same day MRI head and CT cervical spine FINDINGS: Alignment: No listhesis. Vertebrae: No fracture, evidence of discitis, or bone lesion. Cord: Restricted diffusion with ADC correlate along the bilateral anterior aspect of the spinal cord from the craniocervical junction (series 6, image 93) to the level  of C7 (series 6, image 56). This correlates with increased T2 hyperintense signal, most likely in the anterior horn cells. Spinal cord is otherwise normal in caliber and signal. No abnormal enhancement. Posterior Fossa, vertebral arteries, paraspinal tissues: Negative. Disc levels: Preserved disc height and disc hydration. No spinal canal stenosis or neural foraminal narrowing. 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 These findings were discussed by telephone on 03/20/2022 at 3:30 is am with provider [facility] . Electronically Signed   By: Merilyn Baba M.D.   On: 03/20/2022 03:54   MR BRAIN WO CONTRAST  Result Date: 03/20/2022 CLINICAL DATA:  Altered mental status, stroke suspected EXAM: MRI HEAD WITHOUT CONTRAST TECHNIQUE: Multiplanar, multiecho pulse sequences of the brain and surrounding structures were obtained without intravenous contrast. COMPARISON:  None Available. FINDINGS: Brain: Restricted diffusion with ADC correlate in the anteromedial cervical spinal cord (series 5, image 54) at the craniocervical junction. No other restricted diffusion to suggest acute or subacute infarct. No acute hemorrhage, mass, mass effect, or midline shift. No hydrocephalus or extra-axial collection. Normal pituitary and craniocervical junction. No hemosiderin deposition to suggest remote hemorrhage. Normal cerebral volume for age. Vascular: Normal arterial flow voids. Skull and upper cervical spine: Normal marrow signal. Sinuses/Orbits: Clear paranasal sinuses. No acute finding in the orbits. Other: Trace fluid in the right mastoid air cells. IMPRESSION: 1. 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 Attention on  subsequent MRI cervical spine. 2. No other acute intracranial process. These findings were discussed by telephone on 03/20/2022 at 3:05 am with provider [facility] . Electronically Signed   By: Merilyn Baba M.D.   On: 03/20/2022 03:14   CT C-SPINE NO CHARGE  Result Date: 03/20/2022 CLINICAL DATA:  Stroke code, concern for cervical pathology EXAM: CT CERVICAL SPINE WITHOUT CONTRAST TECHNIQUE: Multidetector CT imaging of the cervical spine was performed without intravenous contrast. Multiplanar CT image reconstructions were also generated. RADIATION DOSE REDUCTION: This exam was performed according to the departmental dose-optimization program which includes automated exposure control, adjustment of the mA and/or kV according to patient size and/or use of iterative reconstruction technique. COMPARISON:  None Available. FINDINGS: Alignment: No listhesis. Straightening of the normal cervical lordosis, which may be positional. Skull base and vertebrae: No acute fracture. No primary bone lesion or focal pathologic process. Soft tissues and spinal canal: No prevertebral fluid or swelling. No visible canal hematoma. Disc levels: Disc heights are preserved. No significant degenerative changes. No significant spinal canal stenosis or neural foraminal narrowing. Upper chest: No focal pulmonary opacity or pleural effusion. Other: Endotracheal and orogastric tubes noted. IMPRESSION: No acute or chronic abnormality in the cervical spine. Electronically Signed   By: Merilyn Baba M.D.   On: 03/20/2022 03:00   CT ANGIO HEAD NECK W WO CM W PERF (CODE STROKE)  Result Date: 03/20/2022 CLINICAL DATA:  Stroke suspected, extremity numbness and weakness EXAM: CT ANGIOGRAPHY HEAD AND NECK CT PERFUSION TECHNIQUE: Multidetector CT imaging  of the head and neck was performed using the standard protocol during bolus administration of intravenous contrast. Multiplanar CT image reconstructions and MIPs were obtained to evaluate the  vascular anatomy. Carotid stenosis measurements (when applicable) are obtained utilizing NASCET criteria, using the distal internal carotid diameter as the denominator. Multiphase CT imaging of the brain was performed following IV bolus contrast injection. Subsequent parametric perfusion maps were calculated using RAPID software. RADIATION DOSE REDUCTION: This exam was performed according to the departmental dose-optimization program which includes automated exposure control, adjustment of the mA and/or kV according to patient size and/or use of iterative reconstruction technique. CONTRAST:  80 mL Omnipaque 300 COMPARISON:  No prior CTA, correlation is made with CT head 03/20/2022 FINDINGS: CT HEAD FINDINGS For noncontrast findings, please see same day CT head. CTA NECK FINDINGS Aortic arch: Two-vessel arch with a common origin of the brachiocephalic and left common carotid arteries. Imaged portion shows no evidence of aneurysm or dissection. No significant stenosis of the major arch vessel origins. Right carotid system: No evidence of stenosis, dissection, or occlusion. Left carotid system: No evidence of stenosis, dissection, or occlusion. Vertebral arteries: No evidence of stenosis, dissection, or occlusion. Skeleton: No acute osseous abnormality.  No visible canal hematoma. Other neck: Endotracheal and orogastric tubes noted. Otherwise negative. Upper chest: Negative. Review of the MIP images confirms the above findings CTA HEAD FINDINGS Anterior circulation: Both internal carotid arteries are patent to the termini, without significant stenosis. A1 segments patent. Normal anterior communicating artery. Anterior cerebral arteries are patent to their distal aspects. No M1 stenosis or occlusion. MCA branches perfused and symmetric. Posterior circulation: Vertebral arteries patent to the vertebrobasilar junction without stenosis. Posterior inferior cerebellar arteries patent proximally. Basilar patent to its distal  aspect. Superior cerebellar arteries patent proximally. Patent P1 segments. PCAs perfused to their distal aspects without stenosis. The bilateral posterior communicating arteries are diminutive but patent. Venous sinuses: As permitted by contrast timing, patent. Anatomic variants: None significant. Review of the MIP images confirms the above findings CT Brain Perfusion Findings: ASPECTS: 10 CBF (<30%) Volume: 92m Perfusion (Tmax>6.0s) volume: 0678mMismatch Volume: 78m27mnfarction Location:None IMPRESSION: 1. No intracranial large vessel occlusion or significant stenosis. 2. No hemodynamically significant stenosis in the neck. 3. No evidence of infarct core or penumbra on CT perfusion. Electronically Signed   By: AliMerilyn BabaD.   On: 03/20/2022 02:43   CT HEAD CODE STROKE WO CONTRAST  Result Date: 03/20/2022 CLINICAL DATA:  Code stroke. EXAM: CT HEAD WITHOUT CONTRAST TECHNIQUE: Contiguous axial images were obtained from the base of the skull through the vertex without intravenous contrast. RADIATION DOSE REDUCTION: This exam was performed according to the departmental dose-optimization program which includes automated exposure control, adjustment of the mA and/or kV according to patient size and/or use of iterative reconstruction technique. COMPARISON:  03/19/2022 FINDINGS: Brain: No evidence of acute infarction, hemorrhage, mass, mass effect, or midline shift. No hydrocephalus or extra-axial collection. Vascular: No hyperdense vessel. Skull: Negative for fracture or focal lesion. Sinuses/Orbits: No acute finding. Other: The mastoid air cells are well aerated. ASPECTS ([facility] Early CT Score) - Ganglionic level infarction (caudate, lentiform nuclei, internal capsule, insula, M1-M3 cortex): 7 - Supraganglionic infarction (M4-M6 cortex): 3 Total score (0-10 with 10 being normal): 10 IMPRESSION: No evidence of acute intracranial abnormality. ASPECTS is 10. Imaging results were communicated on  03/20/2022 at 2:19 am to provider Dr. KhaLorrin Goodella secure text paging. Electronically Signed   By: AliFrancetta Found  On: 03/20/2022 02:19   DG CHEST PORT 1 VIEW  Result Date: 03/20/2022 CLINICAL DATA:  Check gastric catheter placement EXAM: PORTABLE CHEST 1 VIEW COMPARISON:  Film from earlier in the same day. FINDINGS: Endotracheal tube is noted in satisfactory position. Gastric catheter extends into the stomach. Lungs are well aerated bilaterally. Mild left basilar atelectasis and small effusion is noted. No other focal abnormality is seen. IMPRESSION: Tubes and lines in satisfactory position. Mild left basilar atelectasis. Electronically Signed   By: Inez Catalina M.D.   On: 03/20/2022 00:12   DG Chest Portable 1 View  Result Date: 03/19/2022 CLINICAL DATA:  OG tube placement EXAM: PORTABLE CHEST 1 VIEW COMPARISON:  03/19/2022, 8:44 p.m. FINDINGS: Similar position of endotracheal tube, positioned over the midtrachea. Similar position of esophagogastric tube, malpositioned, tip over the trachea. Unchanged elevation of the left hemidiaphragm. Heart and mediastinum unremarkable. Osseous structures unremarkable. IMPRESSION: 1. Similar position of esophagogastric tube, malpositioned, tip over the trachea. Recommend repositioning to ensure positioned within the esophagus. 2. Similar position of endotracheal tube, positioned over the midtrachea. These results will be called to the ordering clinician or representative by the Radiologist Assistant, and communication documented in the PACS or [employer]. Electronically Signed   By: Delanna Ahmadi M.D.   On: 03/19/2022 21:33   DG Chest Portable 1 View  Result Date: 03/19/2022 CLINICAL DATA:  Post intubation. EXAM: PORTABLE CHEST 1 VIEW COMPARISON:  03/19/2022. FINDINGS: Heart is enlarged and the mediastinal contour is within normal limits. No consolidation, effusion, or pneumothorax. A soft tissue density is present over the left lung field, likely  reflecting breast tissue. The endotracheal tube terminates 5.1 cm above the carina. An enteric tube terminates in the mid esophagus and should be advanced approximately 26 cm. IMPRESSION: 1. No active disease. 2. An enteric tube terminates in the proximal to mid esophagus and should be advanced 26 cm. 3. Endotracheal tube terminates 5.1 cm above the carina. Electronically Signed   By: Brett Fairy M.D.   On: 03/19/2022 20:57   DG Chest Portable 1 View  Result Date: 03/19/2022 CLINICAL DATA:  Cough weakness EXAM: PORTABLE CHEST 1 VIEW COMPARISON:  None Available. FINDINGS: Hazy opacity at the left base. Right lung is clear. Normal cardiac size. No pneumothorax IMPRESSION: Hazy opacity at the left base may reflect atelectasis or mild pneumonia. Electronically Signed   By: Donavan Foil M.D.   On: 03/19/2022 18:56   CT HEAD WO CONTRAST  Result Date: 03/19/2022 CLINICAL DATA:  Weakness headache unable to move arms and legs EXAM: CT HEAD WITHOUT CONTRAST TECHNIQUE: Contiguous axial images were obtained from the base of the skull through the vertex without intravenous contrast. RADIATION DOSE REDUCTION: This exam was performed according to the departmental dose-optimization program which includes automated exposure control, adjustment of the mA and/or kV according to patient size and/or use of iterative reconstruction technique. COMPARISON:  None Available. FINDINGS: Brain: No evidence of acute infarction, hemorrhage, hydrocephalus, extra-axial collection or mass lesion/mass effect. Vascular: No hyperdense vessel or unexpected calcification. Skull: Normal. Negative for fracture or focal lesion. Sinuses/Orbits: No acute finding. Other: None IMPRESSION: Negative non contrasted CT appearance of the brain. Electronically Signed   By: Donavan Foil M.D.   On: 03/19/2022 18:55     PHYSICAL EXAM  Temp:  [99.3 F (37.4 C)-100.8 F (38.2 C)] 100.6 F (38.1 C) (02/23 1600) Pulse Rate:  [59-92] 82 (02/23  1600) Resp:  [26-27] 26 (02/23 1600) BP: (93-149)/(50-96) 96/54 (02/23 1600) SpO2:  [  84 %-100 %] 95 % (02/23 1600) FiO2 (%):  [40 %-100 %] 100 % (02/23 1600)  General - Well nourished, well developed, in no apparent distress, s/p trach.  Ophthalmologic - fundi not visualized due to noncooperation.  Cardiovascular - Regular rhythm and rate.  Neuro - awake alert eyes open, s/p trach still on vent but able to mouth answers, fully orientated, no aphasia, following all simple commands. Able to name and repeat with mouthing words. No gaze palsy, tracking bilaterally, visual field full, PERRL. Upward beating nystagmus seen on upper gaze and right gaze. Facial symmetrical.  Tongue protrusion midline.  More neck bilateral movement than before, still not able to shrug shoulders.  Bilateral UEs and LEs 0/5, no movement, flaccid without tone. Sensation symmetrical bilaterally subjectively, preserved positional sensation, light touch, b/l DTR diminished, no Babinski, gait not tested.     ASSESSMENT/PLAN Ms. Wilmuth Olivetti is a 22 y.o. female with history of anxiety, depression, smoker with birth control depot, alcohol abuse, THC user admitted for acute arm and leg weakness, respiratory failure, nausea vomiting difficulty speaking but able to mouth words. No tPA given due to outside window.    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  MRI C-spine signal change at anterior aspect of the spinal cord from the craniocervical junction to the level of C7, concerning for acute spinal cord ischemia in an anterior spinal artery pattern. Discussed with  Dr. Estanislado Pandy, no spinal angiography needed at this time.  2D Echo  EF 55-60% LE venous doppler no DVT TEE unremarkable, no PFO LDL 111 HgbA1c 5.3 Hypercoagulable work up largely negative, only beta2-glycoprotein IgM slightly high at 42 (will need repeat in 3 months) Autoimmune work up neg (ANA, ANCA, RF) ESR/CRP/RPR/HIV neg B12 = 193, relatively low Heparin subq for VTE prophylaxis No antithrombotic prior to admission, now on aspirin 81 mg daily. Will start DAPT for 3 weeks tomorrow, and then ASA alone Ongoing aggressive stroke risk factor management Therapy recommendations:  SNF, agree to refer to [facility] center  Disposition:  pending  Respiratory failure Due to high cervical infarct Was intubated, s/p trach 2/19 On vent CCM managing  Fever  Tmax 100.6->101.3->100.6 UA 2/16 neg Urine culture neg CXR trace right pleural effusion S/p broncho 2/23, culture pending On unasyn -> vanco and zosyn  Management per CCM  BP management Stable  Avoid low BP Long term BP goal normotensive  Hyperlipidemia Home meds:  none  LDL 111, goal < 70 Now on crestor 20 (although pt child bearing age, but will not in pregnancy for some time. Will provide education of statin in childbearing age women) Continue statin at discharge  Dysphagia  Has cortrak On TF S/p PEG 2/23 Will start DAPT for 21 days on 2/24  Nexplanon implant Daily smoker Pt had Nexplanon implanted 2 years ago Daily smoker, 1PPD Smoking cessation counseling provided Negative DVT Discussed with Dr. 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 Neurology will follow peripherally. Please feel free to call with any further questions. Pt will follow up with stroke clinic Dr. Leonie Man at [facility] in about 3-4 weeks after discharge if still in [place]. Thanks for the consult.   Rosalin Hawking, MD PhD Stroke Neurology 03/26/2022 5:21 PM    To contact Stroke Continuity provider, please refer to [email or web address] After hours, contact General Neurology

## 2022-03-26 NOTE — Progress Notes (Signed)
Pharmacy Antibiotic Note  Rachael [name] is a 22 y.o. 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 Intubated, sedated and started on Unasyn for pneumonia.  Still with fevers 101, WBC elevated  14 not on steroids.  Pharmacy has been consulted for vancomycin and zosyn dosing. Cr < 1 CrCl 130 est AUC400. S/p Bronch 2/23 will f/u Cx data  Plan: Vancoycin 2gm x1 then  Vancomycin '1000mg'$  IV every 8 hours.  Goal trough 15-20 mcg/mL. Zosyn 3.375g IV q8h (4 hour infusion).  Height: '5\' 7"'$  (170.2 cm) Weight: 100 kg (220 lb 7.4 oz) IBW/kg (Calculated) : 61.6  Temp (24hrs), Avg:100.2 F (37.9 C), Min:99 F (37.2 C), Max:101.5 F (38.6 C)  Recent Labs  Lab 03/19/22 1829 03/20/22 0030 03/20/22 1010 03/22/22 0154 03/22/22 [id number] 03/22/22 2350 03/23/22 0417 03/23/22 0550 03/24/22 0447 03/25/22 0332 03/26/22 0443  WBC  --   --    < > 20.0* 20.3*  --  15.0*  --   --  11.7* 14.6*  CREATININE  --   --    < >  --  0.62 0.63  --  0.68 0.59 1.74* 0.68  LATICACIDVEN 0.7 1.8  --   --   --   --   --   --   --   --   --    < > = values in this interval not displayed.    Estimated Creatinine Clearance: 135.2 mL/min (by C-G formula based on SCr of 0.68 mg/dL).    No Known Allergies  Antimicrobials this admission: Unasyn 2/21>2/23 Vanc 2/23> Zosyn 2/23>  Dose adjustments this admission:   Microbiology results: 2/23 Bronch  2/19 TA ngtd 2/16 BCx: ngtd 2/16 MRSA PCR: neg    Bonnita Nasuti Pharm.D. CPP, BCPS Clinical Pharmacist [phone] 03/26/2022 10:05 AM   03/26/2022 9:54 AM

## 2022-03-26 NOTE — Sedation Documentation (Signed)
Patient transported back to 2 Heart ICU with RRT and IR staff. Tanya RN at the bedside to receive patient handoff. G-tube in place.

## 2022-03-26 NOTE — Procedures (Signed)
Bronchoscopy Procedure Note  Kattaleya Liner  KE:[id number]  2000-06-04  Date:03/26/22  Time:8:39 AM   Provider Performing:Jeanett Antonopoulos C Tamala Julian   Procedure(s):  Flexible bronchoscopy with bronchial alveolar lavage [phone]) and Initial Therapeutic Aspiration of Tracheobronchial Tree [phone])  Indication(s) Mucus plugging, r/o HCAP  Consent Risks of the procedure as well as the alternatives and risks of each were explained to the patient and/or caregiver.  Consent for the procedure was obtained and is signed in the bedside chart Mother at bedside for procedure  Anesthesia Etomidate '20mg'$   Time Out Verified patient identification, verified procedure, site/side was marked, verified correct patient position, special equipment/implants available, medications/allergies/relevant history reviewed, required imaging and test results available.   Sterile Technique Usual hand hygiene, masks, gowns, and gloves were used   Procedure Description Bronchoscope advanced through tracheostomy tube and into airway.  Airways were examined down to subsegmental level with findings noted below.   Following diagnostic evaluation, BAL(s) performed in LLL with normal saline and return of mostly clear fluid and Therapeutic aspiration performed in left mainstem  Findings:  - L mainstem mucus plugging with thick mucoid secretions extended to both LLL and LUL suctioned successfully, underlying bronchi only mildly inflammed   Complications/Tolerance None; patient tolerated the procedure well. Chest X-ray is not needed post procedure.   EBL Minimal   Specimen(s) LLL BAL  PEEP to 10 hypertonic nebs + CPT

## 2022-03-26 NOTE — Progress Notes (Addendum)
NAME:  Rachael [name], MRN:  [id number], DOB:  [birth year], LOS: 7 ADMISSION DATE:  03/19/2022, CONSULTATION DATE:  03/26/22 REFERRING MD:  drawbridge, CHIEF COMPLAINT:  shortness of breath and weakness   History of Present Illness:  Rachael [name] is a 22 y.o. F with history of depression and suicidal ideation who presented to [place] ED with sudden onset of shortness of breath and upper and lower extremity weakness.  History taken from Epic as patient is intubated and no family is at the bedside.  She initially had a headache and back pain prior to onset but denied abdominal pain or chest pain.  Vitals showed an initial temp of 43F, bloodwork with WBC 18k, UDS + for bezos and THC.   Her symptoms progressed to the point she wasn't speaking and ABG showed hypercarbic respiratory failure so pt was intubated.  She had some bradycardia and hypotension post-intubation, so was started on peripheral Levophed and given atropine during transport   Pertinent  Medical History   has a past medical history of Depression, Hyperlipidemia, and Kidney abscess.   Significant Hospital Events: Including procedures, antibiotic start and stop dates in addition to other pertinent events   2/16 presented to DB with all weakness and shortness of breath requiring intubation and transfer to [facility] 2/18 episodes of desaturations and hypercarbia unexplained 2/19 trach, cortrak, PICC  Interim History / Subjective:  Febrile yesterday, 100.6 this AM. Sounds a bit more rhoncorous on right; however, suspect fever might be neurologic driven. FiO2 did increase from 40% to 50%. CXR pending.  Addendum: CXR with whiteout on left. Will plan for bronch and lavage this AM Expand abx to vanc/zosyn for now.  Objective   Blood pressure (!) 108/54, pulse 83, temperature 99.9 F (37.7 C), resp. rate (!) 26, height '5\' 7"'$  (1.702 m), weight 100 kg, SpO2 90 %.    Vent Mode: PRVC FiO2 (%):  [40 %-50 %] 50 % Set Rate:  [26 bmp] 26  bmp Vt Set:  [490 mL] 490 mL PEEP:  [5 cmH20] 5 cmH20 Plateau Pressure:  [17 cmH20-21 cmH20] 20 cmH20   Intake/Output Summary (Last 24 hours) at 03/26/2022 0720 Last data filed at 03/26/2022 0600 Gross per 24 hour  Intake 1423.19 ml  Output 4415 ml  Net -2991.81 ml    Filed Weights   03/23/22 1400 03/24/22 0500 03/25/22 0500  Weight: 104.7 kg 101.3 kg 100 kg   Physical Exam: General: Adult female, resting in bed, in NAD. Getting chest PT Neuro: Awake on vent, mouths words but not able to move extremities. HEENT: [place]/AT. Sclerae anicteric. Trach C/D/I. Cardiovascular: RRR, no M/R/G.  Lungs: Respirations even and unlabored.  Rhonchi R > L. Abdomen: BS x 4, soft, NT/ND.  Musculoskeletal: No gross deformities, no edema.  Skin: Intact, warm, no rashes.  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 Hypercoagulable workup not th at impressive, IgM glycoprotein slightly elevated, needs repeat in 3 months. Autoimmune workup neg. Acute hypercapnic and hypoxic respiratory failure due to NM weakness - now s/p trach Neurogenic bladder, urinary retention - Neurology following/managing, appreciate the assistance - aspirin, statin, plavix  after PEG (PEG was planned for 2/22 but postponed 2/2 fever, remains febrile 2/23 though this is likely partially neurologic). - OB resistant to removing implanon, wants to discuss with pt further first as they feel low risk for thromboembolic event - foley, but will eventually require bladder training -maintain neuroprotective measures -PT, OT as  able -SLP to work with speech and swallow with trach -palliative care discussions regarding timelines and code status-- see their note -TOC order placed to inquire about placement at [facility] in [place] for ongoing rehab etc  Acute hypoxic & hypercarbic respiratory failure requiring MV due to NM weakness from C spine stroke - s/p tracheostomy placement Acute hypercapnia with acute respiratory acidosis Aspiration pneumonia RLL - cultures with normal flora thus far. White out left chest - probable plug vs HCAP -trach care per protocol -daily SAT & SBT -LTVV -VAP prevention protocol -can remove sutures after 7 days (2/26) -complete 7 days of antibiotics (currently on Unasyn, change to vanc/zosyn 2/23) -Bronch and BAL this AM -wea FiO2 as able - Continue CPT & hypertonic saline (added 2/23); anticipate she will require a pulmonary clearance regimen - CXR now  Shock likely neurogenic - shock resolved - Supportive care  Polysubstance abuse- tobacco, MJ. Has prescribed opiates and benzos. ETOH abuse  At risk for withdrawals - so far no signs - con't zoloft and clonazepam PRN - vitamins  Loose stools -bowel regimen PRN -anticipate will need a chronic bowel regimen to prevent constipation  Anemia due to critical illness -transfuse for Hb <7 or hemodynamically significant bleeding  Hyperglycemia, mild and controlled w/o insulin -d/c routine accuchecks -goal BG <180  At risk for malnutrition -Continue TF -IR consulted for PEG, postponed 2/23 due to fever   Best Practice (right click and "Reselect all SmartList Selections" daily)   Diet/type: tubefeeds DVT prophylaxis: LMWH GI prophylaxis: PPI Lines: Central line Foley:  yes Code Status:  full code>> per discussion with palliative now DNR Last date of multidisciplinary goals of care discussion [ mother updated at bedside 2/21]   Critical care time: 30 min.    Montey Hora, [facility]  [facility] For pager details, please see AMION or use Epic chat  After 1900, please call [facility] for cross coverage needs 03/26/2022, 7:20 AM

## 2022-03-26 NOTE — Progress Notes (Signed)
Patient transported from IR to 2H02 without incident.

## 2022-03-26 NOTE — TOC Progression Note (Addendum)
Transition of Care [facility]) - Progression Note    Patient Details  Name: Rachael [name] MRN: [id number] Date of Birth: 07-09-2000  [facility] CM/SW Contact  Erenest Rasher, RN Phone Number: [phone] 03/26/2022, 5:05 PM  Clinical Narrative:     Received referral for attending to follow up with [facility] in [facility] specialty hospital for spinal cord injury. [place] # [phone] and spoke to [facility]. Requested clinicals faxed to # [phone]. Attn Admission. They will review. They will check Medicaid benefits to see if out of network benefits are offered for hospital and coverage of transportation. Message sent to attending with update.   Spoke to pt's mother, [name] and she is agreeable to transfer if accepted at [facility].   Faxed referral to [facility].   Expected Discharge Plan: IP Rehab Facility Barriers to Discharge: Continued Medical Work up  Expected Discharge Plan and Services   Discharge Planning Services: CM Consult   Living arrangements for the past 2 months: Single Family Home                                       Social Determinants of Health (SDOH) Interventions SDOH Screenings   Food Insecurity: No Food Insecurity (03/23/2022)  Housing: Low Risk  (03/23/2022)  Transportation Needs: No Transportation Needs (03/23/2022)  Utilities: Not At Risk (03/23/2022)  Tobacco Use: Medium Risk (03/26/2022)    Readmission Risk Interventions     No data to display

## 2022-03-26 NOTE — Progress Notes (Signed)
Physical Therapy Treatment Patient Details Name: Rachael [name] MRN: [id number] DOB: Nov 18, 2000 Today's Date: 03/26/2022   History of Present Illness Rachael [name] is a 22 y.o. female admitted 03/19/22 with acute arms and leg weakness, dyspnea and hypercabia.  (+) for polysubstances. Pt was intubated on 2/16.   MRI confirms anteromedial high cervical spinal cord and lower medulla stroke consistent with an anterior spinal artery stroke  PMH significant for depression, HLD, Kidney abscess    PT Comments    Pt assisted for rolling and linen change. PROm all extremities performed with pt on vent support with need for O2 breaths during session. Pt with noted vertical nystagmus throughout and unable to visually focus. Family educated for [place] all extremities and positioning into chair position to tolerate upright for progression to OOB.     Recommendations for follow up therapy are one component of a multi-disciplinary discharge planning process, led by the attending physician.  Recommendations may be updated based on patient status, additional functional criteria and insurance authorization.  Follow Up Recommendations  Skilled nursing-short term rehab (<3 hours/day) Can patient physically be transported by private vehicle: No   Assistance Recommended at Discharge Frequent or constant Supervision/Assistance  Patient can return home with the following Two people to help with walking and/or transfers;Two people to help with bathing/dressing/bathroom;Assistance with cooking/housework;Assistance with feeding;Direct supervision/assist for medications management;Direct supervision/assist for financial management;Assist for transportation;Help with stairs or ramp for entrance   Equipment Recommendations  Hospital bed;Wheelchair (measurements PT);Wheelchair cushion (measurements PT);Other (comment) (tilt in space power chair, hoyer)    Recommendations for Other Services       Precautions /  Restrictions Precautions Precaution Comments: Trach, vent, cortrak, quadriplegia, flexiseal     Mobility  Bed Mobility Overal bed mobility: Needs Assistance             General bed mobility comments: total +2 to roll bil for pericare and linen change. Transitioned from supine to chair position 45 degrees 10 min for ROM with maintained BP    Transfers                   General transfer comment: total assist will need lift    Ambulation/Gait                   Stairs             Wheelchair Mobility    Modified Rankin (Stroke Patients Only)       Balance                                            Cognition Arousal/Alertness: Awake/alert Behavior During Therapy: Flat affect Overall Cognitive Status: Within Functional Limits for tasks assessed                                 General Comments: responding appropriately to all questions with mouthing words        Exercises General Exercises - Upper Extremity Shoulder Flexion: PROM, Both, Seated, 10 reps Shoulder ABduction: PROM, Both, Seated, 10 reps Elbow Flexion: PROM, Both, Seated, 10 reps Elbow Extension: PROM, Both, Seated, 10 reps Wrist Flexion: PROM, Both, Seated, 10 reps Wrist Extension: PROM, Both, Seated, 10 reps General Exercises - Lower Extremity Ankle Circles/Pumps: PROM, Both, 10 reps, Seated Short Arc Quad: PROM, Both, Seated,  10 reps Hip ABduction/ADduction: PROM, Both, Seated, 10 reps Hip Flexion/Marching: PROM, Both, Seated, 10 reps    General Comments        Pertinent Vitals/Pain Pain Assessment Faces Pain Scale: No hurt    Home Living                          Prior Function            PT Goals (current goals can now be found in the care plan section) Progress towards PT goals: Not progressing toward goals - comment (tolerating ROM, family present for education to assist with PROM)    Frequency    Min  2X/week      PT Plan Current plan remains appropriate    Co-evaluation              AM-PAC PT "6 Clicks" Mobility   Outcome Measure  Help needed turning from your back to your side while in a flat bed without using bedrails?: Total Help needed moving from lying on your back to sitting on the side of a flat bed without using bedrails?: Total Help needed moving to and from a bed to a chair (including a wheelchair)?: Total Help needed standing up from a chair using your arms (e.g., wheelchair or bedside chair)?: Total Help needed to walk in hospital room?: Total Help needed climbing 3-5 steps with a railing? : Total 6 Click Score: 6    End of Session   Activity Tolerance: Patient tolerated treatment well Patient left: in bed;with call bell/phone within reach;with nursing/sitter in room;with family/visitor present Nurse Communication: Mobility status;Need for lift equipment PT Visit Diagnosis: Muscle weakness (generalized) (M62.81);Other symptoms and signs involving the nervous system (R29.898)     Time: [id number] PT Time Calculation (min) (ACUTE ONLY): 28 min  Charges:  $Therapeutic Exercise: 8-22 mins $Therapeutic Activity: 8-22 mins                     Rachael [name], PT Acute Rehabilitation Services Office: [phone]    Rachael [name] 03/26/2022, 11:17 AM

## 2022-03-26 NOTE — Progress Notes (Signed)
Nutrition Follow-up  DOCUMENTATION CODES:   Not applicable  INTERVENTION:   Tube Feeding via PEG:  Jevity 1.2 at 70 ml/hr This provides 2016 kcals, 93 g of protein and 1277 mL of free water  Free water flush increased to 300 mL q 4 hours today while off TF.   Plan to consider transitioning to bolus feedings next week  Trial of fiber containing formula, addition of banatrol TF BID with goal to thicken stool and have rectal tube removed   NUTRITION DIAGNOSIS:   Inadequate oral intake related to inability to eat as evidenced by NPO status.  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   PMH includes chronic prescription opioid and benzo use, depression with SI, HLD, kidney abscess. Pt is daily smoker; EtOH abuse, daily drinker-at least half pint  2/16 Admitted 2/19 Trach placed, Cortrak placed (distal stomach vs proximal duodenum) 2/23 Bronch: L mainstem mucous plugging, thick secretions, IR placed PEG tube  Pt remains on vent support via trach; alert and able to mouth words.   Neurology following, pt with more bilateral neck movement but still quadriplegia  Cortrak tube in place, TF have been held at midnight the last 3 days for possible PEG. Plan to resume TF tomorrow per Radiology. Pt had been tolerating TF well when infusing. Plan to increase free water in the mean time as pt not receiving any IV hydration (IV at [facility] only)  Rectal tube remains in place; goals are to thicken stool and  allow for removal of rectal tube. Plan to add banatrol TF today, trial  fiber containing standard formula.  Change bowel regimen to prn  Labs: reviewed Meds: MVI, thiamine, folic acid, [id number]  Diet Order:   Diet Order             Diet NPO time specified Except for: Sips with Meds  Diet effective midnight                   EDUCATION NEEDS:   Not appropriate for education at this time  Skin:  Skin Assessment: Reviewed RN Assessment  Last BM:  2/22 rectal tube  Height:   Ht Readings from Last 1 Encounters:  03/22/22 '5\' 7"'$  (1.702 m)    Weight:   Wt Readings from Last 1 Encounters:  03/25/22 100 kg    Ideal Body Weight:  61.4 kg  BMI:  Body mass index is 34.53 kg/m.  Estimated Nutritional Needs:   Kcal:  1850-2050  Protein:  95-115 grams  Fluid:  >/= 1.8 L   Kerman Passey MS, RDN, LDN, CNSC Registered Dietitian 3 Clinical Nutrition RD Pager and On-Call Pager Number Located in [place]

## 2022-03-27 ENCOUNTER — Inpatient Hospital Stay (HOSPITAL_COMMUNITY): Payer: Medicaid Other

## 2022-03-27 DIAGNOSIS — J189 Pneumonia, unspecified organism: Secondary | ICD-10-CM | POA: Diagnosis not present

## 2022-03-27 LAB — BASIC METABOLIC PANEL
Anion gap: 8 (ref 5–15)
BUN: 11 mg/dL (ref 6–20)
CO2: 25 mmol/L (ref 22–32)
Calcium: 8.2 mg/dL — ABNORMAL LOW (ref 8.9–10.3)
Chloride: 100 mmol/L (ref 98–111)
Creatinine, Ser: 0.77 mg/dL (ref 0.44–1.00)
GFR, Estimated: >60 mL/min (ref >60)
Glucose, Bld: 94 mg/dL (ref 70–99)
Potassium: 3.6 mmol/L (ref 3.5–5.1)
Sodium: 133 mmol/L — ABNORMAL LOW (ref 135–145)

## 2022-03-27 LAB — GLUCOSE, CAPILLARY
Glucose-Capillary: 108 mg/dL — ABNORMAL HIGH (ref 70–99)
Glucose-Capillary: 114 mg/dL — ABNORMAL HIGH (ref 70–99)
Glucose-Capillary: 124 mg/dL — ABNORMAL HIGH (ref 70–99)
Glucose-Capillary: 77 mg/dL (ref 70–99)
Glucose-Capillary: 93 mg/dL (ref 70–99)
Glucose-Capillary: 94 mg/dL (ref 70–99)
Glucose-Capillary: 96 mg/dL (ref 70–99)

## 2022-03-27 LAB — CBC
HCT: 31.7 % — ABNORMAL LOW (ref 36.0–46.0)
Hemoglobin: 10.1 g/dL — ABNORMAL LOW (ref 12.0–15.0)
MCH: 28.1 pg (ref 26.0–34.0)
MCHC: 31.9 g/dL (ref 30.0–36.0)
MCV: 88.3 fL (ref 80.0–100.0)
Platelets: 235 10*3/uL (ref 150–400)
RBC: 3.59 MIL/uL — ABNORMAL LOW (ref 3.87–5.11)
RDW: 14.6 % (ref 11.5–15.5)
WBC: 16.7 10*3/uL — ABNORMAL HIGH (ref 4.0–10.5)
nRBC: 0 % (ref 0.0–0.2)

## 2022-03-27 MED ORDER — VANCOMYCIN HCL IN DEXTROSE 1-5 GM/200ML-% IV SOLN
1000.0000 mg | Freq: Three times a day (TID) | INTRAVENOUS | Status: DC
  Administered 2022-03-27 – 2022-03-28 (×3): 1000 mg via INTRAVENOUS
  Filled 2022-03-27 (×3): qty 200

## 2022-03-27 MED ORDER — ACETAMINOPHEN 325 MG PO TABS
650.0000 mg | ORAL_TABLET | Freq: Four times a day (QID) | ORAL | Status: DC
  Administered 2022-03-27 – 2022-04-02 (×25): 650 mg
  Filled 2022-03-27 (×25): qty 2

## 2022-03-27 MED ORDER — GERHARDT'S BUTT CREAM
TOPICAL_CREAM | Freq: Once | CUTANEOUS | Status: AC
  Filled 2022-03-27: qty 1

## 2022-03-27 MED ORDER — SODIUM CHLORIDE 0.9 % IV SOLN
8.0000 mg | Freq: Four times a day (QID) | INTRAVENOUS | Status: DC | PRN
  Filled 2022-03-27 (×3): qty 4

## 2022-03-27 MED ORDER — BACITRACIN-NEOMYCIN-POLYMYXIN OINTMENT TUBE
TOPICAL_OINTMENT | Freq: Every day | CUTANEOUS | Status: AC | PRN
  Filled 2022-03-27: qty 14

## 2022-03-27 MED ORDER — VANCOMYCIN HCL IN DEXTROSE 1-5 GM/200ML-% IV SOLN: 1000.0000 mg | Freq: Three times a day (TID) | INTRAVENOUS | Status: DC

## 2022-03-27 NOTE — Progress Notes (Signed)
   NAME:  Rachael [name], MRN:  [id number], DOB:  06/19/2000, LOS: 8 ADMISSION DATE:  03/25/2022, CONSULTATION DATE:  03/27/22 REFERRING MD:  drawbridge, CHIEF COMPLAINT:  shortness of breath and weakness   History of Present Illness:  Rachael [name] is a 22 y.o. F with history of depression and suicidal ideation who presented to [place] ED with sudden onset of shortness of breath and upper and lower extremity weakness.  History taken from Epic as patient is intubated and no family is at the bedside.  She initially had a headache and back pain prior to onset but denied abdominal pain or chest pain.  Vitals showed an initial temp of 59F, bloodwork with WBC 18k, UDS + for bezos and THC.   Her symptoms progressed to the point she wasn't speaking and ABG showed hypercarbic respiratory failure so pt was intubated.  She had some bradycardia and hypotension post-intubation, so was started on peripheral Levophed and given atropine during transport   Pertinent  Medical History   has a past medical history of Depression, Hyperlipidemia, and Kidney abscess.   Significant Hospital Events: Including procedures, antibiotic start and stop dates in addition to other pertinent events   2/16 presented to DB with all weakness and shortness of breath requiring intubation and transfer to [facility] 2/18 episodes of desaturations and hypercarbia unexplained 2/19 trach, cortrak, PICC 2/23 therapeutic bronch  Interim History / Subjective:  No events. Ongoing fevers. Thick secretions. Blood cultures drawn this am. Denies pain.  Objective   Blood pressure 125/67, pulse 67, temperature 99.5 F (37.5 C), resp. rate (!) 26, height 5' 7"$  (1.702 m), weight 100 kg, SpO2 96 %.    Vent Mode: PRVC FiO2 (%):  [70 %-100 %] 70 % Set Rate:  [26 bmp] 26 bmp Vt Set:  [490 mL] 490 mL PEEP:  [10 cmH20] 10 cmH20 Plateau Pressure:  [24 cmH20-25 cmH20] 25 cmH20   Intake/Output Summary (Last 24 hours) at 03/27/2022 0801 Last data  filed at 03/27/2022 0700 Gross per 24 hour  Intake 1549.99 ml  Output 2350 ml  Net -800.01 ml    Filed Weights   03/23/22 1400 03/24/22 0500 03/25/22 0500  Weight: 104.7 kg 101.3 kg 100 kg   No distress Quadriplegia Able to mouth words appropriately Mild anasarca Diminished breath sounds on left Trach small thick secretions  Pct neg BAL pending Trach asp neg Blood cx pending  Assessment & Plan:   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  Some combination of birth control and smoking likely culprit. Acute hypercapneic and hypoxic respiratory failure due to NM weakness - now s/p trach.  Cannot tolerate PS nor SIMV, she is not triggering vent for me. Neurogenic bladder, urinary retention Polysubstance abuse- tobacco, MJ. Has prescribed opiates and benzos. Dysphagia s/p PEG 2/23 Ongoing fevers- central vs. Pulmonary in origin, would not chase unless HD significant changes Mucus plugging of bronchi- therapeutic bronch 2/23.  BAL pending.  CPT + hypertonic ordered  - f/u culture data, continue HCAP coverage for now - Continue pulmonary toileting measures - Continue full vent support - DAPT, statin per neuro - Reaching out to [facility] center to see if qualifies/eligible for neuro rehab - DNR  Erskine Emery MD PCCM

## 2022-03-27 NOTE — Progress Notes (Signed)
eLink Physician-Brief Progress Note Patient Name: Rachael [name] DOB: 19-Jun-2000 MRN: [id number]   Date of Service  03/27/2022  HPI/Events of Note  Bedside is requesting for "Gerhardt's butt cream"  eICU Interventions  ordered     Intervention Category Minor Interventions: Routine modifications to care plan (e.g. PRN medications for pain, fever)  Elmer Sow 03/27/2022, 7:45 PM  5:45 AM Patient as periods of soft BPs about this time of night. Last BP was 103/52 (66) Output is higher than intake.   Tmp 101.7 - Getting tylenol.   Camera: HR 76, sinus, trach, sats 98%. MAP 66  Discussed Counsellor. S/p Bronch on 2/23 rd. S/p PEG. Blood cx from 24 th no growth. 23 rd: [place]. On vanc and zposyn already  WBC stable at 16 K. Hg 9.9 UOP good.  CxR from 24 th: left lung volume low, atlectasis, effusion.  Trach. Right side ok, could have RLL air space density, rotated film.  Anterior cord syndrome, on going fever.  AHHRF s/p trach.   - fever could be neurogenic. No tachy for fever.  Potassium 3.5, on replacement.  - would give NS 500 ml bolus. -continue care.

## 2022-03-28 DIAGNOSIS — T68XXXA Hypothermia, initial encounter: Secondary | ICD-10-CM | POA: Diagnosis not present

## 2022-03-28 LAB — GLUCOSE, CAPILLARY
Glucose-Capillary: 118 mg/dL — ABNORMAL HIGH (ref 70–99)
Glucose-Capillary: 151 mg/dL — ABNORMAL HIGH (ref 70–99)
Glucose-Capillary: 142 mg/dL — ABNORMAL HIGH (ref 70–99)
Glucose-Capillary: 167 mg/dL — ABNORMAL HIGH (ref 70–99)
Glucose-Capillary: 132 mg/dL — ABNORMAL HIGH (ref 70–99)

## 2022-03-28 LAB — CULTURE, RESPIRATORY W GRAM STAIN

## 2022-03-28 LAB — CBC
HCT: 30.5 % — ABNORMAL LOW (ref 36.0–46.0)
Hemoglobin: 9.9 g/dL — ABNORMAL LOW (ref 12.0–15.0)
MCH: 28.2 pg (ref 26.0–34.0)
MCHC: 32.5 g/dL (ref 30.0–36.0)
MCV: 86.9 fL (ref 80.0–100.0)
Platelets: 244 10*3/uL (ref 150–400)
RBC: 3.51 MIL/uL — ABNORMAL LOW (ref 3.87–5.11)
RDW: 14.4 % (ref 11.5–15.5)
WBC: 16 10*3/uL — ABNORMAL HIGH (ref 4.0–10.5)
nRBC: 0 % (ref 0.0–0.2)

## 2022-03-28 LAB — BASIC METABOLIC PANEL
Anion gap: 8 (ref 5–15)
BUN: 9 mg/dL (ref 6–20)
CO2: 25 mmol/L (ref 22–32)
Calcium: 8.4 mg/dL — ABNORMAL LOW (ref 8.9–10.3)
Chloride: 102 mmol/L (ref 98–111)
Creatinine, Ser: 0.81 mg/dL (ref 0.44–1.00)
GFR, Estimated: >60 mL/min (ref >60)
Glucose, Bld: 107 mg/dL — ABNORMAL HIGH (ref 70–99)
Potassium: 3.5 mmol/L (ref 3.5–5.1)
Sodium: 135 mmol/L (ref 135–145)

## 2022-03-28 MED ORDER — SODIUM CHLORIDE 0.9 % IV SOLN
2.0000 g | INTRAVENOUS | Status: DC
  Administered 2022-03-28 – 2022-03-29 (×2): 2 g via INTRAVENOUS
  Filled 2022-03-28 (×2): qty 20

## 2022-03-28 MED ORDER — MELATONIN 3 MG PO TABS
3.0000 mg | ORAL_TABLET | Freq: Once | ORAL | Status: AC
  Administered 2022-03-29: 3 mg via ORAL
  Filled 2022-03-28: qty 1

## 2022-03-28 MED ORDER — SODIUM CHLORIDE 0.9 % IV BOLUS
500.0000 mL | Freq: Once | INTRAVENOUS | Status: AC
  Administered 2022-03-28: 500 mL via INTRAVENOUS

## 2022-03-28 MED ORDER — CLONAZEPAM 0.1 MG/ML ORAL SUSPENSION: 1.0000 mg | Freq: Once | ORAL | Status: DC

## 2022-03-28 MED ORDER — CLONAZEPAM 0.25 MG PO TBDP
1.0000 mg | ORAL_TABLET | Freq: Once | ORAL | Status: AC
  Administered 2022-03-28: 1 mg
  Filled 2022-03-28: qty 4

## 2022-03-28 MED ORDER — POTASSIUM CHLORIDE 20 MEQ PO PACK
40.0000 meq | PACK | Freq: Once | ORAL | Status: AC
  Administered 2022-03-28: 40 meq
  Filled 2022-03-28: qty 2

## 2022-03-28 MED ORDER — OLANZAPINE 5 MG PO TBDP
5.0000 mg | ORAL_TABLET | Freq: Every day | ORAL | Status: DC
  Administered 2022-03-29: 5 mg via SUBLINGUAL
  Filled 2022-03-28: qty 1

## 2022-03-28 NOTE — Progress Notes (Addendum)
Call to [facility] transfer center at 1241 RE consideration of transfer-- evaluation for diaphragmatic pacemaker, ongoing care. Transfer center to discuss w Market researcher.  Faxing face sheet, power sharing MRI brain and C spine.  Await call back  Received call back this afternoon, no beds  __  [facility] transfer center called at [phone], awaiting call back Called back [phone] from [facility] transfer center + spine surgery to discuss case details. He will discuss case with neuro ICU team regarding transfer feasibility and get back in touch with me. Awaiting return call   After spine surgery discussed case with neuro intensivist, the patient is declined in transfer as it was determined that she would not be a candidate for intervention, and thus transfer for same level of ICU care would not be beneficial    Eliseo Gum MSN, AGACNP-BC [place] 03/28/2022, 12:59 PM

## 2022-03-28 NOTE — Plan of Care (Signed)
  Problem: Nutrition: Goal: Adequate nutrition will be maintained Outcome: Progressing Note: Tube feeding moved to 80m/hr just prior to shift, goal is 70 mL/hr.   Problem: Role Relationship: Goal: Ability to communicate will improve Outcome: Progressing Note: Pt is able to mouth needs and make noises to garner attention to do so.   Problem: Skin Integrity: Goal: Risk for impaired skin integrity will decrease Outcome: Not Progressing Note: Pt refuses mobility, and is unable to make changes in position herself, thus increasing risk of impaired skin integrity.

## 2022-03-28 NOTE — Progress Notes (Signed)
[facility] ADULT ICU REPLACEMENT PROTOCOL   The patient does apply for the [facility] Adult ICU Electrolyte Replacment Protocol based on the criteria listed below:   1.Exclusion criteria: TCTS, ECMO, Dialysis, and Myasthenia Gravis patients 2. Is GFR >/= 30 ml/min? Yes.    Patient's GFR today is >60 3. Is SCr </= 2? Yes.   Patient's SCr is 0.81 mg/dL 4. Did SCr increase >/= 0.5 in 24 hours? No. 5.Pt's weight >40kg  Yes.   6. Abnormal electrolyte(s): K  7. Electrolytes replaced per protocol 8.  Call MD STAT for K+ </= 2.5, Phos </= 1, or Mag </= 1 Physician:  Luci Bank [facility] 03/28/2022 5:40 AM

## 2022-03-28 NOTE — Progress Notes (Addendum)
NAME:  Rachael [name], MRN:  [id number], DOB:  04/17/2000, LOS: 9 ADMISSION DATE:  03/19/2022, CONSULTATION DATE:  03/28/22 REFERRING MD:  drawbridge, CHIEF COMPLAINT:  shortness of breath and weakness   History of Present Illness:  Rachael [name] is a 22 y.o. F with history of depression and suicidal ideation who presented to [place] ED with sudden onset of shortness of breath and upper and lower extremity weakness.  History taken from Epic as patient is intubated and no family is at the bedside.  She initially had a headache and back pain prior to onset but denied abdominal pain or chest pain.  Vitals showed an initial temp of 35F, bloodwork with WBC 18k, UDS + for bezos and THC.   Her symptoms progressed to the point she wasn't speaking and ABG showed hypercarbic respiratory failure so pt was intubated.  She had some bradycardia and hypotension post-intubation, so was started on peripheral Levophed and given atropine during transport   Pertinent  Medical History   has a past medical history of Depression, Hyperlipidemia, and Kidney abscess.   Significant Hospital Events: Including procedures, antibiotic start and stop dates in addition to other pertinent events   2/16 presented to DB with all weakness and shortness of breath requiring intubation and transfer to [facility] 2/18 episodes of desaturations and hypercarbia unexplained 2/19 trach, cortrak, PICC 2/23 therapeutic bronch 2/24 Eikenella corrodens BAL. Not triggering vent  2/25 narrow abx from vanc zosyn to rocephin. Will reach out to OSH re transfer and consideration of diaphragmatic pacemaker    Interim History / Subjective:  NAEO   Objective   Blood pressure (!) 106/54, pulse 88, temperature (!) 101.7 F (38.7 C), resp. rate (!) 26, height '5\' 7"'$  (1.702 m), weight 102.4 kg, SpO2 96 %.    Vent Mode: PRVC FiO2 (%):  [70 %-100 %] 90 % Set Rate:  [26 bmp] 26 bmp Vt Set:  [490 mL] 490 mL PEEP:  [10 cmH20] 10 cmH20 Plateau  Pressure:  [24 cmH20-29 cmH20] 26 cmH20   Intake/Output Summary (Last 24 hours) at 03/28/2022 0829 Last data filed at 03/28/2022 0700 Gross per 24 hour  Intake 3565.51 ml  Output 4214 ml  Net -648.49 ml   Filed Weights   03/25/22 0500 03/27/22 0800 03/28/22 0444  Weight: 100 kg 96.8 kg 102.4 kg   Gen: WDWN young adult F NAD Neuro: Awake alert following commands. Quad  Psych: Appropriate behavior for age and situation. Tearful.  Good insight  HEENT: NCAT trach secure anicteric sclera Pulm: mechanically ventilated, thick secretions CV: rrr cap refill < 3 sec  MSK: PICC LUE  GI: soft ndnt PEG GU: foley    Assessment & Plan:    Goals of care / DNR status  - clear goals of care -- willing to try aggressive care for a couple of months (3) but if no meaningful improvement in functional status this quality of life is not acceptable for the patient and she would want transition to comfort care. Mom and medical team are supportive, and will continue to help facilitate best shot at recovery during this period of time.   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  Acute hypoxic and hypercarbic respiratory failure, mucus plugging, Eikenella corrodens HCAP -- s/p trach  Neurogenic bladder Dysphagia -- s/p PEG  Fevers, leukocytosis  Disrupted sleep cycle  Hx tobacco use, mj use  Situational depression  Anxiety  -really unfortunate situation, etiology thought possible HBC + smoking  -is not triggering vent P -  de-escalate abx, sounds like third gen cephalosporin is good for eikenella so will do rocephin  - cont pulm hygiene, hypertonic   - will add qHS sleep med,  zoloft started this admission --continue and may need to incr -cont TID BZD, adding 1x dose for panic attack  - DAPT, statin -TOC is reaching out to [place] [facility] -I will call to some of our regional tertiary centers, wondering if she might be a candidate for diaphragmatic pacemaker-- does not sound like we do  them here  -PT/OT/SLP  -routine trach care  -DNR     CRITICAL CARE Performed by: Cristal Generous   Total critical care time: 40 minutes  Critical care time was exclusive of separately billable procedures and treating other patients. Critical care was necessary to treat or prevent imminent or life-threatening deterioration.  Critical care was time spent personally by me on the following activities: development of treatment plan with patient and/or surrogate as well as nursing, discussions with consultants, evaluation of patient's response to treatment, examination of patient, obtaining history from patient or surrogate, ordering and performing treatments and interventions, ordering and review of laboratory studies, ordering and review of radiographic studies, pulse oximetry and re-evaluation of patient's condition.   Eliseo Gum MSN, AGACNP-BC [place] for pager  03/28/2022, 8:29 AM

## 2022-03-28 NOTE — Progress Notes (Signed)
Pt endorses feeling nauseaous but would still like to perform CPT. RT explained to pt that it can be held and done at a later time to which pt declined stating she wanted it now. CPT started and RT stayed for duratoin. Pt tolerated well. RT will continue to monitor and be available as needed.

## 2022-03-28 NOTE — Progress Notes (Signed)
eLink Physician-Brief Progress Note Patient Name: Rachael [name] DOB: 01/17/2001 MRN: [id number]   Date of Service  03/28/2022  HPI/Events of Note  Asked for Lorrin Mais and the day team has agreed but there is no order. Can that order be placed, please.   eICU Interventions  Trial of melatonin first, call me back if not better.     Intervention Category Minor Interventions: Routine modifications to care plan (e.g. PRN medications for pain, fever)  Elmer Sow 03/28/2022, 11:21 PM

## 2022-03-29 DIAGNOSIS — Z9911 Dependence on respirator [ventilator] status: Secondary | ICD-10-CM | POA: Diagnosis not present

## 2022-03-29 DIAGNOSIS — Z66 Do not resuscitate: Secondary | ICD-10-CM | POA: Diagnosis not present

## 2022-03-29 DIAGNOSIS — Z515 Encounter for palliative care: Secondary | ICD-10-CM | POA: Diagnosis not present

## 2022-03-29 DIAGNOSIS — H5500 Unspecified nystagmus: Secondary | ICD-10-CM

## 2022-03-29 DIAGNOSIS — R4182 Altered mental status, unspecified: Secondary | ICD-10-CM | POA: Diagnosis not present

## 2022-03-29 DIAGNOSIS — R609 Edema, unspecified: Secondary | ICD-10-CM

## 2022-03-29 DIAGNOSIS — J9601 Acute respiratory failure with hypoxia: Secondary | ICD-10-CM | POA: Diagnosis not present

## 2022-03-29 DIAGNOSIS — Z7189 Other specified counseling: Secondary | ICD-10-CM | POA: Diagnosis not present

## 2022-03-29 LAB — BASIC METABOLIC PANEL
Anion gap: 9 (ref 5–15)
BUN: 8 mg/dL (ref 6–20)
CO2: 26 mmol/L (ref 22–32)
Calcium: 8.6 mg/dL — ABNORMAL LOW (ref 8.9–10.3)
Chloride: 102 mmol/L (ref 98–111)
Creatinine, Ser: 0.73 mg/dL (ref 0.44–1.00)
GFR, Estimated: >60 mL/min (ref >60)
Glucose, Bld: 145 mg/dL — ABNORMAL HIGH (ref 70–99)
Potassium: 3.8 mmol/L (ref 3.5–5.1)
Sodium: 137 mmol/L (ref 135–145)

## 2022-03-29 LAB — CBC
HCT: 29.7 % — ABNORMAL LOW (ref 36.0–46.0)
Hemoglobin: 9.8 g/dL — ABNORMAL LOW (ref 12.0–15.0)
MCH: 29 pg (ref 26.0–34.0)
MCHC: 33 g/dL (ref 30.0–36.0)
MCV: 87.9 fL (ref 80.0–100.0)
Platelets: 235 10*3/uL (ref 150–400)
RBC: 3.38 MIL/uL — ABNORMAL LOW (ref 3.87–5.11)
RDW: 14.6 % (ref 11.5–15.5)
WBC: 15.8 10*3/uL — ABNORMAL HIGH (ref 4.0–10.5)
nRBC: 0 % (ref 0.0–0.2)

## 2022-03-29 LAB — GLUCOSE, CAPILLARY
Glucose-Capillary: 118 mg/dL — ABNORMAL HIGH (ref 70–99)
Glucose-Capillary: 118 mg/dL — ABNORMAL HIGH (ref 70–99)
Glucose-Capillary: 128 mg/dL — ABNORMAL HIGH (ref 70–99)
Glucose-Capillary: 140 mg/dL — ABNORMAL HIGH (ref 70–99)
Glucose-Capillary: 145 mg/dL — ABNORMAL HIGH (ref 70–99)

## 2022-03-29 LAB — MRSA NEXT GEN BY PCR, NASAL: MRSA by PCR Next Gen: NOT DETECTED

## 2022-03-29 LAB — MAGNESIUM: Magnesium: 2 mg/dL (ref 1.7–2.4)

## 2022-03-29 MED ORDER — TRAZODONE HCL 50 MG PO TABS
100.0000 mg | ORAL_TABLET | Freq: Every day | ORAL | Status: DC
  Administered 2022-03-29 – 2022-04-14 (×17): 100 mg
  Filled 2022-03-29 (×17): qty 2

## 2022-03-29 MED ORDER — IBUPROFEN 100 MG/5ML PO SUSP
400.0000 mg | Freq: Once | ORAL | Status: AC
  Administered 2022-03-29: 400 mg
  Filled 2022-03-29: qty 20

## 2022-03-29 MED ORDER — FUROSEMIDE 10 MG/ML IJ SOLN
40.0000 mg | Freq: Three times a day (TID) | INTRAMUSCULAR | Status: AC
  Administered 2022-03-29 – 2022-03-30 (×3): 40 mg via INTRAVENOUS
  Filled 2022-03-29 (×3): qty 4

## 2022-03-29 MED ORDER — POTASSIUM CHLORIDE 20 MEQ PO PACK
40.0000 meq | PACK | Freq: Three times a day (TID) | ORAL | Status: AC
  Administered 2022-03-29 (×2): 40 meq
  Filled 2022-03-29 (×2): qty 2

## 2022-03-29 MED ORDER — VANCOMYCIN HCL 2000 MG/400ML IV SOLN
2000.0000 mg | Freq: Once | INTRAVENOUS | Status: AC
  Administered 2022-03-29: 2000 mg via INTRAVENOUS
  Filled 2022-03-29: qty 400

## 2022-03-29 MED ORDER — OXYCODONE HCL 5 MG PO TABS
5.0000 mg | ORAL_TABLET | Freq: Four times a day (QID) | ORAL | Status: DC | PRN
  Administered 2022-03-29 – 2022-03-30 (×2): 5 mg
  Administered 2022-03-30 – 2022-04-03 (×8): 10 mg
  Administered 2022-04-03: 5 mg
  Administered 2022-04-04 (×3): 10 mg
  Filled 2022-03-29: qty 1
  Filled 2022-03-29 (×4): qty 2
  Filled 2022-03-29: qty 1
  Filled 2022-03-29 (×4): qty 2
  Filled 2022-03-29: qty 1
  Filled 2022-03-29 (×3): qty 2

## 2022-03-29 MED ORDER — SODIUM CHLORIDE 0.9 % IV SOLN
1.0000 g | Freq: Three times a day (TID) | INTRAVENOUS | Status: DC
  Administered 2022-03-29 – 2022-04-06 (×24): 1 g via INTRAVENOUS
  Filled 2022-03-29 (×24): qty 20

## 2022-03-29 MED ORDER — VANCOMYCIN HCL IN DEXTROSE 1-5 GM/200ML-% IV SOLN
1000.0000 mg | Freq: Three times a day (TID) | INTRAVENOUS | Status: DC
  Administered 2022-03-29 – 2022-04-02 (×11): 1000 mg via INTRAVENOUS
  Filled 2022-03-29 (×11): qty 200

## 2022-03-29 MED ORDER — ZOLPIDEM TARTRATE 5 MG PO TABS
5.0000 mg | ORAL_TABLET | Freq: Every evening | ORAL | Status: DC | PRN
  Filled 2022-03-29: qty 1

## 2022-03-29 NOTE — Progress Notes (Signed)
eLink Physician-Brief Progress Note Patient Name: Rachael [name] DOB: August 02, 2000 MRN: [id number]   Date of Service  03/29/2022  HPI/Events of Note  Patient request for popsicles - Nursing reports that patient is able to swallow water without difficulty.   eICU Interventions  Patient may have popsicles.     Intervention Category Major Interventions: Other:  Lysle Dingwall 03/29/2022, 8:44 PM

## 2022-03-29 NOTE — Progress Notes (Signed)
Daily Progress Note   Patient Name: Rachael [name]       Date: 03/29/2022 DOB: 10-27-2000  Age: 22 y.o. MRN#: [id number] Attending Physician: Julian Hy, DO Primary Care Physician: Patient, No Pcp Per Admit Date: 03/19/2022 Length of Stay: 10 days  Reason for Consultation/Follow-up: Establishing goals of care  HPI/Patient Profile:  22 y.o. female  with past medical history of depression and suicidal ideation who presented to [place] ED with sudden onset of shortness of breath and upper and lower extremity weakness. ED work-up found loss of speaking ability and hypercarbic respiratory failure and was subsequently intubated and transferred to [facility].  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    PMT was consulted for [place] conversations.  Subjective:   Subjective: Chart Reviewed. Updates received. Patient Assessed. Created space and opportunity for patient  and family to explore thoughts and feelings regarding current medical situation.  Today's Discussion: I saw the patient at the bedside.  She seemed quite sleepy and initially I did not attempt to wake her.  However, when I was examining the ventilator for settings she seemed to wake up and shortly thereafter her mother arrived.  Even then she was in and out of sleep.  She states she is doing okay today.  She did request suctioning and the nurse performed trach/vent inline suctioning with noted very thick secretions.  Patient's mother states that she is doing okay.  If she does end up transferring to another facility she will work it out to where she will be there with her.  Work has been very cooperative with the situation.  We also celebrated that the patient was able to see her 31-year-old child this weekend.  When asked her  about that, she smiled.  Discussed case with nursing and during multidisciplinary rounds.  There is a request out to [facility] for evaluation for diaphragmatic pacer but no beds available.  [facility] has declined transfer with the same.  Also working to see if there is possibility of placement at [facility] in [place].  PEG tube placed end of last week, has Foley, trach, Flexi-Seal.  Vent settings currently PRVC with FiO2 of 80%, PEEP of 10, respiratory rate 26, tidal volumes 490.  Tube feedings are running via PEG at 70 mL/h.  Vitals appear stable at this time, satting 93%.  I told him I would check back in a few days to see how they are doing.  At any point we are available to come back if needed before then.  I provided emotional and general support through therapeutic listening, empathy, sharing of stories, and other techniques. I answered all questions and addressed all concerns to the best of my ability.  Review of Systems  Respiratory:  Negative for cough and shortness of breath.        Frequent secretions and asking for suctioning  Gastrointestinal:  Negative for abdominal pain, nausea and vomiting.    Objective:   Vital Signs:  BP (!) 103/57   Pulse (!) 104   Temp (!) 101.5 F (38.6 C)   Resp (!) 26   Ht '5\' 7"'$  (1.702 m)   Wt 97.9 kg   SpO2 93%   BMI 33.80 kg/m   Physical Exam: Physical Exam Vitals and nursing note reviewed.  Constitutional:      General: She is  sleeping. She is not in acute distress. HENT:     Head: Normocephalic and atraumatic.  Cardiovascular:     Rate and Rhythm: Normal rate.  Pulmonary:     Effort: Pulmonary effort is normal. No respiratory distress.  Abdominal:     General: Abdomen is flat.     Palpations: Abdomen is soft.  Skin:    General: Skin is warm and dry.  Neurological:     Mental Status: She is easily aroused.     Comments: Functional quadraplegia  Psychiatric:        Mood and Affect: Mood normal.        Behavior:  Behavior normal.     Palliative Assessment/Data: 20-30% (tube feedings)    Existing Vynca/ACP Documentation: None  Assessment & Plan:   Impression: Present on Admission:  Altered mental state  SUMMARY OF RECOMMENDATIONS   DNR Continue full scope of care otherwise Goal of attempting to get some form of improvement Allow time for outcomes Awaiting answer from [facility] on possible transfer PMT will continue to follow every few days to once a week Please contact PMT for any significant changes or palliative needs over the weekend  Symptom Management:  Per primary team PMT will continue to follow  Code Status: DNR  Prognosis: Unable to determine  Discharge Planning: To Be Determined  Discussed with: Patient, medical team, nursing team, patient's family  Thank you for allowing [place] to participate in the care of Rachael [name] PMT will continue to support holistically.  Billing based on MDM: High  Problems Addressed: One acute or chronic illness or injury that poses a threat to life or bodily function  Amount and/or Complexity of Data: Category 3:Discussion of management or test interpretation with external physician/other qualified health care professional/appropriate source (not separately reported)  Risks: Decision not to resuscitate or to de-escalate care because of poor prognosis   Walden Field, NP Palliative Medicine Team  Team Phone # [phone] (Nights/Weekends)  09/30/2020, 8:17 AM

## 2022-03-29 NOTE — Progress Notes (Signed)
Respiratory culture obtained and sent to main lab at this time.

## 2022-03-29 NOTE — Progress Notes (Signed)
OT received new order, pt already on caseload, will continue to follow.  [place] Office [phone]

## 2022-03-29 NOTE — TOC Progression Note (Signed)
Transition of Care [facility]) - Progression Note    Patient Details  Name: [name] MRN: [id number] Date of Birth: 05-Jun-2000  Transition of [facility]) CM/SW Contact  Erenest Rasher, RN Phone Number: [phone] 03/29/2022, 1:54 PM  Clinical Narrative:     Received call from [facility] in [place], [place] and they do not contract with [place] [employer]. Updated attending.   Expected Discharge Plan: IP Rehab Facility Barriers to Discharge: Continued Medical Work up  Expected Discharge Plan and Services   Discharge Planning Services: CM Consult   Living arrangements for the past 2 months: Single Family Home                                       Social Determinants of Health (SDOH) Interventions SDOH Screenings   Food Insecurity: No Food Insecurity (03/23/2022)  Housing: Low Risk  (03/23/2022)  Transportation Needs: No Transportation Needs (03/23/2022)  Utilities: Not At Risk (03/23/2022)  Tobacco Use: Medium Risk (03/26/2022)    Readmission Risk Interventions     No data to display

## 2022-03-29 NOTE — Progress Notes (Signed)
Patient educated on importance of allowing care team to turn and reposition her every 2 hours.

## 2022-03-29 NOTE — Progress Notes (Addendum)
NAME:  Rachael [name], MRN:  [id number], DOB:  09/26/2000, LOS: 2 ADMISSION DATE:  03/19/2022, CONSULTATION DATE:  03/29/22 REFERRING MD:  drawbridge, CHIEF COMPLAINT:  shortness of breath and weakness   History of Present Illness:  Rachael [name] is a 22 y.o. F with history of depression and suicidal ideation who presented to [place] ED with sudden onset of shortness of breath and upper and lower extremity weakness.  History taken from Epic as patient is intubated and no family is at the bedside.  She initially had a headache and back pain prior to onset but denied abdominal pain or chest pain.  Vitals showed an initial temp of 8F, bloodwork with WBC 18k, UDS + for bezos and THC.   Her symptoms progressed to the point she wasn't speaking and ABG showed hypercarbic respiratory failure so pt was intubated.  She had some bradycardia and hypotension post-intubation, so was started on peripheral Levophed and given atropine during transport   Pertinent  Medical History   has a past medical history of Depression, Hyperlipidemia, and Kidney abscess.   Significant Hospital Events: Including procedures, antibiotic start and stop dates in addition to other pertinent events   2/16 presented to DB with all weakness and shortness of breath requiring intubation and transfer to [facility] 2/18 episodes of desaturations and hypercarbia unexplained 2/19 trach, cortrak, PICC 2/23 therapeutic bronch 2/24 Eikenella corrodens BAL. Not triggering vent  2/25 narrow abx from vanc zosyn to rocephin. Will reach out to OSH re transfer and consideration of diaphragmatic pacemaker - unfortunately no beds at [facility] and declined transfer at [facility]  Interim History / Subjective:  NAEON.  Secretions ongoing, able to be suctioned, still thick.  Asking for Ambien to help with sleep.  Objective   Blood pressure 114/66, pulse 67, temperature (!) 100.4 F (38 C), resp. rate (!) 26, height '5\' 7"'$  (1.702 m), weight 97.9 kg, SpO2  99 %.    Vent Mode: PRVC FiO2 (%):  [85 %-95 %] 95 % Set Rate:  [26 bmp] 26 bmp Vt Set:  [490 mL] 490 mL PEEP:  [10 cmH20] 10 cmH20 Plateau Pressure:  [24 cmH20-26 cmH20] 24 cmH20   Intake/Output Summary (Last 24 hours) at 03/29/2022 0735 Last data filed at 03/29/2022 0600 Gross per 24 hour  Intake 2333.85 ml  Output 4112 ml  Net -1778.15 ml    Filed Weights   03/27/22 0800 03/28/22 0444 03/29/22 0500  Weight: 96.8 kg 102.4 kg 97.9 kg   Gen: WDWN young adult F NAD Neuro: Awake alert following commands. Quad  Psych: Appropriate behavior for age and situation. Tearful.  Good insight  HEENT: NCAT trach secure anicteric sclera Pulm: mechanically ventilated, thick secretions CV: rrr cap refill < 3 sec  MSK: PICC LUE  GI: soft ndnt PEG GU: foley    Assessment & Plan:    Goals of care / DNR status  - clear goals of care -- willing to try aggressive care for a couple of months (3) but if no meaningful improvement in functional status this quality of life is not acceptable for the patient and she would want transition to comfort care. Mom and medical team are supportive, and will continue to help facilitate best shot at recovery during this period of time.   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  Acute hypoxic and hypercarbic respiratory failure, mucus plugging, Eikenella corrodens HCAP -- s/p trach  Neurogenic bladder Dysphagia -- s/p PEG  Fevers, leukocytosis  Disrupted sleep cycle  Hx  tobacco use, mj use  Situational depression  Anxiety  -really unfortunate situation, etiology thought possible HBC + smoking  -is not triggering vent P - Continue full vent support, hasn't tolerated weaning and currently too high of support to try - Continue CTX - Continue pulm hygiene, hypertonic nebs - Continue Zoloft - Add QHS Ambien low dose - Continue TID BZD, adding 1x dose for panic attack  - DAPT, statin - TOC is reaching out to [place] [facility] - PCCM attempted  calls to [facility] and [facility] 2/25 for consideration transfer for diaphragmatic pacemaker and ongoing care, unfortuantely no beds [facility] and declined at [facility]. - PT/OT/SLP  - Routine trach care  - DNR, see goals of care above  CC time: 30 min.   Montey Hora, [place] [facility] For pager details, please see AMION or use Epic chat  After 1900, please call [facility] for cross coverage needs 03/29/2022, 7:46 AM

## 2022-03-29 NOTE — Progress Notes (Signed)
Pharmacy Antibiotic Note  Rachael [name] is a 22 y.o. 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 Intubated, sedated and started on Unasyn for pneumonia > abx broadened to vancomycin and zosyn 2/23> change to Ceftriaxone 2/25 with EIKENELLA CORRODENS on bronch Cx> continues with fever 101.5 and inc wbc 16 and secretions -not on steroids.  Pharmacy has been consulted for vancomycin and meropenem dosing. Cr < 1 CrCl 130 est AUC400. And will monitor fever curve, cx data and s/s infection  Plan: Vancoycin 2gm x1 then 1gm IV q8h Meropenem 1gm IV q8h    Height: '5\' 7"'$  (170.2 cm) Weight: 97.9 kg (215 lb 13.3 oz) IBW/kg (Calculated) : 61.6  Temp (24hrs), Avg:100.7 F (38.2 C), Min:98.6 F (37 C), Max:101.7 F (38.7 C)  Recent Labs  Lab 03/25/22 0332 03/26/22 0443 03/27/22 0442 03/28/22 0423 03/29/22 0532  WBC 11.7* 14.6* 16.7* 16.0* 15.8*  CREATININE 1.74* 0.68 0.77 0.81 0.73     Estimated Creatinine Clearance: 133.6 mL/min (by C-G formula based on SCr of 0.73 mg/dL).    No Known Allergies  Antimicrobials this admission: Unasyn 2/21>2/23 Vanc 2/23>2/25  restart 2/26 Zosyn 2/23>2/25 Ceftriax 2/25>2/26 Meropenem 2/26> Dose adjustments this admission:   Microbiology results: 2/23 Bronch EIKENELLA CORRODENS  2/19 TA ngtd 2/16 BCx: ngtd 2/16 MRSA PCR: neg    Bonnita Nasuti Pharm.D. CPP, BCPS Clinical Pharmacist [phone] 03/29/2022 1:31 PM   03/29/2022 1:31 PM

## 2022-03-30 ENCOUNTER — Inpatient Hospital Stay (HOSPITAL_COMMUNITY): Payer: Medicaid Other

## 2022-03-30 DIAGNOSIS — J9601 Acute respiratory failure with hypoxia: Secondary | ICD-10-CM | POA: Diagnosis not present

## 2022-03-30 DIAGNOSIS — J189 Pneumonia, unspecified organism: Secondary | ICD-10-CM | POA: Diagnosis not present

## 2022-03-30 DIAGNOSIS — Z9911 Dependence on respirator [ventilator] status: Secondary | ICD-10-CM | POA: Diagnosis not present

## 2022-03-30 DIAGNOSIS — G839 Paralytic syndrome, unspecified: Secondary | ICD-10-CM | POA: Diagnosis not present

## 2022-03-30 LAB — POCT I-STAT EG7
Acid-Base Excess: 9 mmol/L — ABNORMAL HIGH (ref 0.0–2.0)
Bicarbonate: 35.4 mmol/L — ABNORMAL HIGH (ref 20.0–28.0)
Calcium, Ion: 1.24 mmol/L (ref 1.15–1.40)
HCT: 30 % — ABNORMAL LOW (ref 36.0–46.0)
Hemoglobin: 10.2 g/dL — ABNORMAL LOW (ref 12.0–15.0)
O2 Saturation: 75 %
Patient temperature: 98
Potassium: 4 mmol/L (ref 3.5–5.1)
Sodium: 135 mmol/L (ref 135–145)
TCO2: 37 mmol/L — ABNORMAL HIGH (ref 22–32)
pCO2, Ven: 56.7 mmHg (ref 44–60)
pH, Ven: 7.402 (ref 7.25–7.43)
pO2, Ven: 40 mmHg (ref 32–45)

## 2022-03-30 LAB — CBC WITH DIFFERENTIAL/PLATELET
Abs Immature Granulocytes: 0.23 10*3/uL — ABNORMAL HIGH (ref 0.00–0.07)
Basophils Absolute: 0.1 10*3/uL (ref 0.0–0.1)
Basophils Relative: 0 %
Eosinophils Absolute: 0.5 10*3/uL (ref 0.0–0.5)
Eosinophils Relative: 3 %
HCT: 28.9 % — ABNORMAL LOW (ref 36.0–46.0)
Hemoglobin: 9.1 g/dL — ABNORMAL LOW (ref 12.0–15.0)
Immature Granulocytes: 1 %
Lymphocytes Relative: 11 %
Lymphs Abs: 2.2 10*3/uL (ref 0.7–4.0)
MCH: 28.1 pg (ref 26.0–34.0)
MCHC: 31.5 g/dL (ref 30.0–36.0)
MCV: 89.2 fL (ref 80.0–100.0)
Monocytes Absolute: 2.3 10*3/uL — ABNORMAL HIGH (ref 0.1–1.0)
Monocytes Relative: 12 %
Neutro Abs: 14.1 10*3/uL — ABNORMAL HIGH (ref 1.7–7.7)
Neutrophils Relative %: 73 %
Platelets: 247 10*3/uL (ref 150–400)
RBC: 3.24 MIL/uL — ABNORMAL LOW (ref 3.87–5.11)
RDW: 14.6 % (ref 11.5–15.5)
WBC: 19.4 10*3/uL — ABNORMAL HIGH (ref 4.0–10.5)
nRBC: 0 % (ref 0.0–0.2)

## 2022-03-30 LAB — COMPREHENSIVE METABOLIC PANEL
ALT: 76 U/L — ABNORMAL HIGH (ref 0–44)
AST: 24 U/L (ref 15–41)
Albumin: 2.3 g/dL — ABNORMAL LOW (ref 3.5–5.0)
Alkaline Phosphatase: 61 U/L (ref 38–126)
Anion gap: 8 (ref 5–15)
BUN: 10 mg/dL (ref 6–20)
CO2: 28 mmol/L (ref 22–32)
Calcium: 8.3 mg/dL — ABNORMAL LOW (ref 8.9–10.3)
Chloride: 98 mmol/L (ref 98–111)
Creatinine, Ser: 0.75 mg/dL (ref 0.44–1.00)
GFR, Estimated: >60 mL/min (ref >60)
Glucose, Bld: 138 mg/dL — ABNORMAL HIGH (ref 70–99)
Potassium: 4.4 mmol/L (ref 3.5–5.1)
Sodium: 134 mmol/L — ABNORMAL LOW (ref 135–145)
Total Bilirubin: 0.4 mg/dL (ref 0.3–1.2)
Total Protein: 5.7 g/dL — ABNORMAL LOW (ref 6.5–8.1)

## 2022-03-30 LAB — GLUCOSE, CAPILLARY
Glucose-Capillary: 100 mg/dL — ABNORMAL HIGH (ref 70–99)
Glucose-Capillary: 141 mg/dL — ABNORMAL HIGH (ref 70–99)
Glucose-Capillary: 150 mg/dL — ABNORMAL HIGH (ref 70–99)
Glucose-Capillary: 164 mg/dL — ABNORMAL HIGH (ref 70–99)
Glucose-Capillary: 186 mg/dL — ABNORMAL HIGH (ref 70–99)
Glucose-Capillary: 198 mg/dL — ABNORMAL HIGH (ref 70–99)

## 2022-03-30 LAB — PHOSPHORUS: Phosphorus: 4.4 mg/dL (ref 2.5–4.6)

## 2022-03-30 LAB — MAGNESIUM: Magnesium: 2 mg/dL (ref 1.7–2.4)

## 2022-03-30 MED ORDER — JEVITY 1.5 CAL/FIBER PO LIQD
355.0000 mL | Freq: Four times a day (QID) | ORAL | Status: DC
  Administered 2022-03-30 – 2022-03-31 (×3): 355 mL
  Filled 2022-03-30 (×5): qty 474

## 2022-03-30 MED ORDER — FUROSEMIDE 10 MG/ML IJ SOLN
40.0000 mg | Freq: Two times a day (BID) | INTRAMUSCULAR | Status: AC
  Administered 2022-03-30 (×2): 40 mg via INTRAVENOUS
  Filled 2022-03-30 (×2): qty 4

## 2022-03-30 MED ORDER — SODIUM CHLORIDE 3 % IN NEBU
4.0000 mL | INHALATION_SOLUTION | RESPIRATORY_TRACT | Status: DC
  Administered 2022-03-30 – 2022-04-13 (×80): 4 mL via RESPIRATORY_TRACT
  Filled 2022-03-30 (×93): qty 4

## 2022-03-30 MED ORDER — SODIUM CHLORIDE 3 % IN NEBU
4.0000 mL | INHALATION_SOLUTION | Freq: Every day | RESPIRATORY_TRACT | Status: DC
  Filled 2022-03-30: qty 4

## 2022-03-30 MED ORDER — POTASSIUM CHLORIDE 20 MEQ PO PACK
20.0000 meq | PACK | Freq: Two times a day (BID) | ORAL | Status: DC
  Administered 2022-03-30 (×2): 20 meq
  Filled 2022-03-30 (×2): qty 1

## 2022-03-30 MED ORDER — FREE WATER: 150.0000 mL | Freq: Four times a day (QID) | Status: DC

## 2022-03-30 MED ORDER — FREE WATER
100.0000 mL | Freq: Four times a day (QID) | Status: DC
  Administered 2022-03-30 – 2022-03-31 (×3): 100 mL

## 2022-03-30 MED ORDER — GADOBUTROL 1 MMOL/ML IV SOLN
9.5000 mL | Freq: Once | INTRAVENOUS | Status: AC | PRN
  Administered 2022-03-30: 9.5 mL via INTRAVENOUS

## 2022-03-30 NOTE — Progress Notes (Signed)
Speech Language Pathology Treatment: Rachael [name] Speaking valve  Patient Details Name: Rachael [name] MRN: [id number] DOB: Feb 25, 2000 Today's Date: 03/30/2022 Time: [id number] SLP Time Calculation (min) (ACUTE ONLY): 16 min  Assessment / Plan / Recommendation Clinical Impression  Pt seen in conjunction with RT with pt on full vent support. RT deflated cuff slowly, turned PEEP to 0 with adequate drop in exhaled volume to indicate patent airway. Pt immediately able to vocalize in sentences with adequate intensity quality and says "I don't like it, it is hard to breathe" and it "is forcing air out of my nose and mouth." RT decreased her rate from 26 to 16 then 12 and pt seemed to calm a bit but stated "it is still hard, I don't like it and if this is how it has to be, I'd rather not talk." Her vitals were stable with HR 93, Sp02 98% and RR 12. Therapist provided empathetic listening and encouragement with cues to help coordinate her respirations and phonation. ST will continue working with pt towards goals.    HPI HPI: Rachael [name] is a 22 y.o. female with PMH significant for depression, HLD, Kidney abscess who presents with acute arms and leg weakness, dyspnea and hypercabia.  Pt was intubated on 2/16.   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      SLP Plan  Continue with current plan of care      Recommendations for follow up therapy are one component of a multi-disciplinary discharge planning process, led by the attending physician.  Recommendations may be updated based on patient status, additional functional criteria and insurance authorization.    Recommendations         Patient may use Passy-Muir Speech Valve: with SLP only PMSV Supervision: Full         Oral Care Recommendations: Oral care QID Follow Up Recommendations:  (TBD) Assistance recommended at discharge: Frequent or constant  Supervision/Assistance SLP Visit Diagnosis: Aphonia (R49.1) Plan: Continue with current plan of care           Houston Siren  03/30/2022, 10:29 AM

## 2022-03-30 NOTE — Progress Notes (Signed)
RT at bedside to assist SPT with PMV trial on vent. Trach cuff slowly deflated, trach suctioned, peep decreased from 10 to 0. PMV placed inline. Patient states she does not like to use the PMV because it forces air to come up though her nose and mouth. Says she would prefer not to talk again.  RT decreased vent set RR from 26 to 12 to lessen the forced air feeling. Patient's vitals and sats remained stable throughout. After PMV removed, trach cuff re-inflated, peep increased back to 10, RR increased back to 26.

## 2022-03-30 NOTE — Progress Notes (Signed)
NAME:  Rachael [name], MRN:  [id number], DOB:  06-17-2000, LOS: 1 ADMISSION DATE:  03/19/2022, CONSULTATION DATE:  03/30/22 REFERRING MD:  drawbridge, CHIEF COMPLAINT:  shortness of breath and weakness   History of Present Illness:  Rachael [name] is a 22 y.o. F with history of depression and suicidal ideation who presented to [facility] ED with sudden onset of shortness of breath and upper and lower extremity weakness.  History taken from Epic as patient is intubated and no family is at the bedside.  She initially had a headache and back pain prior to onset but denied abdominal pain or chest pain.  Vitals showed an initial temp of 62F, bloodwork with WBC 18k, UDS + for bezos and THC.   Her symptoms progressed to the point she wasn't speaking and ABG showed hypercarbic respiratory failure so pt was intubated.  She had some bradycardia and hypotension post-intubation, so was started on peripheral Levophed and given atropine during transport   Pertinent  Medical History   has a past medical history of Depression, Hyperlipidemia, and Kidney abscess.   Significant Hospital Events: Including procedures, antibiotic start and stop dates in addition to other pertinent events   2/16 presented to [facility] with all weakness and shortness of breath requiring intubation and transfer to [facility] 2/18 episodes of desaturations and hypercarbia unexplained 2/19 trach, cortrak, PICC 2/23 therapeutic bronch 2/24 Eikenella corrodens BAL. Not triggering vent  2/25 narrow abx from vanc zosyn to rocephin. Will reach out to OSH re transfer and consideration of diaphragmatic pacemaker - unfortunately no beds at [facility] and declined transfer [facility]  Interim History / Subjective:  NAEON. Feels tired this AM. Continues to have fevers. Had repeat sputum culture yesterday. Abx broadened to vanc/merrem Great response to lasix yesterday 40 TID with 5.6L UOP. Will repeat BID today.  Objective   Blood pressure 111/60, pulse 82,  temperature (!) 101.5 F (38.6 C), resp. rate (!) 26, height '5\' 7"'$  (1.702 m), weight 97.8 kg, SpO2 97 %.    Vent Mode: PRVC FiO2 (%):  [80 %-95 %] 80 % Set Rate:  [26 bmp] 26 bmp Vt Set:  [490 mL] 490 mL PEEP:  [10 cmH20] 10 cmH20 Plateau Pressure:  [23 cmH20-27 cmH20] 25 cmH20   Intake/Output Summary (Last 24 hours) at 03/30/2022 0742 Last data filed at 03/30/2022 [id number] Gross per 24 hour  Intake 3045.15 ml  Output 5882 ml  Net -2836.85 ml    Filed Weights   03/28/22 0444 03/29/22 0500 03/30/22 0500  Weight: 102.4 kg 97.9 kg 97.8 kg   Gen: WDWN young adult F NAD Neuro: Sleepy but awakens easily to voice and mouths words. Quad  Psych: Appropriate behavior for age and situation. HEENT: NCAT trach secure anicteric sclera Pulm: mechanically ventilated, clear bilaterally CV: RRR, no M/R/G MSK: PICC LUE  GI:  PEG C/D/I GU: foley    Assessment & Plan:    Goals of care / DNR status  - clear goals of care -- willing to try aggressive care for a couple of months (3) but if no meaningful improvement in functional status this quality of life is not acceptable for the patient and she would want transition to comfort care. Mom and medical team are supportive, and will continue to help facilitate best shot at recovery during this period of time.   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  Acute hypoxic and hypercarbic respiratory failure, mucus plugging, Eikenella corrodens HCAP -- s/p trach  Volume overload Neurogenic bladder Dysphagia --  s/p PEG  Fevers, leukocytosis  Disrupted sleep cycle  Hx tobacco use, mj use  Situational depression  Anxiety  -really unfortunate situation, etiology thought possible HBC + smoking  -is not triggering vent P - Continue full vent support, hasn't tolerated weaning and continues to require high support to try - Continue Vanc/Merrem (escalated 2/26 from CTX) - Follow cultures - Continue pulm hygiene, hypertonic nebs - Responded well  to lasix yesterday, repeat today x 2 doses - Continue Zoloft - Continue Melatonin, Trazodone, Ambien PRN - Avoid unnecessary nighttime baths/waking - Continue TID BZD, adding 1x dose for panic attack  - DAPT, statin - TOC is reaching out to [place] center - PCCM attempted calls to [facility] and [facility] 2/25 for consideration transfer for diaphragmatic pacemaker and ongoing care, unfortuantely no beds [facility] and declined at [facility]. - PT/OT/SLP  - Routine trach care  - DNR, see goals of care above  CC time: 30 min.   Montey Hora, [facility] For pager details, please see AMION or use Epic chat  After 1900, please call [facility] for cross coverage needs 03/30/2022, 7:42 AM

## 2022-03-30 NOTE — Progress Notes (Signed)
Physical Therapy Treatment Patient Details Name: Rachael [name] MRN: [id number] DOB: March 07, 2000 Today's Date: 03/30/2022   History of Present Illness Rachael [name] is a 22 y.o. female admitted 03/19/22 with acute arms and leg weakness, dyspnea and hypercabia.  (+) for polysubstances. Pt was intubated on 2/16.   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 Pt is A&Ox4 and can mouth words efficiently and express her needs. Pt tolerated chair position in bed at 45 degrees for 12 min, when increased towards 60 deg HOB elevation pt with noted bilat nystagmus in eyes with report of dizziness. Pt also with c/o of being hot. Worked on maintaining head in neutral position with HOB at 50 deg. Acute PT to cont to follow.    Recommendations for follow up therapy are one component of a multi-disciplinary discharge planning process, led by the attending physician.  Recommendations may be updated based on patient status, additional functional criteria and insurance authorization.  Follow Up Recommendations  Skilled nursing-short term rehab (<3 hours/day) Can patient physically be transported by private vehicle: No   Assistance Recommended at Discharge Frequent or constant Supervision/Assistance  Patient can return home with the following Two people to help with walking and/or transfers;Two people to help with bathing/dressing/bathroom;Assistance with cooking/housework;Assistance with feeding;Direct supervision/assist for medications management;Direct supervision/assist for financial management;Assist for transportation;Help with stairs or ramp for entrance   Equipment Recommendations  Hospital bed;Wheelchair (measurements PT);Wheelchair cushion (measurements PT);Other  (comment) (tilt in space power chair, hoyer)    Recommendations for Other Services       Precautions / Restrictions Precautions Precautions: Other (comment) Precaution Comments: Trach, vent, cortrak, quadriplegia, flexiseal Restrictions Weight Bearing Restrictions: No     Mobility  Bed Mobility Overal bed mobility: Needs Assistance Bed Mobility: Rolling           General bed mobility comments: totalAx2 for rolling for repositioning    Transfers                   General transfer comment: total assist will need lift, used chair position in bed, HOB increased to 55 deg, +nystagmus in bilat eyes with report of dizziness, able to tolerate at 45 deg    Ambulation/Gait                   Stairs             Wheelchair Mobility    Modified Rankin (Stroke Patients Only)       Balance Overall balance assessment: Needs assistance     Sitting balance - Comments: pt able to sit at 45  degrees without lean once positioned in midline, physical assist to move head and neck, once raised above 45 degrees pt with noted bilat eye nystagmus and report of dizziness                                    Cognition Arousal/Alertness: Awake/alert Behavior During Therapy: Flat affect Overall Cognitive Status: Within Functional Limits for tasks assessed                                 General  Comments: responding appropriately to all questions with mouthing words, able to voice needs        Exercises General Exercises - Upper Extremity Shoulder Flexion: PROM, Both, Seated, 10 reps Shoulder ABduction: PROM, Both, Seated, 10 reps Elbow Flexion: PROM, Both, Seated, 10 reps Elbow Extension: PROM, Both, Seated, 10 reps Wrist Flexion: PROM, Both, Seated, 10 reps Wrist Extension: PROM, Both, Seated, 10 reps General Exercises - Lower Extremity Ankle Circles/Pumps: PROM, Both, 10 reps, Seated Short Arc Quad: PROM, Both, Seated, 10  reps Hip ABduction/ADduction: PROM, Both, Seated, 10 reps Hip Flexion/Marching: PROM, Both, Seated, 10 reps Low Level/ICU Exercises Ankle Circles/Pumps: PROM, Right, Left, Both, 10 reps Hip ABduction/ADduction: PROM, Right, Left, Both, 10 reps Heel Slides: PROM, Right, Left, Both, 10 reps Shoulder Flexion: PROM, Right, Left, 10 reps, Supine Elbow Flexion: PROM, Right, Left, 10 reps, Supine    General Comments General comments (skin integrity, edema, etc.): VSS, pt with report of being hot, noted temperature up      Pertinent Vitals/Pain Pain Assessment Pain Assessment: No/denies pain    Home Living                          Prior Function            PT Goals (current goals can now be found in the care plan section) Acute Rehab PT Goals PT Goal Formulation: With patient/family Time For Goal Achievement: 04/02/22 Potential to Achieve Goals: Poor Progress towards PT goals: Not progressing toward goals - comment    Frequency    Min 2X/week      PT Plan Current plan remains appropriate    Co-evaluation              AM-PAC PT "6 Clicks" Mobility   Outcome Measure  Help needed turning from your back to your side while in a flat bed without using bedrails?: Total Help needed moving from lying on your back to sitting on the side of a flat bed without using bedrails?: Total Help needed moving to and from a bed to a chair (including a wheelchair)?: Total Help needed standing up from a chair using your arms (e.g., wheelchair or bedside chair)?: Total Help needed to walk in hospital room?: Total Help needed climbing 3-5 steps with a railing? : Total 6 Click Score: 6    End of Session Equipment Utilized During Treatment: Oxygen (80% FiO2, 10 of Peep) Activity Tolerance: Patient tolerated treatment well Patient left: in bed;with call bell/phone within reach;with nursing/sitter in room;with family/visitor present Nurse Communication: Mobility status;Need for  lift equipment PT Visit Diagnosis: Muscle weakness (generalized) (M62.81);Other symptoms and signs involving the nervous system (R29.898)     Time: [id number] PT Time Calculation (min) (ACUTE ONLY): 28 min  Charges:  $Therapeutic Exercise: 23-37 mins                     Kittie Plater, PT, DPT Acute Rehabilitation Services Secure chat preferred Office #: [phone]    Berline Lopes 03/30/2022, 9:25 AM

## 2022-03-30 NOTE — Progress Notes (Signed)
Patient transported from 2H02 to MRI and back with no complications noted.

## 2022-03-30 NOTE — Progress Notes (Signed)
Pt transferred to CT and back to room 2H 02 with no events to report.

## 2022-03-30 NOTE — Progress Notes (Signed)
Nutrition Follow-up  DOCUMENTATION CODES:   Not applicable  INTERVENTION:   Discussed trial of bolus feedings with pt and pt's mother. Hoping that larger volume at one time will help improve pt's feeling of hunger (mimics normal pattern of eating several meals/snacks through  out the day) and hoping it will also improve stool consistency/frequency. Discussed plan with Dr. Carlis Abbott as well  Tube Feeding via PEG: Trial of bolus feedings Jevity 1.5 355 mL (1.5 cartons) QID (total of 6 cartons per day) Free water flush of 50 mL before and after each bolus This provides 2133 kcals, 90 of protein and 1480 mL of free water  Recommend decreasing free water flush to 150 mL q 6 hours for now; total free water of 2080 mL (around 1 mL per kcal). Continue to assess  Continue Banatrol TF BID, pt currently on fiber-containing standard formula; hopefully transition to bolus feedings will improve stool consistency/frequency so that rectal tube can be removed  NUTRITION DIAGNOSIS:   Inadequate oral intake related to inability to eat as evidenced by NPO status.  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   PMH includes chronic prescription opioid and benzo use, depression with SI, HLD, kidney abscess. Pt is daily smoker; EtOH abuse, daily drinker-at least half pint  2/16 Admitted 2/19 Trach placed, Cortrak placed (distal stomach vs proximal duodenum) 2/23 Bronch: L mainstem mucous plugging, thick secretions, IR  placed PEG tube 2/24 TF resumed via PEG, changed formula to Jevity  Pt alert on visit today, remains on vent support via trach. TF on hold, getting ready to go down to CT chest. Pt denies any GI issues such as N/V or abdominal pain. Pt does complain of being hungry.  Jevity 1.2 started via PEG on 2/24; appears to be tolerating well at goal rate  SLP continues to work with pt re: PMV, per report pt does not like wearing the PMV.  Pt working   +liquid stool via rectal tube, Banatrol TF 60 mL BID started 2/23. Goal is to thicken consistency enough so that rectal tube can be removed  Current wt 97.8 kg, weight has fluctuated up and down since admission.   Labs: sodium 135 (wdl), Creatinine wdl Meds: lasix, MVI, KCl, thiamine, folic acid, [id number]    Diet Order:   Diet Order             Diet NPO time specified Except for: Sips with Meds, Other (See Comments)  Diet effective midnight                   EDUCATION NEEDS:   Not appropriate for education at this time  Skin:  Skin Assessment: Skin Integrity Issues: Skin Integrity Issues:: Incisions, Other (Comment) Incisions: new PEG tube Other: MASD to buttocks  Last BM:  2/27 type 7 rectal tuber  Height:   Ht Readings from Last 1 Encounters:  03/22/22 '5\' 7"'$  (1.702 m)    Weight:   Wt Readings from Last 1 Encounters:  03/30/22 97.8 kg  Ideal Body Weight:  61.4 kg  BMI:  Body mass index is 33.77 kg/m.  Estimated Nutritional Needs:   Kcal:  1950-2150 kcals  Protein:  90-105 g  Fluid:  >/= 2L   Kerman Passey MS, RDN, LDN, CNSC Registered Dietitian 3 Clinical Nutrition RD Pager and On-Call Pager Number Located in [place]

## 2022-03-31 DIAGNOSIS — J690 Pneumonitis due to inhalation of food and vomit: Secondary | ICD-10-CM

## 2022-03-31 DIAGNOSIS — G839 Paralytic syndrome, unspecified: Secondary | ICD-10-CM | POA: Diagnosis not present

## 2022-03-31 DIAGNOSIS — Z9911 Dependence on respirator [ventilator] status: Secondary | ICD-10-CM | POA: Diagnosis not present

## 2022-03-31 LAB — BASIC METABOLIC PANEL
Anion gap: 10 (ref 5–15)
BUN: 10 mg/dL (ref 6–20)
CO2: 28 mmol/L (ref 22–32)
Calcium: 8.5 mg/dL — ABNORMAL LOW (ref 8.9–10.3)
Chloride: 95 mmol/L — ABNORMAL LOW (ref 98–111)
Creatinine, Ser: 0.76 mg/dL (ref 0.44–1.00)
GFR, Estimated: >60 mL/min (ref >60)
Glucose, Bld: 114 mg/dL — ABNORMAL HIGH (ref 70–99)
Potassium: 3.4 mmol/L — ABNORMAL LOW (ref 3.5–5.1)
Sodium: 133 mmol/L — ABNORMAL LOW (ref 135–145)

## 2022-03-31 LAB — GLUCOSE, CAPILLARY
Glucose-Capillary: 112 mg/dL — ABNORMAL HIGH (ref 70–99)
Glucose-Capillary: 110 mg/dL — ABNORMAL HIGH (ref 70–99)
Glucose-Capillary: 162 mg/dL — ABNORMAL HIGH (ref 70–99)
Glucose-Capillary: 123 mg/dL — ABNORMAL HIGH (ref 70–99)
Glucose-Capillary: 108 mg/dL — ABNORMAL HIGH (ref 70–99)
Glucose-Capillary: 159 mg/dL — ABNORMAL HIGH (ref 70–99)

## 2022-03-31 LAB — FACTOR 5 LEIDEN

## 2022-03-31 LAB — CULTURE, RESPIRATORY W GRAM STAIN: Culture: NORMAL

## 2022-03-31 MED ORDER — ONDANSETRON HCL 4 MG/2ML IJ SOLN: 4.0000 mg | Freq: Once | INTRAMUSCULAR | Status: AC

## 2022-03-31 MED ORDER — SODIUM CHLORIDE 0.9 % IV SOLN: 8.0000 mg | Freq: Once | INTRAVENOUS | Status: DC | PRN

## 2022-03-31 MED ORDER — POTASSIUM CHLORIDE 20 MEQ PO PACK
20.0000 meq | PACK | Freq: Once | ORAL | Status: AC
  Administered 2022-03-31: 20 meq
  Filled 2022-03-31: qty 1

## 2022-03-31 MED ORDER — POTASSIUM CHLORIDE 10 MEQ/50ML IV SOLN
10.0000 meq | INTRAVENOUS | Status: AC
  Administered 2022-03-31 (×4): 10 meq via INTRAVENOUS
  Filled 2022-03-31 (×4): qty 50

## 2022-03-31 MED ORDER — CLOPIDOGREL BISULFATE 75 MG PO TABS
75.0000 mg | ORAL_TABLET | Freq: Once | ORAL | Status: AC
  Administered 2022-03-31: 75 mg
  Filled 2022-03-31: qty 1

## 2022-03-31 MED ORDER — FREE WATER
100.0000 mL | Freq: Three times a day (TID) | Status: DC
  Administered 2022-03-31 – 2022-04-03 (×9): 100 mL

## 2022-03-31 MED ORDER — POTASSIUM CHLORIDE 20 MEQ PO PACK
40.0000 meq | PACK | Freq: Once | ORAL | Status: AC
  Administered 2022-03-31: 40 meq
  Filled 2022-03-31: qty 2

## 2022-03-31 MED ORDER — SODIUM CHLORIDE 0.9 % IV SOLN
6.2500 mg | Freq: Once | INTRAVENOUS | Status: DC
  Filled 2022-03-31: qty 0.25

## 2022-03-31 MED ORDER — JEVITY 1.2 CAL PO LIQD
1000.0000 mL | ORAL | Status: DC
  Administered 2022-03-31: 1000 mL
  Filled 2022-03-31: qty 1000

## 2022-03-31 MED ORDER — ONDANSETRON HCL 4 MG/2ML IJ SOLN
INTRAMUSCULAR | Status: AC
  Administered 2022-03-31: 4 mg via INTRAVENOUS
  Filled 2022-03-31: qty 2

## 2022-03-31 MED ORDER — CLONAZEPAM 1 MG PO TABS
1.0000 mg | ORAL_TABLET | Freq: Once | ORAL | Status: AC
  Administered 2022-03-31: 1 mg
  Filled 2022-03-31: qty 1

## 2022-03-31 MED ORDER — ONDANSETRON HCL 4 MG/2ML IJ SOLN
4.0000 mg | Freq: Four times a day (QID) | INTRAMUSCULAR | Status: DC | PRN
  Administered 2022-04-03: 4 mg via INTRAVENOUS
  Filled 2022-03-31: qty 2

## 2022-03-31 MED ORDER — FENTANYL CITRATE PF 50 MCG/ML IJ SOSY
25.0000 ug | PREFILLED_SYRINGE | INTRAMUSCULAR | Status: DC | PRN
  Administered 2022-04-05: 100 ug via INTRAVENOUS
  Administered 2022-04-06 (×2): 50 ug via INTRAVENOUS
  Administered 2022-04-07: 75 ug via INTRAVENOUS
  Filled 2022-03-31 (×2): qty 2
  Filled 2022-03-31 (×3): qty 1

## 2022-03-31 NOTE — Progress Notes (Signed)
Brief Nutrition Follow-up:  Pt with significant nausea post bolus feeding this AM. Per RN, between bolus feeding, free water and med administration pt received around 550-600 mL. This is a significant volume which was likely the culprit to pt's nausea.   Recommend switching back to continuous feedings once nausea resolves. Pt previously tolerating Jevity 1.2 at 70 ml/hr.   Consider trial of bolus feedings in the future, will need to ensure that meds and extra flushes are NOT administered at the same time as the bolus feeding. We could also consider trial of cyclic feedings (These could run during the day or at night) to allow break from feeding pump OR we could consider combination of bolus during the day and nocturnal feeds at night.   RD to continue to follow  Kerman Passey MS, RDN, LDN, CNSC Registered Dietitian 3 Clinical Nutrition RD Pager and On-Call Pager Number Located in [place]

## 2022-03-31 NOTE — Progress Notes (Signed)
Occupational Therapy Treatment Patient Details Name: Rachael [name] MRN: [id number] DOB: [birth year] Today's Date: 03/31/2022   History of present illness Rachael [name] is a 22 y.o. female admitted 03/19/22 with acute arms and leg weakness, dyspnea and hypercabia.  (+) for polysubstances. Pt was intubated on 2/16.   MRI confirms anteromedial high cervical spinal cord and lower medulla stroke consistent with an anterior spinal artery stroke  PMH significant for depression, HLD, Kidney abscess   OT comments  Lonn Georgia continues to not make notable progress towards her acute goals. Pt was resting in bed with HOB at 30 degrees upon arrival. Full body PROM exercises listed below with good tolerance. Pt was dependently repositioned in the bed and HOB elevated to 40 degree. Pt with immediate increase in nystagmus, report of dizziness and diaphoretic symptoms after 2 minutes. Pt placed back to 30 degrees and required ~5 minutes to recover. Of note, pt also with report of increase in difficulty in "focusing," per pt's visitor the pt has always had poor acuity, but does not have glasses acutely. Pt communicating appropriately by mouthing throughout. OT to continue to follow. D/c remains appropriate.    Recommendations for follow up therapy are one component of a multi-disciplinary discharge planning process, led by the attending physician.  Recommendations may be updated based on patient status, additional functional criteria and insurance authorization.    Follow Up Recommendations  OT at Long-term acute care hospital     Assistance Recommended at Discharge Frequent or constant Supervision/Assistance  Patient can return home with the following  Two people to help with walking and/or transfers;Two people to help with bathing/dressing/bathroom;Assistance with cooking/housework;Assistance with feeding;Direct supervision/assist for medications management;Direct supervision/assist for financial management;Assist  for transportation;Help with stairs or ramp for entrance   Equipment Recommendations  Other (comment) (defer)       Precautions / Restrictions Precautions Precautions: Other (comment) Precaution Comments: Trach, vent, cortrak, quadriplegia, flexiseal Restrictions Weight Bearing Restrictions: No       Mobility Bed Mobility Overal bed mobility: Needs Assistance Bed Mobility: Rolling           General bed mobility comments: totalAx2 for rolling for repositioning    Transfers                   General transfer comment: total assist will need lift, used chair position in bed, HOB increased to 55 deg, +nystagmus in bilat eyes with report of dizziness, able to tolerate at 45 deg         ADL either performed or assessed with clinical judgement   ADL Overall ADL's : Needs assistance/impaired           General ADL Comments: total A for all aspects of ADL at this time    Extremity/Trunk Assessment Upper Extremity Assessment Upper Extremity Assessment: RUE deficits/detail;LUE deficits/detail RUE Deficits / Details: flaccid, edematous, full PROM RUE Sensation: WNL RUE Coordination: decreased fine motor;decreased gross motor LUE Deficits / Details: flaccid, edematous, full PROM LUE Sensation: WNL LUE Coordination: decreased fine motor;decreased gross motor   Lower Extremity Assessment Lower Extremity Assessment: Defer to PT evaluation        Vision   Vision Assessment?: Vision impaired- to be further tested in functional context Additional Comments: pt reports she has poor acuity at baseline, does not have glasses or know her perscription. Upbeating nystagmus noted throughout. Pt saying she is having a difficult time "focusing." Holding eyes closed the majority of the time unless cued otherwise   Perception Perception Perception:  Not tested   Praxis Praxis Praxis: Not tested    Cognition Arousal/Alertness: Awake/alert Behavior During Therapy: Flat  affect Overall Cognitive Status: Difficult to assess               General Comments: responding appropriately to all questions with mouthing words, able to voice needs        Exercises General Exercises - Upper Extremity Shoulder Flexion: PROM, Both, Seated, 10 reps Shoulder ABduction: PROM, Both, Seated, 10 reps Elbow Flexion: PROM, Both, Seated, 10 reps Elbow Extension: PROM, Both, Seated, 10 reps Wrist Flexion: PROM, Both, Seated, 10 reps General Exercises - Lower Extremity Ankle Circles/Pumps: PROM, Both, 10 reps, Seated Hip ABduction/ADduction: PROM, Both, Seated, 10 reps Hip Flexion/Marching: PROM, Both, Seated, 10 reps Low Level/ICU Exercises Ankle Circles/Pumps: PROM, Right, Left, Both, 10 reps Hip ABduction/ADduction: PROM, Right, Left, Both, 10 reps Heel Slides: PROM, Right, Left, Both, 10 reps Other Exercises Other Exercises: Gentle cervical rotation to L&R       General Comments VSS, pt asking to be suctioned, RN notified. Pt only tolerated HOB to 40 degrees for  minutes prior to becoming diaphoretic, increased nystagmus and report of nausea.    Pertinent Vitals/ Pain       Pain Assessment Pain Assessment: Faces Faces Pain Scale: Hurts a little bit Pain Location: neck with cervical PROM Pain Descriptors / Indicators: Grimacing Pain Intervention(s): Limited activity within patient's tolerance, Monitored during session   Frequency  Min 2X/week        Progress Toward Goals  OT Goals(current goals can now be found in the care plan section)  Progress towards OT goals: Not progressing toward goals - comment  Acute Rehab OT Goals Patient Stated Goal: needed suction OT Goal Formulation: With patient Time For Goal Achievement: 04/03/22 Potential to Achieve Goals: Fair ADL Goals Additional ADL Goal #1: Pt will tolerate upright position for 5 min with max A +2 assist (or use of bed) in preparation for participation in ADL Additional ADL Goal #2: Pt will  direct self-care activities using eye gaze and task cards with 100% accuracy Additional ADL Goal #3: Pt will roll at bed level for peri care and bathing with total A +2  Plan Discharge plan remains appropriate       AM-PAC OT "6 Clicks" Daily Activity     Outcome Measure   Help from another person eating meals?: Total Help from another person taking care of personal grooming?: Total Help from another person toileting, which includes using toliet, bedpan, or urinal?: Total Help from another person bathing (including washing, rinsing, drying)?: Total Help from another person to put on and taking off regular upper body clothing?: Total Help from another person to put on and taking off regular lower body clothing?: Total 6 Click Score: 6    End of Session Equipment Utilized During Treatment: Other (comment)  OT Visit Diagnosis: Other abnormalities of gait and mobility (R26.89);Muscle weakness (generalized) (M62.81);Other symptoms and signs involving the nervous system (R29.898)   Activity Tolerance Treatment limited secondary to medical complications (Comment)   Patient Left in bed;with call bell/phone within reach;with family/visitor present   Nurse Communication Precautions        Time: 1527-1550 OT Time Calculation (min): 23 min  Charges: OT General Charges $OT Visit: 1 Visit OT Treatments $Therapeutic Exercise: 23-37 mins  Shade Flood, OTR/L [place] Office [phone] Secure Chat Communication Preferred   Elliot Cousin 03/31/2022, 5:10 PM

## 2022-03-31 NOTE — Progress Notes (Signed)
[facility] ADULT ICU REPLACEMENT PROTOCOL   The patient does apply for the [facility] Adult ICU Electrolyte Replacment Protocol based on the criteria listed below:   1.Exclusion criteria: TCTS, ECMO, Dialysis, and Myasthenia Gravis patients 2. Is GFR >/= 30 ml/min? Yes.    Patient's GFR today is >60 3. Is SCr </= 2? Yes.   Patient's SCr is 0.76 mg/dL 4. Did SCr increase >/= 0.5 in 24 hours? No. 5.Pt's weight >40kg  Yes.   6. Abnormal electrolyte(s): potassium 3.4  7. Electrolytes replaced per protocol 8.  Call MD STAT for K+ </= 2.5, Phos </= 1, or Mag </= 1 Physician:  protocol  Darlys Gales 03/31/2022 6:14 AM

## 2022-03-31 NOTE — Progress Notes (Signed)
Patient complained of nausea. 4 mg zofran given per Rachael Abbott, DO and orders to pull stomach contents from PEG tube d/t morning meds and recent bolus feed given. Morning meds included oxycodone and klonopin.  564m stomach contents pulled back and wasted with STerie Purser RN.

## 2022-03-31 NOTE — Progress Notes (Signed)
NAME:  Rachael [name], MRN:  [id number], DOB:  2000-02-11, LOS: 12 ADMISSION DATE:  03/19/2022, CONSULTATION DATE:  03/31/22 REFERRING MD:  drawbridge, CHIEF COMPLAINT:  shortness of breath and weakness   History of Present Illness:  Rachael [name] is a 22 y.o. F with history of depression and suicidal ideation who presented to [place] ED with sudden onset of shortness of breath and upper and lower extremity weakness.  History taken from Epic as patient is intubated and no family is at the bedside.  She initially had a headache and back pain prior to onset but denied abdominal pain or chest pain.  Vitals showed an initial temp of 107F, bloodwork with WBC 18k, UDS + for bezos and THC.   Her symptoms progressed to the point she wasn't speaking and ABG showed hypercarbic respiratory failure so pt was intubated.  She had some bradycardia and hypotension post-intubation, so was started on peripheral Levophed and given atropine during transport   Pertinent  Medical History   has a past medical history of Depression, Hyperlipidemia, and Kidney abscess.   Significant Hospital Events: Including procedures, antibiotic start and stop dates in addition to other pertinent events   2/16 presented to DB with all weakness and shortness of breath requiring intubation and transfer to [facility] 2/18 episodes of desaturations and hypercarbia unexplained 2/19 trach, cortrak, PICC 2/23 therapeutic bronch 2/24 Eikenella corrodens BAL. Not triggering vent  2/25 narrow abx from vanc zosyn to rocephin. Will reach out to OSH re transfer and consideration of diaphragmatic pacemaker - unfortunately no beds at [facility] and declined transfer at [facility] 2/26 fevers, abx broadened> vanc/ merrem, diuresing 2/27 chest CT, MRI brain, ongoing fevers   Interim History / Subjective:   CT chest and MRI brain yesterday Continues to spike fevers, Tmax 103.1 C/o of headache.  Also mouths she does not want to try PMV again  Objective    Blood pressure (!) 104/55, pulse 77, temperature (!) 100.8 F (38.2 C), resp. rate (!) 30, height '5\' 7"'$  (1.702 m), weight 97.9 kg, SpO2 92 %.    Vent Mode: PRVC FiO2 (%):  [70 %-90 %] 70 % Set Rate:  [26 bmp-30 bmp] 30 bmp Vt Set:  [490 mL] 490 mL PEEP:  [5 cmH20-10 cmH20] 5 cmH20 Plateau Pressure:  [21 cmH20-27 cmH20] 27 cmH20   Intake/Output Summary (Last 24 hours) at 03/31/2022 0715 Last data filed at 03/31/2022 0700 Gross per 24 hour  Intake 3189.11 ml  Output 7105 ml  Net -3915.89 ml   Filed Weights   03/29/22 0500 03/30/22 0500 03/31/22 0500  Weight: 97.9 kg 97.8 kg 97.9 kg   General:  Young adult female lying in bed in NAD HEENT: MM pink/moist, pupils 3/reactive, anicteric, midline cuffed trach Neuro: Awake, mouths words appropriately, flaccid in all extremities CV: rr, no murmur PULM:  MV supported, coarse bilaterally, few scattered rhonchi, occasional insp wheeze on left, minimal whitish secretions GI:  obese, +bs, peg, foley, FMS Extremities: warm/dry, generalized edema, LUE PICC  Skin: no rashes   UOP 6.6L/ 24hrs -3.9L/ 24hrs  Net -4.7L Stool 569m/ 24hrs   2/26 trach asp> GPC, GNR>  2/24 BCX2> ngtd 3 days  Labs reviewed> Na 133, K 3.4, sCr stable   Assessment & Plan:    Goals of care / DNR status  - clear goals of care -- willing to try aggressive care for a couple of months (3) but if no meaningful improvement in functional status this quality of life is not acceptable  for the patient and she would want transition to comfort care. Mom and medical team are supportive, and will continue to help facilitate best shot at recovery during this period of time.   Acute hypoxic and hypercarbic respiratory failure 2/2 NM weakness s/p trach  Eikenella corrodens HCAP> 2/23, likely ongoing aspiration  Mucous plugging  Dysphagia -- s/p PEG  - CT chest 2/27> entire left lung consolidation, RML - cont full MV support, LTVV - minimize peep/ wean O2 as able> still with  high O2 requirements likely due to shunting  - cont aggressive pulmonary hygiene> meta nebs, CPT, HTS, left lung up for postural drainage - consider cough assist device> not currently available - trach care - intermittent VBG/ CXR  - follow cultures - cont vanc/ merrem> likely will need a prolonged course - ongoing aspiration precautions - will discuss with attending about possible therapeutic bronch - patient did not like PMV trial 2/27> due to air coming from nose/ mouth, no desaturations, stated she would prefer not to talk again.  Continue SLP efforts> does not like PMV thus far.  - PCCM attempted calls to [facility] and [facility] 2/25 for consideration transfer for diaphragmatic pacemaker and ongoing care, unfortuantely no beds [facility] and declined at [facility].  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 Vertical nystagmus- potentially related to known anterior cervical spinal artery infarction - appreciate neuro involvement - MRI brain 2/27> evolving infarct of upper cervical cord, no new lesions - per PT note, when HOB > 45 degrees, develops bilat nystagmus, reports of dizziness and hot feeling - cont DAPT x 3 weeks, then ASA - cont aggressive PT/ OT/ SLP - ongoing supportive care  - bowel regimen - cont statin> eventual consideration of teratogenic risk, still has implanon in place - TOC is reaching out to [place] center  Volume overload Neurogenic bladder - hold lasix today, softer pressure, monitor volume status closely - cont foley - strict I/Os   Hyponatremia -  reduce FWF, 100 ml q6h> q8hr - BMET in am    Anemia - transfuse for HB <7 or hemodynamically significant bleeding - decrease frequency of labs   At risk for malnutrition - PEG/ TF per RD, cont bolus feedings to help with satiety  Hyperglycemia - SSI PRN - goal BG 140-180   Diarrhea - banatrol TF BID, bolus feedings, eventual removal of FMS    CC time: 30 min    Kennieth Rad, MSN, AG-ACNP-BC [facility] 03/31/2022, 7:16 AM  See Amion for pager If no response to pager, please call PCCM consult pager After 7:00 pm call Elink

## 2022-03-31 NOTE — TOC Progression Note (Signed)
Transition of Care [facility]) - Progression Note    Patient Details  Name: Rachael [name] MRN: [id number] Date of Birth: 08/01/2000  Transition of Care [facility]) CM/SW [place], [place] Phone Number: 03/31/2022, 12:16 PM  Clinical Narrative:     TOC following for medical stability. TOC will continue to follow and assist with patients dc planning needs.   Expected Discharge Plan: IP Rehab Facility Barriers to Discharge: Continued Medical Work up  Expected Discharge Plan and Services   Discharge Planning Services: CM Consult   Living arrangements for the past 2 months: Single Family Home                                       Social Determinants of Health (SDOH) Interventions SDOH Screenings   Food Insecurity: No Food Insecurity (03/23/2022)  Housing: Low Risk  (03/23/2022)  Transportation Needs: No Transportation Needs (03/23/2022)  Utilities: Not At Risk (03/23/2022)  Tobacco Use: Medium Risk (03/26/2022)    Readmission Risk Interventions     No data to display

## 2022-03-31 NOTE — TOC CM/SW Note (Signed)
03-31-2022  S/W  Rachael [name] @  [facility] contact for d/c planning needs. Phone # [phone]

## 2022-03-31 NOTE — Progress Notes (Signed)
Vomited this morning, PEG drained out-- 550cc drained, including most of her morning meds. Will give later doses of plavix x 1 and clonzapam x 1.  Julian Hy, DO 03/31/22 12:03 PM [facility]

## 2022-04-01 DIAGNOSIS — J9601 Acute respiratory failure with hypoxia: Secondary | ICD-10-CM | POA: Diagnosis not present

## 2022-04-01 DIAGNOSIS — Z515 Encounter for palliative care: Secondary | ICD-10-CM | POA: Diagnosis not present

## 2022-04-01 DIAGNOSIS — G839 Paralytic syndrome, unspecified: Secondary | ICD-10-CM | POA: Diagnosis not present

## 2022-04-01 DIAGNOSIS — Z930 Tracheostomy status: Secondary | ICD-10-CM | POA: Diagnosis not present

## 2022-04-01 DIAGNOSIS — Z66 Do not resuscitate: Secondary | ICD-10-CM | POA: Diagnosis not present

## 2022-04-01 DIAGNOSIS — Z9911 Dependence on respirator [ventilator] status: Secondary | ICD-10-CM | POA: Diagnosis not present

## 2022-04-01 DIAGNOSIS — R4182 Altered mental status, unspecified: Secondary | ICD-10-CM | POA: Diagnosis not present

## 2022-04-01 DIAGNOSIS — Z7189 Other specified counseling: Secondary | ICD-10-CM | POA: Diagnosis not present

## 2022-04-01 LAB — PROTHROMBIN GENE MUTATION

## 2022-04-01 LAB — CBC
HCT: 29.1 % — ABNORMAL LOW (ref 36.0–46.0)
Hemoglobin: 9.2 g/dL — ABNORMAL LOW (ref 12.0–15.0)
MCH: 27.8 pg (ref 26.0–34.0)
MCHC: 31.6 g/dL (ref 30.0–36.0)
MCV: 87.9 fL (ref 80.0–100.0)
Platelets: 281 10*3/uL (ref 150–400)
RBC: 3.31 MIL/uL — ABNORMAL LOW (ref 3.87–5.11)
RDW: 14.5 % (ref 11.5–15.5)
WBC: 22.3 10*3/uL — ABNORMAL HIGH (ref 4.0–10.5)
nRBC: 0 % (ref 0.0–0.2)

## 2022-04-01 LAB — HEPATIC FUNCTION PANEL
ALT: 147 U/L — ABNORMAL HIGH (ref 0–44)
AST: 77 U/L — ABNORMAL HIGH (ref 15–41)
Albumin: 2.4 g/dL — ABNORMAL LOW (ref 3.5–5.0)
Alkaline Phosphatase: 80 U/L (ref 38–126)
Bilirubin, Direct: <0.1 mg/dL (ref 0.0–0.2)
Total Bilirubin: 0.7 mg/dL (ref 0.3–1.2)
Total Protein: 6.3 g/dL — ABNORMAL LOW (ref 6.5–8.1)

## 2022-04-01 LAB — BASIC METABOLIC PANEL
Anion gap: 8 (ref 5–15)
BUN: 10 mg/dL (ref 6–20)
CO2: 28 mmol/L (ref 22–32)
Calcium: 8.6 mg/dL — ABNORMAL LOW (ref 8.9–10.3)
Chloride: 99 mmol/L (ref 98–111)
Creatinine, Ser: 0.78 mg/dL (ref 0.44–1.00)
GFR, Estimated: >60 mL/min (ref >60)
Glucose, Bld: 116 mg/dL — ABNORMAL HIGH (ref 70–99)
Potassium: 3.8 mmol/L (ref 3.5–5.1)
Sodium: 135 mmol/L (ref 135–145)

## 2022-04-01 LAB — CULTURE, BLOOD (ROUTINE X 2)
Culture: NO GROWTH
Culture: NO GROWTH

## 2022-04-01 LAB — GLUCOSE, CAPILLARY
Glucose-Capillary: 123 mg/dL — ABNORMAL HIGH (ref 70–99)
Glucose-Capillary: 129 mg/dL — ABNORMAL HIGH (ref 70–99)
Glucose-Capillary: 129 mg/dL — ABNORMAL HIGH (ref 70–99)
Glucose-Capillary: 135 mg/dL — ABNORMAL HIGH (ref 70–99)
Glucose-Capillary: 135 mg/dL — ABNORMAL HIGH (ref 70–99)
Glucose-Capillary: 95 mg/dL (ref 70–99)

## 2022-04-01 MED ORDER — FUROSEMIDE 10 MG/ML IJ SOLN
40.0000 mg | Freq: Three times a day (TID) | INTRAMUSCULAR | Status: AC
  Administered 2022-04-01 – 2022-04-02 (×2): 40 mg via INTRAVENOUS
  Filled 2022-04-01 (×2): qty 4

## 2022-04-01 MED ORDER — JEVITY 1.2 CAL PO LIQD
1000.0000 mL | ORAL | Status: DC
  Filled 2022-04-01 (×2): qty 1000

## 2022-04-01 MED ORDER — BANATROL TF EN LIQD
60.0000 mL | Freq: Four times a day (QID) | ENTERAL | Status: DC
  Administered 2022-04-01 – 2022-04-16 (×54): 60 mL
  Filled 2022-04-01 (×53): qty 60

## 2022-04-01 MED ORDER — JEVITY 1.2 CAL PO LIQD
1000.0000 mL | ORAL | Status: DC
  Administered 2022-04-01 – 2022-04-07 (×6): 1000 mL
  Filled 2022-04-01 (×9): qty 1000

## 2022-04-01 MED ORDER — ZOLPIDEM TARTRATE 5 MG PO TABS
5.0000 mg | ORAL_TABLET | Freq: Every evening | ORAL | Status: DC | PRN
  Administered 2022-04-01 – 2022-04-15 (×11): 5 mg
  Filled 2022-04-01 (×10): qty 1

## 2022-04-01 MED ORDER — PROSOURCE TF20 ENFIT COMPATIBL EN LIQD
60.0000 mL | Freq: Three times a day (TID) | ENTERAL | Status: DC
  Administered 2022-04-01 – 2022-04-16 (×43): 60 mL
  Filled 2022-04-01 (×43): qty 60

## 2022-04-01 MED ORDER — FUROSEMIDE 10 MG/ML IJ SOLN: 40.0000 mg | Freq: Three times a day (TID) | INTRAMUSCULAR | Status: DC

## 2022-04-01 MED ORDER — POTASSIUM CHLORIDE 20 MEQ PO PACK
40.0000 meq | PACK | Freq: Once | ORAL | Status: AC
  Administered 2022-04-01: 40 meq
  Filled 2022-04-01: qty 2

## 2022-04-01 NOTE — Progress Notes (Signed)
Pharmacy Antibiotic Note  Rachael [name] is a 22 y.o. 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 Intubated, sedated and started on Unasyn for pneumonia. ABX ultimately broadened with ongoing fevers.  Plan: Vancoycin 2gm x1 then 1gm IV q8h Meropenem 1gm IV q8h  Check VT tomorrow am   Height: '5\' 7"'$  (170.2 cm) Weight: 97.1 kg (214 lb 1.1 oz) IBW/kg (Calculated) : 61.6  Temp (24hrs), Avg:101.3 F (38.5 C), Min:100.2 F (37.9 C), Max:101.8 F (38.8 C)  Recent Labs  Lab 03/27/22 0442 03/28/22 0423 03/29/22 0532 03/30/22 0545 03/31/22 0500 04/01/22 0402  WBC 16.7* 16.0* 15.8* 19.4*  --  22.3*  CREATININE 0.77 0.81 0.73 0.75 0.76 0.78     Estimated Creatinine Clearance: 133.1 mL/min (by C-G formula based on SCr of 0.78 mg/dL).    No Known Allergies  Antimicrobials this admission: Unasyn 2/21>2/23 Vanc 2/23>2/25  restart 2/26 Zosyn 2/23>2/25 Ceftriax 2/25>2/26 Meropenem 2/26>  Dose adjustments this admission:   Microbiology results: 2/23 Bronch EIKENELLA CORRODENS  2/19 TA ngtd 2/16 BCx: ngtd 2/16 MRSA PCR: neg  Arrie Senate, PharmD, BCPS, [facility] Clinical Pharmacist [phone] Please check AMION for all [facility] numbers 04/01/2022

## 2022-04-01 NOTE — Progress Notes (Signed)
Speech Language Pathology Treatment: [name] Speaking valve  Patient Details Name: Deshai Cruver MRN: [id number] DOB: [birth year] Today's Date: 04/01/2022 Time: 1340-1410 SLP Time Calculation (min) (ACUTE ONLY): 30 min  Assessment / Plan / Recommendation Clinical Impression  Agape was having significant anxiety around trying the PMV inline.  We spent the session instead focusing on adjusting to the expiratory flow through oral/nasal cavities and postponing PMV placement. Pt was shown a trach tube; cuff and its purpose were explained.  RT present to partially deflate cuff and reduce PEEP from 8 to 5.  Initially pt was uncomfortable; encouraged to refrain from talking and focus on oral exhalation through pursed lips.  VS remained stable.  Pt verbalized improved comfort as session progressed. She was able to coordinate phonation with the vent and vocalize three-four words during exhalation.   Agreed to continue trials of cuff deflation and attempt PMV again at later date.  RT restored PEEP and reinflated cuff. Continue SLP for communication.    HPI HPI: Yoshi Akita is a 22 y.o. female with PMH significant for depression, HLD, Kidney abscess who presents with acute arms and leg weakness, dyspnea and hypercabia.  Pt was intubated on 2/16.   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      SLP Plan  Continue with current plan of care      Recommendations for follow up therapy are one component of a multi-disciplinary discharge planning process, led by the attending physician.  Recommendations may be updated based on patient status, additional functional criteria and insurance authorization.    Recommendations         Patient may use Passy-Muir Speech Valve:  (hold on PMV for now)         Oral Care Recommendations: Oral care QID Plan: Continue with current plan of care          Myiah Petkus L.  Tivis Ringer, MA CCC/SLP Clinical Specialist - Acute Care SLP Acute Rehabilitation Services Office number [phone]  [name]  04/01/2022, 2:25 PM

## 2022-04-01 NOTE — Progress Notes (Signed)
NAME:  Rachael [name], MRN:  [id number], DOB:  2001-01-18, LOS: 34 ADMISSION DATE:  03/19/2022, CONSULTATION DATE:  04/01/22 REFERRING MD:  drawbridge, CHIEF COMPLAINT:  shortness of breath and weakness   History of Present Illness:  Rachael [name] is a 22 y.o. F with history of depression and suicidal ideation who presented to [place] ED with sudden onset of shortness of breath and upper and lower extremity weakness.  History taken from Epic as patient is intubated and no family is at the bedside.  She initially had a headache and back pain prior to onset but denied abdominal pain or chest pain.  Vitals showed an initial temp of 61F, bloodwork with WBC 18k, UDS + for bezos and THC.   Her symptoms progressed to the point she wasn't speaking and ABG showed hypercarbic respiratory failure so pt was intubated.  She had some bradycardia and hypotension post-intubation, so was started on peripheral Levophed and given atropine during transport   Pertinent  Medical History   has a past medical history of Depression, Hyperlipidemia, and Kidney abscess.   Significant Hospital Events: Including procedures, antibiotic start and stop dates in addition to other pertinent events   2/16 presented to DB with all weakness and shortness of breath requiring intubation and transfer to [facility] 2/18 episodes of desaturations and hypercarbia unexplained 2/19 trach, cortrak, PICC 2/23 therapeutic bronch 2/24 Eikenella corrodens BAL. Not triggering vent  2/25 narrow abx from vanc zosyn to rocephin. Will reach out to OSH re transfer and consideration of diaphragmatic pacemaker - unfortunately no beds at [facility] and declined transfer at [facility] 2/26 fevers, abx broadened> vanc/ merrem, diuresing 2/27 chest CT, MRI brain, ongoing fevers, did not tolerate/ like PMV 2/28 trach aspirate resent as 2/26 neg.  Continued fevers.  Episode of vomiting after large bolus feeding.  Remains on 70%  Interim History / Subjective:  No  events overnight Mother at bedside Tmax 101.7 Patient denies any pain, asking for more water for dry mouth.  Mother and patient state they were told it was ok for her to have water.  Water cup and straw at bedside.  We discussed why this not advised given her ongoing aspiration, fevers, and severe left PNA, without any swallowing evaluation/ SLP, and poor muscle control from NM causing dysphagia.  Patient was adamant to not talk anymore about it, asked to turn the lights off and let her rest.    Objective   Blood pressure (!) 108/57, pulse 77, temperature (!) 101.5 F (38.6 C), resp. rate 20, height '5\' 7"'$  (1.702 m), weight 97.1 kg, SpO2 96 %.    Vent Mode: PRVC FiO2 (%):  [40 %-90 %] 70 % Set Rate:  [20 bmp] 20 bmp Vt Set:  [490 mL] 490 mL PEEP:  [8 cmH20] 8 cmH20 Plateau Pressure:  [19 cmH20-31 cmH20] 31 cmH20   Intake/Output Summary (Last 24 hours) at 04/01/2022 0727 Last data filed at 04/01/2022 0700 Gross per 24 hour  Intake 2173.04 ml  Output 6240 ml  Net -4066.96 ml   Filed Weights   03/30/22 0500 03/31/22 0500 04/01/22 0353  Weight: 97.8 kg 97.9 kg 97.1 kg   General:  ill appearing young adult female lying in bed in NAD HEENT: MM pale/moist, midline cuffed trach- site wnl Neuro: awake, mouthing words, quad CV: rr, NSR, no murmur PULM:  full MV support, coarse, scattered rhonchi L> R, and wheeze, mild to moderate thin white secretions GI: soft, bs+, PEG, foley, FMS Extremities: warm/dry, generalized, +1LE edema,  PICC LUE wnl Skin: no rashes  UOP 5.2L/ 24hrs -4L/ 24hrs  Net -8.7L Stool 500> 440 ml/ 24hrs   2/28 trach asp>  2/26 trach asp> normal flora 2/24 BCX2> ngtd   Labs reviewed> Na 135, K 3.8, sCr 0.78, WBC 19.4> 22.3, H/H stable  Assessment & Plan:    Goals of care / DNR status  - clear goals of care -- willing to try aggressive care for a couple of months (3) but if no meaningful improvement in functional status this quality of life is not acceptable for  the patient and she would want transition to comfort care. Mom and medical team are supportive, and will continue to help facilitate best shot at recovery during this period of time.  PMT following, appreciate assistance   Acute hypoxic and hypercarbic respiratory failure 2/2 NM weakness s/p trach  Eikenella corrodens HCAP> 2/23, likely ongoing aspiration  Mucous plugging  Dysphagia -- s/p PEG  - cont full MV support, LTVV - Cont to wean O2 as able> still with high O2 requirements likely due to shunting/ ongoing aspiration  - could consider scopolamine patch to help with oral secretions, however patient already c/o of dry mouth and likely not to tolerate further dry mouth, could exacerbate mucous plugging as well - cont aggressive pulmonary hygiene> meta nebs, CPT, HTS, left lung up for postural drainage - consider cough assist device> not currently available - trach care - CXR in am  - follow repeat trach asp sent 2/28 given ongoing fevers, worsening imaging of lungs, and purulent secretions - cont vanc/ merrem> likely will need a prolonged course - ongoing aspiration precautions- NPO for now.  Patient did not want to discuss this further with myself.  Dr. Carlis Abbott to discuss risks/ benefits of continued PO intake despite concern for recurrent aspiration and ongoing pneumonia that will not resolve.  Plan to get FEES with SLP 3/1 to further evaluate swallowing.   - cont SLP efforts with PMV attempts   - PCCM attempted calls to [facility] and [facility] 2/25 for consideration transfer for diaphragmatic pacemaker and ongoing care, unfortuantely no beds [facility] and declined at [facility].    Fevers, leukocytosis  - cont fevers, WBC up despite abx, neg trach asp 2/26.  Trach asp resent 2/28, follow.  Suspect ongoing aspiration/ dense left pneumonia but consider other sources.  UA neg 2/16, dopplers neg 2/18.  No risk to add fungal coverage.   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  Autodiuresed, with 5.2L UOP.  Monitor volume status closely - cont foley - strict I/Os  Hyponatremia - cont FWF, 100 ml q6h> q8hr - trend BMET   Anemia - transfuse for HB <7 or hemodynamically significant  bleeding - decrease frequency of labs   At risk for malnutrition N/V 2/28 - vomiting episode likely attributed to too large of bolus feed given with free water (550-600 ml).  Tolerating cont feedings for now, considering implementing smaller cyclic feedings +/- bolus and nocturnal feedings.  Appreciate RD assistance and recommendations.  - PEG  Hyperglycemia - SSI PRN - goal BG 140-180   Diarrhea - banatrol TF BID, bolus feedings, eventual removal of FMS    CC time: 32 min    Kennieth Rad, MSN, AG-ACNP-BC [facility] 04/01/2022, 7:27 AM  See Amion for pager If no response to pager, please call PCCM consult pager After 7:00 pm call Elink

## 2022-04-01 NOTE — Progress Notes (Signed)
Occupational Therapy Treatment Patient Details Name: Rachael [name] MRN: [id number] DOB: 2000-07-14 Today's Date: 04/01/2022   History of present illness Rachael [name] is a 22 y.o. female admitted 03/19/22 with acute arms and leg weakness, dyspnea and hypercabia.  (+) for polysubstances. Pt was intubated on 2/16.   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 Mom verbalizing "It's been a rough day" OT provided emotional support. Pt with good communication through mouthing - making wants and needs very clearly known. PROM for BUE and BLE performed at bed level (see exercises below). Nystagmus (vertical) present whenever Pt opened her eyes, and Pt declining chair position today. Total A +2 for repositioning to get Pt more comfortable and special care taken using pillows to pad and support patient for efficient edema management (in conjunction with PROM). Pt tearful during session and very clearly stating "I do not want to do this, I do not want to live like this" both patient and mother York Cerise) tearful. Palliative and RN made aware of comments. Also prepped Pt/mother for upcoming session with SLP and provided emotional support in general. POC remains appropriate at this time. OT will continue to follow acutely.    Recommendations for follow up therapy are one component of a multi-disciplinary discharge planning process, led by the attending physician.  Recommendations may be updated based on patient status, additional functional criteria and insurance authorization.    Follow Up Recommendations  OT at Long-term acute care hospital     Assistance Recommended at Discharge Frequent or constant Supervision/Assistance  Patient can return home with the following  Two people to help with walking and/or  transfers;Two people to help with bathing/dressing/bathroom;Assistance with cooking/housework;Assistance with feeding;Direct supervision/assist for medications management;Direct supervision/assist for financial management;Assist for transportation;Help with stairs or ramp for entrance   Equipment Recommendations  Other (comment) (defer)    Recommendations for Other Services PT consult;Speech consult    Precautions / Restrictions Restrictions Weight Bearing Restrictions: No       Mobility Bed Mobility Overal bed mobility: Needs Assistance Bed Mobility: Rolling           General bed mobility comments: totalAx2 for rolling for repositioning    Transfers                   General transfer comment: NT this session     Balance       Sitting balance - Comments: nystagmus present with HOB at 30 degrees today. Pt declined further elevation of head or chair position                                   ADL either performed or assessed with clinical judgement   ADL Overall ADL's : Needs assistance/impaired                                       General ADL Comments: total A for all aspects of ADL at this time    Extremity/Trunk Assessment Upper Extremity Assessment Upper Extremity Assessment: RUE deficits/detail;LUE deficits/detail RUE Deficits / Details: flaccid, edematous, full PROM RUE Sensation: WNL RUE Coordination: decreased fine motor;decreased gross motor LUE Deficits / Details: flaccid, edematous, full  PROM LUE Sensation: WNL LUE Coordination: decreased fine motor;decreased gross motor   Lower Extremity Assessment Lower Extremity Assessment: Defer to PT evaluation        Vision   Vision Assessment?: Vision impaired- to be further tested in functional context Additional Comments: nystagmus present throughout session. Pt declined further evaluation today   Perception     Praxis      Cognition Arousal/Alertness:  Awake/alert Behavior During Therapy: WFL for tasks assessed/performed, Flat affect Overall Cognitive Status: Within Functional Limits for tasks assessed (communicated through mouthing, but very appropriate)                                 General Comments: responding appropriately to all questions with mouthing words, able to voice needs        Exercises Exercises: Low Level/ICU General Exercises - Upper Extremity Shoulder Flexion: PROM, Both, 10 reps, Supine (HOB at 30) Shoulder ABduction: PROM, Both, 10 reps, Supine (HOB at 30) Elbow Flexion: PROM, Both, 10 reps, Supine (HOB at 30) Elbow Extension: PROM, Both, 10 reps, Supine (HOB at 30) Wrist Flexion: PROM, Both, 10 reps, Supine (HOB at 30) Wrist Extension: PROM, Both, 10 reps, Supine (HOB at 30) General Exercises - Lower Extremity Ankle Circles/Pumps: PROM, Both, 10 reps, Supine (HOB at 30) Short Arc Quad: PROM, Both, 10 reps, Supine (HOB at 30) Hip ABduction/ADduction: PROM, Both, 10 reps, Supine (HOB at 30) Hip Flexion/Marching: PROM, Both, 10 reps, Supine (HOB at 30)    Shoulder Instructions       General Comments      Pertinent Vitals/ Pain       Pain Assessment Pain Assessment: Faces Faces Pain Scale: Hurts even more Pain Location: lower back of head Pain Descriptors / Indicators: Grimacing, Constant Pain Intervention(s): Monitored during session, Repositioned, Ice applied  Home Living                                          Prior Functioning/Environment              Frequency  Min 2X/week        Progress Toward Goals  OT Goals(current goals can now be found in the care plan section)  Progress towards OT goals: Not progressing toward goals - comment (pain limiting and emotionally overwhelmed today)  Acute Rehab OT Goals Patient Stated Goal: communicate needs and wants OT Goal Formulation: With patient/family Time For Goal Achievement: 04/03/22 Potential to  Achieve Goals: [place] Discharge plan remains appropriate    Co-evaluation    PT/OT/SLP Co-Evaluation/Treatment: Yes Reason for Co-Treatment: Complexity of the patient's impairments (multi-system involvement);Necessary to address cognition/behavior during functional activity;For patient/therapist safety PT goals addressed during session: Strengthening/ROM OT goals addressed during session: ADL's and self-care;Strengthening/ROM      AM-PAC OT "6 Clicks" Daily Activity     Outcome Measure   Help from another person eating meals?: Total Help from another person taking care of personal grooming?: Total Help from another person toileting, which includes using toliet, bedpan, or urinal?: Total Help from another person bathing (including washing, rinsing, drying)?: Total Help from another person to put on and taking off regular upper body clothing?: Total Help from another person to put on and taking off regular lower body clothing?: Total 6 Click Score: 6    End of Session  OT Visit Diagnosis: Other abnormalities of gait and mobility (R26.89);Muscle weakness (generalized) (M62.81);Other symptoms and signs involving the nervous system (R29.898)   Activity Tolerance Patient tolerated treatment well   Patient Left in bed;with call bell/phone within reach;with family/visitor present   Nurse Communication Precautions;Other (comment) (Pt mouthing "I don't want to do this, I don't want to live like this")        Time: 1031-1056 OT Time Calculation (min): 25 min  Charges: OT General Charges $OT Visit: 1 Visit OT Treatments $Therapeutic Exercise: 8-22 mins  Jesse Sans OTR/L Acute Rehabilitation Services Office: [place] 04/01/2022, 11:46 AM

## 2022-04-01 NOTE — Progress Notes (Signed)
Physical Therapy Treatment Patient Details Name: Rachael [name] MRN: [id number] DOB: May 02, 2000 Today's Date: 04/01/2022   History of Present Illness Rachael [name] is a 22 y.o. female admitted 03/19/22 with acute arms and leg weakness, dyspnea and hypercabia.  (+) for polysubstances. Pt was intubated on 2/16.   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 The pt was agreeable to attempts at repositioning to improve pain and PROM of all extremities. The pt remained tearful through session, with both patient and her mother expressing frustrations with current medical situation. Pt declined chair position this session, remains totalA of 2 to safely and comfortably reposition the pt in bed. PT will continue to follow acutely and address ROM and safe OOB positioning as it aligns with pt goals of care.    Recommendations for follow up therapy are one component of a multi-disciplinary discharge planning process, led by the attending physician.  Recommendations may be updated based on patient status, additional functional criteria and insurance authorization.  Follow Up Recommendations  Skilled nursing-short term rehab (<3 hours/day) Can patient physically be transported by private vehicle: No   Assistance Recommended at Discharge Frequent or constant Supervision/Assistance  Patient can return home with the following Two people to help with walking and/or transfers;Two people to help with bathing/dressing/bathroom;Assistance with cooking/housework;Assistance with feeding;Direct supervision/assist for medications management;Direct supervision/assist for financial management;Assist for transportation;Help with  stairs or ramp for entrance   Equipment Recommendations  Hospital bed;Wheelchair (measurements PT);Wheelchair cushion (measurements PT);Other (comment) (tilt in space power chair, hoyer)    Recommendations for Other Services       Precautions / Restrictions Precautions Precautions: Other (comment) Precaution Comments: Trach, vent, cortrak, quadriplegia, flexiseal Restrictions Weight Bearing Restrictions: No     Mobility  Bed Mobility Overal bed mobility: Needs Assistance Bed Mobility: Rolling           General bed mobility comments: totalAx2 for rolling for repositioning    Transfers                   General transfer comment: NT this session     Modified Rankin (Stroke Patients Only) Modified Rankin (Stroke Patients Only) Pre-Morbid Rankin Score: No symptoms Modified Rankin: Severe disability        Cognition Arousal/Alertness: Awake/alert Behavior During Therapy: WFL for tasks assessed/performed, Flat affect Overall Cognitive Status: Within Functional Limits for tasks assessed (communicated through mouthing, but very appropriate)                                 General Comments: responding appropriately to all questions with mouthing words, able to voice needs        Exercises General Exercises - Upper Extremity Shoulder Flexion: PROM, Both, 10 reps, Supine (HOB at 30) Shoulder ABduction: PROM, Both, 10 reps, Supine (HOB at 30) Elbow Flexion: PROM, Both, 10 reps, Supine (HOB at 30) Elbow Extension: PROM, Both, 10 reps, Supine (HOB at 30) Wrist Flexion: PROM, Both, 10 reps, Supine (HOB at 30) Wrist Extension: PROM, Both, 10 reps, Supine (HOB at 30) General Exercises - Lower Extremity Ankle Circles/Pumps: PROM, Both, 10 reps, Supine (HOB at 30) Short Arc Quad: PROM, Both, 10  reps, Supine (HOB at 30) Hip ABduction/ADduction: PROM, Both, 10 reps, Supine (HOB at 30) Hip Flexion/Marching: PROM, Both, 10 reps, Supine (HOB at 30) Low  Level/ICU Exercises Ankle Circles/Pumps: PROM, Both, 10 reps, Supine Hip ABduction/ADduction: PROM, Both, 10 reps, Supine Heel Slides: PROM, Both, 10 reps, Supine    General Comments General comments (skin integrity, edema, etc.): VSS, pt with edema in extremities, ice packs applied at arms and hips bilaterally      Pertinent Vitals/Pain Pain Assessment Pain Assessment: Faces Faces Pain Scale: Hurts even more Pain Location: lower back of head Pain Descriptors / Indicators: Grimacing, Constant Pain Intervention(s): Limited activity within patient's tolerance, Monitored during session, Repositioned     PT Goals (current goals can now be found in the care plan section) Acute Rehab PT Goals Patient Stated Goal: to reduce pain PT Goal Formulation: With patient/family Time For Goal Achievement: 04/15/22 Potential to Achieve Goals: Poor Progress towards PT goals: Not progressing toward goals - comment (pt having a bad day, llimited to PROM this session)    Frequency    Min 2X/week      PT Plan Current plan remains appropriate    Co-evaluation PT/OT/SLP Co-Evaluation/Treatment: Yes Reason for Co-Treatment: Complexity of the patient's impairments (multi-system involvement);Necessary to address cognition/behavior during functional activity;For patient/therapist safety PT goals addressed during session: Strengthening/ROM OT goals addressed during session: ADL's and self-care;Strengthening/ROM      AM-PAC PT "6 Clicks" Mobility   Outcome Measure  Help needed turning from your back to your side while in a flat bed without using bedrails?: Total Help needed moving from lying on your back to sitting on the side of a flat bed without using bedrails?: Total Help needed moving to and from a bed to a chair (including a wheelchair)?: Total Help needed standing up from a chair using your arms (e.g., wheelchair or bedside chair)?: Total Help needed to walk in hospital room?: Total Help  needed climbing 3-5 steps with a railing? : Total 6 Click Score: 6    End of Session   Activity Tolerance: Patient limited by fatigue;Patient limited by pain Patient left: in bed;with call bell/phone within reach;with nursing/sitter in room;with family/visitor present Nurse Communication: Mobility status;Need for lift equipment PT Visit Diagnosis: Muscle weakness (generalized) (M62.81);Other symptoms and signs involving the nervous system (R29.898)     Time: [id number] PT Time Calculation (min) (ACUTE ONLY): 23 min  Charges:  $Therapeutic Exercise: 8-22 mins                     [place] Carbo, PT, DPT   Acute Rehabilitation Department Office [phone] Secure Chat Communication Preferred   Sandra Cockayne 04/01/2022, 1:44 PM

## 2022-04-01 NOTE — Progress Notes (Signed)
Daily Progress Note   Patient Name: Rachael [name]       Date: 04/01/2022 DOB: 2000-12-31  Age: 22 y.o. MRN#: [id number] Attending Physician: Julian Hy, DO Primary Care Physician: Patient, No Pcp Per Admit Date: 03/19/2022 Length of Stay: 13 days  Reason for Consultation/Follow-up: Establishing goals of care  HPI/Patient Profile:   22 y.o. female  with past medical history of depression and suicidal ideation who presented to [facility] ED with sudden onset of shortness of breath and upper and lower extremity weakness. ED work-up found loss of speaking ability and hypercarbic respiratory failure and was subsequently intubated and transferred to [facility].  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    PMT was consulted for [place] conversations.  Subjective:   Subjective: Chart Reviewed. Updates received. Patient Assessed. Created space and opportunity for patient  and family to explore thoughts and feelings regarding current medical situation.  Today's Discussion: Today I saw the patient at the bedside, OT and PT were about to enter and work with the patient. The patient and mother expressed some frustrations with some of the staff, primarily around being too rough with the patient during personal care and turning. Offered for AD to come speak with mom, which she accepted. Given that OT and PT were wanting to work with the patient, I excused myself from the room and informed them I would plan to come back Monday, but PMT can be contacted before then if needed.  I spoke with Unit AD about concerns.   Later in the day I received a message from the the therapist about patient having a bad day and expressing I dont want to live this way anymore." When I was able to make it back to the unit,  the nurse stated after working with SLP and having a good experience with that she was ok with continuing care. Patient was resting so I elected to not wake her again. I later spoke with SLP who stated the patient did pretty well with taking the trach cuff down, without Passy-Muir valve and did well with this for about 10-15 minutes. She will continue to work with her and stated RT can take her trach cuff pressure down as needed to communicate needs/wishes.  I provided emotional and general support through therapeutic listening, empathy, sharing of stories, and other techniques. I answered all questions and addressed all concerns to the best of my ability.  Review of Systems  Constitutional:        Describes pain in the back of her head  Respiratory:  Negative for shortness of breath.   Gastrointestinal:  Negative for nausea and vomiting.    Objective:   Vital Signs:  BP (!) 106/59   Pulse 100   Temp (!) 101.5 F (38.6 C)   Resp 20   Ht '5\' 7"'$  (1.702 m)   Wt 97.1 kg   SpO2 100%   BMI 33.53 kg/m   Physical Exam: Physical Exam Vitals and nursing note reviewed.  Constitutional:      General: She is sleeping. She is not in acute distress. Cardiovascular:     Rate and Rhythm: Normal rate.  Pulmonary:     Effort: Pulmonary effort is normal. No respiratory distress.  Abdominal:     Palpations: Abdomen is soft.  Skin:    General: Skin is warm  and dry.  Neurological:     Mental Status: She is easily aroused.     Comments: Functional quadriplegia; mentation intact  Psychiatric:        Mood and Affect: Mood normal.        Behavior: Behavior normal.     Palliative Assessment/Data: 10-20% (PEG in place)    Existing Vynca/ACP Documentation: None  Assessment & Plan:   Impression: Present on Admission:  Altered mental state  SUMMARY OF RECOMMENDATIONS   DNR Continue full scope of care otherwise Goal of attempting to get some form of improvement Continued time for  outcomes PMT will continue to follow 1-2 times a week for ongoing support Please contact PMT for any significant changes or palliative needs over the weekend  Symptom Management:  Per primary team PMT will continue to follow  Code Status: DNR  Prognosis: Unable to determine  Discharge Planning: To Be Determined  Discussed with: Patient, family, OT, PT, SLP, nursing team  Thank you for allowing [place] to participate in the care of Mikhayla Olaes PMT will continue to support holistically.  Billing based on MDM: High  Problems Addressed: One acute or chronic illness or injury that poses a threat to life or bodily function  Amount and/or Complexity of Data: Category 3:Discussion of management or test interpretation with external physician/other qualified health care professional/appropriate source (not separately reported)  Risks: Decision not to resuscitate or to de-escalate care because of poor prognosis   Walden Field, NP Palliative Medicine Team  Team Phone # [phone] (Nights/Weekends)  09/30/2020, 8:17 AM

## 2022-04-01 NOTE — Evaluation (Signed)
Clinical/Bedside Swallow Evaluation Patient Details  Name: Rachael [name] MRN: [id number] Date of Birth: 2000-02-10  Today's Date: 04/01/2022 Time: SLP Start Time (ACUTE ONLY): 1340 SLP Stop Time (ACUTE ONLY): 1410 SLP Time Calculation (min) (ACUTE ONLY): 30 min  Past Medical History:  Past Medical History:  Diagnosis Date   Depression    few yrs ago (Rachael [name]), doing good now   Hyperlipidemia    Kidney abscess    Past Surgical History:  Past Surgical History:  Procedure Laterality Date   IR GASTROSTOMY TUBE MOD SED  03/26/2022   NO PAST SURGERIES     HPI:  Rachael [name] is a 22 y.o. female with PMH significant for depression, HLD, Kidney abscess who presents with acute arms and leg weakness, dyspnea and hypercarbia.  Pt was intubated on 2/16; trach 2/9; PEG 2/23. 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    Assessment / Plan / Recommendation  Clinical Impression  Pt was seen for clinical swallow assessment in conjunction with voice/speech treatment.  Dr. Carlis Abbott approved small amounts of ice chips for pleasure. While cuff was inflated, Rachael [name] accepted limited ice chips with active mastication, palpable swallow.  After partial cuff deflation, she had minimal subsequent ice chips with no change in performance.  Achieved low volume, intermittent voicing due to cuff leak.  She expresses a desire to eat. We discussed our inability to assess the physiology of her swallowing without an instrumental evaluation. The process of FEES was explained to pt and her mother; Rachael [name] would like to proceed with the exam next date. Allow minimal amounts of ice chips in the interim. SLP will follow. SLP Visit Diagnosis: Dysphagia, unspecified (R13.10)           Diet Recommendation   NPO except limited ice chips  Medication Administration: Via alternative means    Other  Recommendations Oral Care Recommendations: Oral  care QID    Recommendations for follow up therapy are one component of a multi-disciplinary discharge planning process, led by the attending physician.  Recommendations may be updated based on patient status, additional functional criteria and insurance authorization.    Swallow Study   General Date of Onset: 03/19/22 HPI: Rachael [name] is a 22 y.o. female with PMH significant for depression, HLD, Kidney abscess who presents with acute arms and leg weakness, dyspnea and hypercarbia.  Pt was intubated on 2/16; trach 2/9; PEG 2/23. 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 Type of Study: Bedside Swallow Evaluation Diet Prior to this Study: NPO Temperature Spikes Noted: No Respiratory Status: Trach;Ventilator Trach Size and Type: #6;Cuff;With PMSV not in place History of Recent Intubation: Yes Total duration of intubation (days): 3 days (trach three days after oral intubation) Behavior/Cognition: Alert;Cooperative Oral Cavity Assessment: Within Functional Limits Oral Care Completed by SLP: Recent completion by staff Oral Cavity - Dentition: Adequate natural dentition Vision: Functional for self-feeding Self-Feeding Abilities: Total assist Patient Positioning: Partially reclined Baseline Vocal Quality: Low vocal intensity Volitional Cough: Cognitively unable to elicit Volitional Swallow: Able to elicit    Oral/Motor/Sensory Function     Ice Chips Ice chips: Within functional limits   Thin Liquid Thin Liquid: Not tested    Nectar Thick Nectar Thick Liquid: Not tested   Honey Thick Honey Thick Liquid: Not tested   Puree Puree: Not tested   Solid     Solid: Not tested  Rachael [name] 04/01/2022,2:46 PM  Rachael Bamberg L. Tivis Ringer, MA CCC/SLP Clinical Specialist - [place] Office number [phone]

## 2022-04-01 NOTE — Progress Notes (Signed)
RT at beside to assist SLP with deflating cuff for PMV trail on vent. Trach cuff was slowly deflated and peep was decreased from 8 to 5. Pt was able to speak and expressed she was comfortable. Vitals stable, SpO2 remained 100%, RN at beside. RT re-inflated cuff and placed peep back to 8 at 1410. Pt tolerated well, MD aware, RT will monitor.

## 2022-04-01 NOTE — Progress Notes (Signed)
Nutrition Follow-up  DOCUMENTATION CODES:   Not applicable  INTERVENTION:   Tube Feeding via PEG:  Jevity 1.2 at 50 ml/hr Pro-source TF20 60 mL QID This provides 1680 kcals, 147 g of protein and 972 mL of free water   NUTRITION DIAGNOSIS:   Inadequate oral intake related to inability to eat as evidenced by NPO status.  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   PMH includes chronic prescription opioid and benzo use, depression with SI, HLD, kidney abscess. Pt is daily smoker; EtOH abuse, daily drinker-at least half pint  2/16 Admitted 2/19 Trach placed, Cortrak placed (distal stomach vs proximal duodenum) 2/23 Bronch: L mainstem mucous plugging, thick secretions, IR placed PEG tube 2/24 TF resumed via PEG, changed formula to Jevity 2/27 MRI brain-evolving infarct of upper cervical cord  Tolerating Jevity 1.2 at trickle rate of 20 ml/hr via PEG tube without N/V. Recommend re-advancing TF to goal rate. Likely the large bolus of TF, water and meds is the culprit of patient's nausea. Recommend trial of bolus and/or intermittent feedings Free water flush of 100 mL q 8 hours  Noted SLP and RT worked with pt re: PMV today. Excellent work by [employer] with slow progression with PMV and pt able to express some words and tolerated the PMV somewhat better.   Pt allowed small amounts of ice chips for pleasure. Noted possible FEES on follow-up  Labs: reviewed Meds: banatrol, B12, MVI, thiamine, folic  acid   Diet Order:   Diet Order             Diet NPO time specified  Diet effective now                   EDUCATION NEEDS:   Not appropriate for education at this time  Skin:  Skin Assessment: Skin Integrity Issues: Skin Integrity Issues:: Incisions, Other (Comment) Incisions: new PEG tube Other: MASD to buttocks  Last BM:  2/27 type 7 rectal tuber  Height:   Ht Readings from Last 1 Encounters:  03/22/22 '5\' 7"'$  (1.702 m)    Weight:   Wt Readings from Last 1 Encounters:  04/01/22 97.1 kg    Ideal Body Weight:  61.4 kg  BMI:  Body mass index is 33.53 kg/m.  Estimated Nutritional Needs:   Kcal:  1700-1900 kcals  Protein:  120-150 g  Fluid:  >/= 2L   Kerman Passey MS, RDN, LDN, CNSC Registered Dietitian 3 Clinical Nutrition RD Pager and On-Call Pager Number Located in [place]

## 2022-04-01 NOTE — Progress Notes (Signed)
PTs sutures removed by RT and CCM MD. Pt tolerated well, vitals stable,RN aware, trach secured by trach ties,RT will monitor.

## 2022-04-01 NOTE — TOC Initial Note (Addendum)
Transition of Care [facility]) - Initial/Assessment Note    Patient Details  Name: Rachael [name] MRN: [id number] Date of Birth: Jan 17, 2001  Transition of Care [facility]) CM/SW Contact:    Milas Gain, [place] Phone Number: 04/01/2022, 4:48 PM  Clinical Narrative:                  CSW received consult for possible Vent/SNF placement at time of discharge. Due to patients current orientation CSW spoke with [name] regarding PT recommendation of SNF placement at time of discharge. Patients mother reports PTA patient came from home with her and patients 2 year child. Patients mother expressed understanding of PT recommendation and is agreeable to Vent/SNF placement for patient at time of discharge. Patients mother agreeable to [facility] fax out referral for Vent/SNF placement to [facility] and [facility] when patient is close to being medically ready for dc.CSW discussed insurance authorization process.No further questions reported at this time. CSW to continue to follow and assist with discharge planning needs.    Expected Discharge Plan: Skilled Nursing Facility Barriers to Discharge: Continued Medical Work up   Patient Goals and CMS Choice Patient states their goals for this hospitalization and ongoing recovery are:: wants dtr to recover CMS Medicare.gov Compare Post Acute Care list provided to:: Patient Represenative (must comment) (Mother- Rachael [name]) Choice offered to / list presented to : Parent (Patients mother)      Expected Discharge Plan and Services In-house Referral: Clinical Social Work Discharge Planning Services: CM Consult   Living arrangements for the past 2 months: Single Family Home                                      Prior Living Arrangements/Services Living arrangements for the past 2 months: Single Family Home Lives with:: Parents (Mother) Patient language and need for interpreter reviewed:: Yes Do you feel safe going back to the place where you live?:  Yes      Need for Family Participation in Patient Care: Yes (Comment) Care giver support system in place?: Yes (comment)   Criminal Activity/Legal Involvement Pertinent to Current Situation/Hospitalization: No - Comment as needed  Activities of Daily Living      Permission Sought/Granted Permission sought to share information with : Case Manager, Family Supports, Chartered certified accountant granted to share information with : No  Share Information with NAME: Due to patients current orientation CSW spoke with patients mother  Permission granted to share info w AGENCY: Due to patients current orientation CSW spoke with patients mother/Vent/SNF  Permission granted to share info w Relationship: Due to patients current orientation CSW spoke with patients mother  Permission granted to share info w Contact Information: Due to patients current orientation CSW spoke with patients mother [phone]  Emotional Assessment   Attitude/Demeanor/Rapport: Intubated (Following Commands or Not Following Commands)   Orientation: :  (intubated/trach) Alcohol / Substance Use: Not Applicable Psych Involvement: No (comment)  Admission diagnosis:  Respiratory acidosis [E87.29] Altered mental state [R41.82] Hypothermia, initial encounter [T68.XXXA] Community acquired pneumonia, unspecified laterality [J18.9] Overdose of undetermined intent, initial encounter [T50.904A] Patient Active Problem List   Diagnosis Date Noted   Hypothermia 03/25/2022   Community acquired pneumonia 03/25/2022   Cerebrovascular accident (CVA) ([place]) 03/23/2022   Chronic respiratory failure with hypoxia and hypercapnia ([place]) 03/22/2022   Paralysis ([place]) 03/20/2022   Altered mental state 03/19/2022   Vaginal delivery 11/16/2019   IUGR (  intrauterine growth restriction) affecting care of mother, third trimester, fetus 1 11/15/2019   Pregnancy affected by fetal growth restriction 10/08/2019   Supervision of normal  pregnancy, antepartum 05/15/2019   High risk sexual behavior in adolescent    Pyelonephritis 06/21/2017   Suicidal ideation 04/02/2016   MDD (major depressive disorder) 04/01/2016   PCP:  Patient, No Pcp Per Pharmacy:   [place], [address] [place] [address] Phone: [phone] Fax: [phone]     Social Determinants of Health (SDOH) Social History: SDOH Screenings   Food Insecurity: No Food Insecurity (03/23/2022)  Housing: Low Risk  (03/23/2022)  Transportation Needs: No Transportation Needs (03/23/2022)  Utilities: Not At Risk (03/23/2022)  Tobacco Use: Medium Risk (03/26/2022)   SDOH Interventions: Food Insecurity Interventions: Intervention Not Indicated Transportation Interventions: Intervention Not Indicated Utilities Interventions: Intervention Not Indicated   Readmission Risk Interventions     No data to display

## 2022-04-01 NOTE — Plan of Care (Signed)
Problem: Education: Goal: Knowledge of General Education information will improve Description: Including pain rating scale, medication(s)/side effects and non-pharmacologic comfort measures Outcome: Progressing  Problem: Nutrition: Goal: Adequate nutrition will be maintained Outcome: Progressing  Problem: Elimination: Goal: Will not experience complications related to urinary retention Outcome: Progressing   Problem: Education: Goal: Knowledge of disease or condition will improve Outcome: Progressing   Problem: Education: Goal: Knowledge of secondary prevention will improve Outcome: Progressing   Problem: Education: Goal: Knowledge of patient specific risk factors will improve  Outcome: Progressing

## 2022-04-02 ENCOUNTER — Inpatient Hospital Stay (HOSPITAL_COMMUNITY): Payer: Medicaid Other

## 2022-04-02 DIAGNOSIS — R4182 Altered mental status, unspecified: Secondary | ICD-10-CM | POA: Diagnosis not present

## 2022-04-02 LAB — MAGNESIUM: Magnesium: 2.1 mg/dL (ref 1.7–2.4)

## 2022-04-02 LAB — CULTURE, RESPIRATORY W GRAM STAIN: Culture: NORMAL

## 2022-04-02 LAB — VANCOMYCIN, TROUGH: Vancomycin Tr: 12 ug/mL — ABNORMAL LOW (ref 15–20)

## 2022-04-02 LAB — BASIC METABOLIC PANEL
Anion gap: 11 (ref 5–15)
BUN: 14 mg/dL (ref 6–20)
CO2: 26 mmol/L (ref 22–32)
Calcium: 8.4 mg/dL — ABNORMAL LOW (ref 8.9–10.3)
Chloride: 97 mmol/L — ABNORMAL LOW (ref 98–111)
Creatinine, Ser: 0.69 mg/dL (ref 0.44–1.00)
GFR, Estimated: >60 mL/min (ref >60)
Glucose, Bld: 119 mg/dL — ABNORMAL HIGH (ref 70–99)
Potassium: 4 mmol/L (ref 3.5–5.1)
Sodium: 134 mmol/L — ABNORMAL LOW (ref 135–145)

## 2022-04-02 LAB — GLUCOSE, CAPILLARY
Glucose-Capillary: 133 mg/dL — ABNORMAL HIGH (ref 70–99)
Glucose-Capillary: 150 mg/dL — ABNORMAL HIGH (ref 70–99)
Glucose-Capillary: 143 mg/dL — ABNORMAL HIGH (ref 70–99)
Glucose-Capillary: 184 mg/dL — ABNORMAL HIGH (ref 70–99)
Glucose-Capillary: 144 mg/dL — ABNORMAL HIGH (ref 70–99)

## 2022-04-02 LAB — HEPATIC FUNCTION PANEL
ALT: 134 U/L — ABNORMAL HIGH (ref 0–44)
AST: 51 U/L — ABNORMAL HIGH (ref 15–41)
Albumin: 2.3 g/dL — ABNORMAL LOW (ref 3.5–5.0)
Alkaline Phosphatase: 77 U/L (ref 38–126)
Bilirubin, Direct: <0.1 mg/dL (ref 0.0–0.2)
Total Bilirubin: 0.6 mg/dL (ref 0.3–1.2)
Total Protein: 6.4 g/dL — ABNORMAL LOW (ref 6.5–8.1)

## 2022-04-02 LAB — CBC
HCT: 28.8 % — ABNORMAL LOW (ref 36.0–46.0)
Hemoglobin: 8.9 g/dL — ABNORMAL LOW (ref 12.0–15.0)
MCH: 27.4 pg (ref 26.0–34.0)
MCHC: 30.9 g/dL (ref 30.0–36.0)
MCV: 88.6 fL (ref 80.0–100.0)
Platelets: 299 10*3/uL (ref 150–400)
RBC: 3.25 MIL/uL — ABNORMAL LOW (ref 3.87–5.11)
RDW: 14.5 % (ref 11.5–15.5)
WBC: 18.6 10*3/uL — ABNORMAL HIGH (ref 4.0–10.5)
nRBC: 0 % (ref 0.0–0.2)

## 2022-04-02 LAB — PHOSPHORUS: Phosphorus: 4.8 mg/dL — ABNORMAL HIGH (ref 2.5–4.6)

## 2022-04-02 MED ORDER — ACETAMINOPHEN 325 MG PO TABS: 650.0000 mg | ORAL_TABLET | Freq: Four times a day (QID) | ORAL | Status: DC | PRN

## 2022-04-02 MED ORDER — VANCOMYCIN HCL 1250 MG/250ML IV SOLN
1250.0000 mg | Freq: Three times a day (TID) | INTRAVENOUS | Status: DC
  Filled 2022-04-02: qty 250

## 2022-04-02 MED ORDER — ACETAMINOPHEN 160 MG/5ML PO SOLN
650.0000 mg | Freq: Four times a day (QID) | ORAL | Status: DC | PRN
  Administered 2022-04-02 – 2022-04-08 (×7): 650 mg
  Filled 2022-04-02 (×6): qty 20.3

## 2022-04-02 MED ORDER — ACETAMINOPHEN 160 MG/5ML PO SOLN: 650.0000 mg | Freq: Four times a day (QID) | ORAL | Status: DC | PRN

## 2022-04-02 NOTE — TOC Progression Note (Signed)
Transition of Care [facility]) - Progression Note    Patient Details  Name: Euniqua Kalista MRN: [id number] Date of Birth: [birth year]  Transition of Care [facility]) CM/SW Contact  Marcheta Grammes Rexene Alberts, RN Phone Number: 04/02/2022, 1:17 PM  Clinical Narrative:     Received call from [facility], [place] [phone], states she can assist with CAP-D, explained plan is for Vent SNF at dc. CSW will work on placement.   Expected Discharge Plan: [place] Barriers to Discharge: Continued Medical Work up  Expected Discharge Plan and Services In-house Referral: Clinical Social Work Discharge Planning Services: CM Consult   Living arrangements for the past 2 months: Single Family Home                                       Social Determinants of Health (SDOH) Interventions SDOH Screenings   Food Insecurity: No Food Insecurity (03/23/2022)  Housing: Low Risk  (03/23/2022)  Transportation Needs: No Transportation Needs (03/23/2022)  Utilities: Not At Risk (03/23/2022)  Tobacco Use: Medium Risk (03/26/2022)    Readmission Risk Interventions     No data to display

## 2022-04-02 NOTE — Progress Notes (Signed)
Speech Language Pathology Treatment: Dysphagia  Patient Details Name: Rachael [name] MRN: [id number] DOB: April 14, 2000 Today's Date: 04/02/2022 Time: [id number] SLP Time Calculation (min) (ACUTE ONLY): 16 min  Assessment / Plan / Recommendation Clinical Impression  F/u after FEES to discuss results with Rachael [name] and Rachael [name].  We talked about Rachael risk of aspiration given reduced flow of liquids through UES and into esophagus, leading to pooling of liquids/secretions in the pharyngeal space surrounding Rachael airway.  We discussed the circumstances that can reduce risk but not fully prevent aspiration: 1) drinking when cuff is partially deflated to allow better flow through cervical esophagus; 2) swallowing 4-5 times after each sip to help clear liquid/secretions; 3) limiting intake to water, which may be less harmful to lungs than soda, tea, etc. if there is an instance of aspiration.  Rachael [name] and Rachael [name] verbalized understanding and pt asserted Rachael strong desire to drink water, knowing the risks of aspiration and its adverse consequences.  She was amenable to trying inline valve again next week.   Discussed results with RN directly and with Dr. Valeta Harms and B. Moshe Cipro, NP, via [employer].  SLP will follow.   HPI HPI: Rachael [name] is a 22 y.o. female with PMH significant for depression, HLD, Kidney abscess who presents with acute arms and leg weakness, dyspnea and hypercarbia.  Pt was intubated on 2/16; trach 2/19; PEG 2/23. 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      SLP Plan  Continue with current plan of care      Recommendations for follow up therapy are one component of a multi-disciplinary discharge planning process, led by the attending physician.  Recommendations may be updated based on patient status, additional functional criteria and insurance authorization.    Recommendations  Diet  recommendations: Thin liquid Medication Administration: Via alternative means Supervision: Full supervision/cueing for compensatory strategies Compensations: Multiple dry swallows after each bite/sip      PMSV Supervision: Full         Oral Care Recommendations: Oral care QID Follow Up Recommendations: Long-term institutional care without follow-up therapy Assistance recommended at discharge: Frequent or constant Supervision/Assistance SLP Visit Diagnosis: Dysphagia, pharyngeal phase (R13.13) Plan: Continue with current plan of care         Rachael [name] L. Tivis Ringer, MA CCC/SLP Clinical Specialist - Acute Care SLP Acute Rehabilitation Services Office number [phone]   Rachael [name]  04/02/2022, 2:21 PM

## 2022-04-02 NOTE — Progress Notes (Signed)
NAME:  Rachael [name], MRN:  [id number], DOB:  Jan 18, 2001, LOS: 45 ADMISSION DATE:  03/19/2022, CONSULTATION DATE:  04/02/22 REFERRING MD:  [place], CHIEF COMPLAINT:  shortness of breath and weakness   History of Present Illness:  Rachael [name] is a 22 y.o. F with history of depression and suicidal ideation who presented to [place] ED with sudden onset of shortness of breath and upper and lower extremity weakness.  History taken from Epic as patient is intubated and no family is at the bedside.  She initially had a headache and back pain prior to onset but denied abdominal pain or chest pain.  Vitals showed an initial temp of 49F, bloodwork with WBC 18k, UDS + for bezos and THC.   Her symptoms progressed to the point she wasn't speaking and ABG showed hypercarbic respiratory failure so pt was intubated.  She had some bradycardia and hypotension post-intubation, so was started on peripheral Levophed and given atropine during transport   Pertinent  Medical History   has a past medical history of Depression, Hyperlipidemia, and Kidney abscess.   Significant Hospital Events: Including procedures, antibiotic start and stop dates in addition to other pertinent events   2/16 presented to DB with all weakness and shortness of breath requiring intubation and transfer to [facility] 2/18 episodes of desaturations and hypercarbia unexplained 2/19 trach, cortrak, PICC 2/23 therapeutic bronch 2/24 Eikenella corrodens BAL. Not triggering vent  2/25 narrow abx from vanc zosyn to rocephin. Will reach out to OSH re transfer and consideration of diaphragmatic pacemaker - unfortunately no beds at [facility] and declined transfer at [facility] 2/26 fevers, abx broadened> vanc/ merrem, diuresing 2/27 chest CT, MRI brain, ongoing fevers, did not tolerate/ like PMV 2/28 trach aspirate resent as 2/26 neg.  Continued fevers.  Episode of vomiting after large bolus feeding.  Remains on 70% 2/29 ongoing fevers, working with SLP  for [place]  Interim History / Subjective:  102.2 tmax   No events Down to 40% FiO2  FEES scheduled bedside today at noon  Objective   Blood pressure 109/62, pulse 75, temperature 99.5 F (37.5 C), resp. rate 20, height '5\' 7"'$  (1.702 m), weight 96.5 kg, SpO2 98 %.    Vent Mode: PRVC FiO2 (%):  [50 %-70 %] 50 % Set Rate:  [20 bmp] 20 bmp Vt Set:  [490 mL] 490 mL PEEP:  [8 cmH20] 8 cmH20 Plateau Pressure:  [20 cmH20-24 cmH20] 23 cmH20   Intake/Output Summary (Last 24 hours) at 04/02/2022 0708 Last data filed at 04/02/2022 0700 Gross per 24 hour  Intake 2956.34 ml  Output 5840 ml  Net -2883.66 ml   Filed Weights   03/31/22 0500 04/01/22 0353 04/02/22 0339  Weight: 97.9 kg 97.1 kg 96.5 kg   General:  young adult female lying in bed in NAD HEENT: MM pink/moist, mild line cuffed trach Neuro: opens eyes, mouths words/ appropriate, quad CV: rr, no murmur PULM:  non labored on full MV, clear, no wheeze GI: soft, +bs, ND, PEG, foley- cyu, FMS  Extremities: warm/dry, generalized edema, LUE PICC Skin: no rashes   UOP 5.4L/ 24hrs -2.8L/ 24hrs  Net -11.6L Stool 500> 440> 440 ml/ 24hrs   2/28 trach asp>  2/26 trach asp> normal flora 2/24 BCX2> ngtd   Labs reviewed> WBC 22.3> 18.6, H/H stable, LFTs slightly improved   CXR> better aeration of left lung, stable trach/ PICC  Assessment & Plan:    Goals of care / DNR status  - clear goals of care --  willing to try aggressive care for a couple of months (3) but if no meaningful improvement in functional status this quality of life is not acceptable for the patient and she would want transition to comfort care. Mom and medical team are supportive, and will continue to help facilitate best shot at recovery during this period of time.  PMT following, appreciate assistance   Acute hypoxic and hypercarbic respiratory failure 2/2 NM weakness s/p trach  Eikenella corrodens HCAP> 2/23, likely ongoing aspiration  Mucous plugging  Dysphagia --  s/p PEG  - cont full MV support, LTVV - cont to wean FiO2 as able - ongoing aggressive pulmonary hygiene> CPT, HTS, metanebs, left lung up for postural drainage - consider cough assist device when available  - trach care - ongoing SLP efforts with PMV and swallow eval.   - small amounts of ice chips, sips of water for now as this is a main source of pleasure for her and dry mouth.  Pt and mother aware of ongoing risk associated with.  FEES today to better evaluate swallowing/ aspiration risk - 2/28 trach asp negative - intermittent CXR - abx as below - PCCM attempted calls to [facility] and [facility] 2/25 for consideration transfer for diaphragmatic pacemaker and ongoing care, unfortuantely no beds [facility] and declined at [facility].    Fevers, leukocytosis  - fevers ongoing, suspected to be related to dense left PNA, suspected ongoing aspiration as no other obvious source.  WBC  better today.  LFTs improved after stopping statin.   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 Vertical nystagmus- potentially related to known anterior cervical spinal artery infarction - appreciate neuro involvement - MRI brain 2/27> evolving infarct of upper cervical cord, no new lesions - cont DAPT x 3 weeks, then ASA - cont aggressive PT/ OT/ SLP - ongoing supportive care  - bowel regimen - holding statin given elevated LFTs  Volume overload Neurogenic bladder - diuresed well yesterday, overall net -11L,  pending BMET - cont foley - Trend BMP / urinary output - Replace electrolytes as indicated - Avoid nephrotoxic agents, ensure adequate renal perfusion   Hyponatremia - cont FWF 100 ml   q8hr - trend BMET   Anemia - transfuse for HB <7 or hemodynamically significant bleeding - decrease frequency of labs   At risk for malnutrition PEG N/V 2/28 - vomiting episode likely attributed to too large of bolus feed given with free water (550-600 ml).  No further episodes since with cont TF.  Pending FEES results today to determine further RD recs.  Appreciate assistance   Hyperglycemia, stable  - SSI PRN - goal BG 140-180   Diarrhea - banatrol TF QID    CC time: 30 min    Kennieth Rad, MSN, AG-ACNP-BC [facility] 04/02/2022, 7:08 AM  See Amion for pager If no response to pager, please call PCCM consult pager After 7:00 pm call [place]

## 2022-04-02 NOTE — Procedures (Signed)
Objective Swallowing Evaluation: Type of Study: FEES-Fiberoptic Endoscopic Evaluation of Swallow   Patient Details  Name: Rachael [name] MRN: [id number] Date of Birth: Feb 13, 2000  Today's Date: 04/02/2022 Time: SLP Start Time (ACUTE ONLY): 1228 -SLP Stop Time (ACUTE ONLY): 1245  SLP Time Calculation (min) (ACUTE ONLY): 17 min   Past Medical History:  Past Medical History:  Diagnosis Date   Depression    few yrs ago ([place]), doing good now   Hyperlipidemia    Kidney abscess    Past Surgical History:  Past Surgical History:  Procedure Laterality Date   IR GASTROSTOMY TUBE MOD SED  03/26/2022   NO PAST SURGERIES     HPI: Rachael [name] is a 22 y.o. female with PMH significant for depression, HLD, Kidney abscess who presents with acute arms and leg weakness, dyspnea and hypercarbia.  Pt was intubated on 2/16; trach 2/19; PEG 2/23. 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   Subjective: "want some water and food"    Recommendations for follow up therapy are one component of a multi-disciplinary discharge planning process, led by the attending physician.  Recommendations may be updated based on patient status, additional functional criteria and insurance authorization.  Assessment / Plan / Recommendation     04/02/2022    1:00 PM  Clinical Impressions  Clinical Impression FEES completed while pt on the ventilator, mildly lethargic, reclined. Initial visualization showed pooled pharyngeal secretions making visibility to pharynx difficult. With cues to swallow pt able to partially clear pharynx, showing a very narrow pharyngeal space; base of tongue, epiglottis and posterior pharyngeal wall in contact at rest. Airway only partially visible during speech. Attempted to sit pt up more and then even position head in a chin tuck position to improve visibility of vestibule and glottis with little success. Tissue  did appear healthy and secretions were clear. When administered initial sips of nectar thick liquids, pt exhibited delayed swallow initiation with bolus arriving to vestibule. Multiple propulsive efforts needed to pass bolus into cervical esophagus with mild backflow. Backflow and ease of transit improved with thin liquids and partially deflated cuff. Likely there is a combination of pressure on the esophagus by the inflated cuff as well as decreased hyoid excursion and UES opening resulting from brainstem CVA.   Given anatomy, comprehensive diagnosis of physiology very difficult. Regardless, pt did not appear to aspirate with sips of thin liquids with cuff partially deflated. She will be at risk of aspiration of secretions and pooled liquids if swallowing while ventilated, with cuff inflated or deflated. If pt elects to attempt PO regardless of risk, recommend water only as well as reminders for multiple swallows to clear pharynx prior to sipping, and multiple swallows after sips again to clear. Partial cuff deflation could improve ability to clear pharynx with airflow.  Logistically this may not be feasible. Will follow closely to facilitate safety as able.  SLP Visit Diagnosis Dysphagia, pharyngeal phase (R13.13)  Impact on safety and function Moderate aspiration risk         04/02/2022    1:00 PM  Treatment Recommendations  Treatment Recommendations Therapy as outlined in treatment plan below         No data to display             04/02/2022    1:00 PM  Diet Recommendations  SLP Diet Recommendations Free water protocol after oral care  Liquid Administration via Cup;Straw  Medication Administration  Via alternative means  Compensations Multiple dry swallows after each bite/sip         04/02/2022    1:00 PM  Other Recommendations  Oral Care Recommendations Oral care QID  Follow Up Recommendations Long-term institutional care without follow-up therapy  Assistance recommended at  discharge Frequent or constant Supervision/Assistance  Functional Status Assessment Patient has had a recent decline in their functional status and demonstrates the ability to make significant improvements in function in a reasonable and predictable amount of time.       04/02/2022    1:00 PM  Frequency and Duration   Speech Therapy Frequency (ACUTE ONLY) min 2x/week  Treatment Duration 2 weeks         04/02/2022    1:00 PM  Oral Phase  Oral Phase [facility]       04/02/2022    1:00 PM  Pharyngeal Phase  Pharyngeal Phase Impaired  Pharyngeal- Nectar Teaspoon Delayed swallow initiation-vallecula;Delayed swallow initiation-pyriform sinuses;Reduced epiglottic inversion;Pharyngeal residue - pyriform  Pharyngeal- Thin Cup Delayed swallow initiation-vallecula;Delayed swallow initiation-pyriform sinuses;Reduced epiglottic inversion;Pharyngeal residue - pyriform         No data to display          Herbie Baltimore, MA CCC-SLP  Acute Rehabilitation Services Secure Chat Preferred Office [phone]  Lynann Beaver 04/02/2022, 1:50 PM

## 2022-04-02 NOTE — Progress Notes (Signed)
Pharmacy Antibiotic Note  Rachael [name] is a 22 y.o. 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 Intubated, sedated and started on Unasyn for pneumonia. ABX ultimately broadened with ongoing fevers. Cr has remained stable, VT this morning is low at 12 mcg/ml and true trough likely closer to 10 mcg/ml.   Plan: Increase vancomycin to '1250mg'$  IV q8h Meropenem 1gm IV q8h  Follow Cr, LOT, Cx   Height: '5\' 7"'$  (170.2 cm) Weight: 96.5 kg (212 lb 11.9 oz) IBW/kg (Calculated) : 61.6  Temp (24hrs), Avg:101.2 F (38.4 C), Min:99.5 F (37.5 C), Max:102.4 F (39.1 C)  Recent Labs  Lab 03/28/22 0423 03/29/22 0532 03/30/22 0545 03/31/22 0500 04/01/22 0402 04/02/22 0401  WBC 16.0* 15.8* 19.4*  --  22.3* 18.6*  CREATININE 0.81 0.73 0.75 0.76 0.78  --   VANCOTROUGH  --   --   --   --   --  12*     Estimated Creatinine Clearance: 132.8 mL/min (by C-G formula based on SCr of 0.78 mg/dL).    No Known Allergies  Antimicrobials this admission: Unasyn 2/21>2/23 Vanc 2/23>2/25  restart 2/26 Zosyn 2/23>2/25 Ceftriax 2/25>2/26 Meropenem 2/26>   Microbiology results: 2/23 Bronch EIKENELLA CORRODENS  2/19 TA ngtd 2/16 BCx: ngtd 2/16 MRSA PCR: neg   Arrie Senate, PharmD, BCPS, [facility] Clinical Pharmacist [phone] Please check AMION for all [facility] Pharmacy numbers 04/02/2022

## 2022-04-02 NOTE — Progress Notes (Signed)
Pt febrile to 39 degrees C (see flowsheet).  PRN acetaminophen given, blankets removed.  Pt adamantly refuses ice packs.

## 2022-04-02 DEATH — deceased

## 2022-04-03 DIAGNOSIS — J189 Pneumonia, unspecified organism: Secondary | ICD-10-CM | POA: Diagnosis not present

## 2022-04-03 DIAGNOSIS — R509 Fever, unspecified: Secondary | ICD-10-CM

## 2022-04-03 DIAGNOSIS — G9511 Acute infarction of spinal cord (embolic) (nonembolic): Secondary | ICD-10-CM

## 2022-04-03 DIAGNOSIS — J9601 Acute respiratory failure with hypoxia: Secondary | ICD-10-CM

## 2022-04-03 LAB — HEPATIC FUNCTION PANEL
ALT: 160 U/L — ABNORMAL HIGH (ref 0–44)
AST: 64 U/L — ABNORMAL HIGH (ref 15–41)
Albumin: 2.5 g/dL — ABNORMAL LOW (ref 3.5–5.0)
Alkaline Phosphatase: 80 U/L (ref 38–126)
Bilirubin, Direct: 0.1 mg/dL (ref 0.0–0.2)
Indirect Bilirubin: 0.8 mg/dL (ref 0.3–0.9)
Total Bilirubin: 0.9 mg/dL (ref 0.3–1.2)
Total Protein: 7.1 g/dL (ref 6.5–8.1)

## 2022-04-03 LAB — CBC
HCT: 31.7 % — ABNORMAL LOW (ref 36.0–46.0)
Hemoglobin: 10.1 g/dL — ABNORMAL LOW (ref 12.0–15.0)
MCH: 27.7 pg (ref 26.0–34.0)
MCHC: 31.9 g/dL (ref 30.0–36.0)
MCV: 87.1 fL (ref 80.0–100.0)
Platelets: 354 10*3/uL (ref 150–400)
RBC: 3.64 MIL/uL — ABNORMAL LOW (ref 3.87–5.11)
RDW: 14.4 % (ref 11.5–15.5)
WBC: 20.5 10*3/uL — ABNORMAL HIGH (ref 4.0–10.5)
nRBC: 0 % (ref 0.0–0.2)

## 2022-04-03 LAB — RENAL FUNCTION PANEL
Albumin: 2.6 g/dL — ABNORMAL LOW (ref 3.5–5.0)
Anion gap: 13 (ref 5–15)
BUN: 23 mg/dL — ABNORMAL HIGH (ref 6–20)
CO2: 28 mmol/L (ref 22–32)
Calcium: 9.3 mg/dL (ref 8.9–10.3)
Chloride: 91 mmol/L — ABNORMAL LOW (ref 98–111)
Creatinine, Ser: 0.84 mg/dL (ref 0.44–1.00)
GFR, Estimated: >60 mL/min (ref >60)
Glucose, Bld: 134 mg/dL — ABNORMAL HIGH (ref 70–99)
Phosphorus: 4.6 mg/dL (ref 2.5–4.6)
Potassium: 3.7 mmol/L (ref 3.5–5.1)
Sodium: 132 mmol/L — ABNORMAL LOW (ref 135–145)

## 2022-04-03 LAB — GLUCOSE, CAPILLARY
Glucose-Capillary: 130 mg/dL — ABNORMAL HIGH (ref 70–99)
Glucose-Capillary: 137 mg/dL — ABNORMAL HIGH (ref 70–99)
Glucose-Capillary: 140 mg/dL — ABNORMAL HIGH (ref 70–99)
Glucose-Capillary: 156 mg/dL — ABNORMAL HIGH (ref 70–99)
Glucose-Capillary: 162 mg/dL — ABNORMAL HIGH (ref 70–99)
Glucose-Capillary: 168 mg/dL — ABNORMAL HIGH (ref 70–99)
Glucose-Capillary: 173 mg/dL — ABNORMAL HIGH (ref 70–99)

## 2022-04-03 MED ORDER — POTASSIUM CHLORIDE 20 MEQ PO PACK
20.0000 meq | PACK | Freq: Once | ORAL | Status: AC
  Administered 2022-04-03: 20 meq
  Filled 2022-04-03: qty 1

## 2022-04-03 MED ORDER — FREE WATER
100.0000 mL | Freq: Two times a day (BID) | Status: DC
  Administered 2022-04-03 – 2022-04-09 (×13): 100 mL

## 2022-04-03 NOTE — Progress Notes (Signed)
NAME:  Rachael [name], MRN:  [id number], DOB:  02/15/2000, LOS: 29 ADMISSION DATE:  03/19/2022, CONSULTATION DATE:  04/03/22 REFERRING MD:  drawbridge, CHIEF COMPLAINT:  shortness of breath and weakness   History of Present Illness:  Rachael [name] is a 22 y.o. F with history of depression and suicidal ideation who presented to [facility] ED with sudden onset of shortness of breath and upper and lower extremity weakness.  History taken from Epic as patient is intubated and no family is at the bedside.  She initially had a headache and back pain prior to onset but denied abdominal pain or chest pain.  Vitals showed an initial temp of 41F, bloodwork with WBC 18k, UDS + for bezos and THC.   Her symptoms progressed to the point she wasn't speaking and ABG showed hypercarbic respiratory failure so pt was intubated.  She had some bradycardia and hypotension post-intubation, so was started on peripheral Levophed and given atropine during transport   Pertinent  Medical History   has a past medical history of Depression, Hyperlipidemia, and Kidney abscess.   Significant Hospital Events: Including procedures, antibiotic start and stop dates in addition to other pertinent events   2/16 presented to DB with all weakness and shortness of breath requiring intubation and transfer to [facility] 2/18 episodes of desaturations and hypercarbia unexplained 2/19 trach, cortrak, PICC 2/23 therapeutic bronch 2/24 Eikenella corrodens BAL. Not triggering vent  2/25 narrow abx from vanc zosyn to rocephin. Will reach out to OSH re transfer and consideration of diaphragmatic pacemaker - unfortunately no beds at [facility] and declined transfer at [facility] 2/26 fevers, abx broadened> vanc/ merrem, diuresing 2/27 chest CT, MRI brain, ongoing fevers, did not tolerate/ like PMV 2/28 trach aspirate resent as 2/26 neg.  Continued fevers.  Episode of vomiting after large bolus feeding.  Remains on 70% 2/29 ongoing fevers, working with SLP  for [place]  Interim History / Subjective:  [name] with a movie on today   Still fevering   Objective   Blood pressure 133/79, pulse (!) 101, temperature (!) 101.3 F (38.5 C), resp. rate 20, height '5\' 7"'$  (1.702 m), weight 96.5 kg, SpO2 95 %.    Vent Mode: PRVC FiO2 (%):  [40 %] 40 % Set Rate:  [20 bmp] 20 bmp Vt Set:  [490 mL] 490 mL PEEP:  [8 cmH20] 8 cmH20 Plateau Pressure:  [20 cmH20-22 cmH20] 20 cmH20   Intake/Output Summary (Last 24 hours) at 04/03/2022 1043 Last data filed at 04/03/2022 0700 Gross per 24 hour  Intake 2164.2 ml  Output 1415 ml  Net 749.2 ml   Filed Weights   04/01/22 0353 04/02/22 0339 04/03/22 0500  Weight: 97.1 kg 96.5 kg 96.5 kg   General:  WDWN young adult F, trach/vent  HEENT: NCAT trach secure  Neuro: Asleep, awakens to voice  CV: rrr  PULM:  Wheezes. Mechanically ventilated  GI: soft ndnt  Extremities: edematous  Skin: c/d/w    Assessment & Plan:   GOC / DNR status -clear GOC -- aggressive care/rehab x13mo If Qol not acceptable at that point, transition to comfort care. Mom and medical team are supportive, and will continue to help facilitate best shot at recovery during this period of time.  PMT following, appreciate assistance   Acute hypoxic and hypercarbic resp failure S/p trach Vent dependent resp failure  Eikenella corrodens HCAP 2/23  Ongoing aspiration  Mucous plugging Dysphagia s/p PEG  - [facility]  [facility] for diaphragmatic pacemaker and ongoing care, unfortuantely no beds [facility] and declined at [facility].   -think we can expect a degree of dead space 2/2 atelectasis always  P -cont MV support, not sure that we will be able to liberate from vent. Cont progression attempts  -cont pulm hygiene   -cough assist device when available  -routine trach care  -Small sips & ice chips are source of joy for pt -- this is aspiration risk but in context of everything this is weighed  risk  -EN via PEG   Fevers Leukocytosis  -felt that this is 2/2 PNA  P -cont mero  -consider PICC holiday (2d old) -could consider dc foley (11d old) with plan for [facility] I/O    Situational depression Anxiety Disrupted sleep cycle Hx tobacco use Hx mj  P - cont zoloft, melatonin, trazodone, prn ambien - cont clonzaepam tid  - delirium precautions, avoid unnecessary nighttime baths/ waking  Anterior spinal artery infarct, to level of C7  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 If concern for infection, dc and trial [facility] I/O   Hyponatremia  -decr FWF to 100q12    Anemia - transfuse for HB <7 or hemodynamically significant bleeding - decrease frequency of labs   Hyperglycemia, stable  - SSI PRN - goal BG 140-180   Diarrhea - banatrol TF QID  CRITICAL CARE Performed by: Cristal Generous   Total critical care time: 39 minutes  Critical care time was exclusive of separately billable procedures and treating other patients. Critical care was necessary to treat or prevent imminent or life-threatening deterioration.  Critical care was time spent personally by me on the following activities: development of treatment plan with patient and/or surrogate as well as nursing, discussions with consultants, evaluation of patient's response to treatment, examination of patient, obtaining history from patient or surrogate, ordering and performing treatments and interventions, ordering and review of laboratory studies, ordering and review of radiographic studies, pulse oximetry and re-evaluation of patient's condition.  Eliseo Gum MSN, AGACNP-BC [place]  for pager  04/03/2022, 10:43 AM

## 2022-04-04 ENCOUNTER — Inpatient Hospital Stay (HOSPITAL_COMMUNITY): Payer: Medicaid Other

## 2022-04-04 DIAGNOSIS — N179 Acute kidney failure, unspecified: Secondary | ICD-10-CM

## 2022-04-04 LAB — BASIC METABOLIC PANEL
Anion gap: 13 (ref 5–15)
Anion gap: 17 — ABNORMAL HIGH (ref 5–15)
Anion gap: 10 (ref 5–15)
Anion gap: 9 (ref 5–15)
BUN: 67 mg/dL — ABNORMAL HIGH (ref 6–20)
BUN: 69 mg/dL — ABNORMAL HIGH (ref 6–20)
BUN: 59 mg/dL — ABNORMAL HIGH (ref 6–20)
BUN: 42 mg/dL — ABNORMAL HIGH (ref 6–20)
CO2: 23 mmol/L (ref 22–32)
CO2: 18 mmol/L — ABNORMAL LOW (ref 22–32)
CO2: 27 mmol/L (ref 22–32)
CO2: 29 mmol/L (ref 22–32)
Calcium: 8.7 mg/dL — ABNORMAL LOW (ref 8.9–10.3)
Calcium: 8.6 mg/dL — ABNORMAL LOW (ref 8.9–10.3)
Calcium: 9.2 mg/dL (ref 8.9–10.3)
Calcium: 8.8 mg/dL — ABNORMAL LOW (ref 8.9–10.3)
Chloride: 90 mmol/L — ABNORMAL LOW (ref 98–111)
Chloride: 90 mmol/L — ABNORMAL LOW (ref 98–111)
Chloride: 96 mmol/L — ABNORMAL LOW (ref 98–111)
Chloride: 96 mmol/L — ABNORMAL LOW (ref 98–111)
Creatinine, Ser: 2.95 mg/dL — ABNORMAL HIGH (ref 0.44–1.00)
Creatinine, Ser: 3.15 mg/dL — ABNORMAL HIGH (ref 0.44–1.00)
Creatinine, Ser: 2.04 mg/dL — ABNORMAL HIGH (ref 0.44–1.00)
Creatinine, Ser: 1.11 mg/dL — ABNORMAL HIGH (ref 0.44–1.00)
GFR, Estimated: 22 mL/min — ABNORMAL LOW (ref >60)
GFR, Estimated: 21 mL/min — ABNORMAL LOW (ref >60)
GFR, Estimated: 35 mL/min — ABNORMAL LOW (ref >60)
GFR, Estimated: >60 mL/min (ref >60)
Glucose, Bld: 163 mg/dL — ABNORMAL HIGH (ref 70–99)
Glucose, Bld: 150 mg/dL — ABNORMAL HIGH (ref 70–99)
Glucose, Bld: 177 mg/dL — ABNORMAL HIGH (ref 70–99)
Glucose, Bld: 225 mg/dL — ABNORMAL HIGH (ref 70–99)
Potassium: 4.2 mmol/L (ref 3.5–5.1)
Potassium: 4.2 mmol/L (ref 3.5–5.1)
Potassium: 3.8 mmol/L (ref 3.5–5.1)
Potassium: 3.3 mmol/L — ABNORMAL LOW (ref 3.5–5.1)
Sodium: 126 mmol/L — ABNORMAL LOW (ref 135–145)
Sodium: 125 mmol/L — ABNORMAL LOW (ref 135–145)
Sodium: 133 mmol/L — ABNORMAL LOW (ref 135–145)
Sodium: 134 mmol/L — ABNORMAL LOW (ref 135–145)

## 2022-04-04 LAB — CBC
HCT: 29.1 % — ABNORMAL LOW (ref 36.0–46.0)
Hemoglobin: 9.7 g/dL — ABNORMAL LOW (ref 12.0–15.0)
MCH: 28.4 pg (ref 26.0–34.0)
MCHC: 33.3 g/dL (ref 30.0–36.0)
MCV: 85.1 fL (ref 80.0–100.0)
Platelets: 336 10*3/uL (ref 150–400)
RBC: 3.42 MIL/uL — ABNORMAL LOW (ref 3.87–5.11)
RDW: 14.5 % (ref 11.5–15.5)
WBC: 22.2 10*3/uL — ABNORMAL HIGH (ref 4.0–10.5)
nRBC: 0 % (ref 0.0–0.2)

## 2022-04-04 LAB — GLUCOSE, CAPILLARY
Glucose-Capillary: 163 mg/dL — ABNORMAL HIGH (ref 70–99)
Glucose-Capillary: 175 mg/dL — ABNORMAL HIGH (ref 70–99)
Glucose-Capillary: 195 mg/dL — ABNORMAL HIGH (ref 70–99)

## 2022-04-04 MED ORDER — POTASSIUM CHLORIDE 10 MEQ/50ML IV SOLN
10.0000 meq | INTRAVENOUS | Status: AC
  Administered 2022-04-04 – 2022-04-05 (×4): 10 meq via INTRAVENOUS
  Filled 2022-04-04 (×4): qty 50

## 2022-04-04 MED ORDER — POTASSIUM CHLORIDE 20 MEQ PO PACK
40.0000 meq | PACK | Freq: Once | ORAL | Status: AC
  Administered 2022-04-04: 40 meq
  Filled 2022-04-04: qty 2

## 2022-04-04 MED ORDER — SODIUM CHLORIDE 0.9 % IV BOLUS: 500.0000 mL | Freq: Once | INTRAVENOUS | Status: DC

## 2022-04-04 NOTE — Consult Note (Signed)
[place] for Infectious Disease       Reason for Consult:fever    Referring Physician: Dr. 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)   Acute respiratory failure with hypoxia (HCC)   Fever    aspirin  81 mg Per Tube Daily   Chlorhexidine Gluconate Cloth  6 each Topical Daily   clonazepam  0.5 mg Per Tube TID   clopidogrel  75 mg Per Tube Daily   vitamin B-12  1,000 mcg Per Tube Daily   enoxaparin (LOVENOX) injection  40 mg Subcutaneous Daily   famotidine  20 mg Per Tube BID   feeding supplement (PROSource TF20)  60 mL Per Tube TID   fiber supplement (BANATROL TF)  60 mL Per Tube QID   folic acid  1 mg Per Tube Daily   free water  100 mL Per Tube Q12H   multivitamin with minerals  1 tablet Per Tube Daily   mouth rinse  15 mL Mouth Rinse Q2H   sertraline  50 mg Per Tube Daily   sodium chloride flush  10-40 mL Intracatheter Q12H   sodium chloride HYPERTONIC  4 mL Nebulization Q4H   thiamine  100 mg Per Tube Daily   traZODone  100 mg Per Tube QHS    Recommendations: Continue meropenem Monitor fever and leukocytosis.    Assessment: Fever of unknown cause.  I do note that the trend is coming down now so will continue to monitor.  I do not see any particular source of infection but certainly her lung findings would be most likely though improving overall.   At this point, seems to be improving with meropenem and will continue.   PIcc line in place and looks good clinically but if she spikes will consider new blood cultures.     HPI: Rachael [name] is a 22 y.o. female with a history of depression and s/p C7 spinal artery infarct and resultant flaccid paralysis and now with a chronic trach and PEG with a  persistent fever.  She has been febrile since about 2/21 and has been on mulitple antibiotics since that time though no obvious infection noted.  She has been on antibiotics since 2/19 of various courses including vancomycin, ceftriaxone, ampicillin/sulbactam, pip/tazo and now day 7 of meropenem.  Leukocytosis has been increasing as well and up to 22.2 today.  She was examined with her mother at the bedside.  No significant changes or worsening in her vent settings.  She reports no new symptoms including no new breathing difficulty, no diarrhea or other concerns.   She has had a CT of her chest with left lung opacification and vollume loss and ground-glass opacity in the right upper lobe but much improved on 3/1 CXR.  Blood cultures from 2/24 have been negative.    Review of Systems:  Constitutional: negative for fevers and chills All other systems reviewed and are negative    Past Medical History:  Diagnosis Date   Depression    few yrs ago [facility]), doing good now   Hyperlipidemia    Kidney abscess     Social History   Tobacco Use   Smoking status: Former    Packs/day: 0.33    Years: 3.00  Total pack years: 0.99    Types: Cigarettes    Quit date: 04/02/2019    Years since quitting: 3.0    Passive exposure: Current   Smokeless tobacco: Never  Vaping Use   Vaping Use: Never used  Substance Use Topics   Alcohol use: No   Drug use: Yes    Frequency: 7.0 times per week    Types: Marijuana    Comment: last was today    Family History  Problem Relation Age of Onset   Asthma Mother    Hypertension Other    Hyperlipidemia Maternal Grandmother    Hypertension Maternal Grandmother    Diabetes Paternal Grandmother     No Known Allergies  Physical Exam: Constitutional: in no apparent distress  Vitals:   04/04/22 1127 04/04/22 1137  BP:    Pulse:    Resp:    Temp: 99.4 F (37.4 C)   SpO2:  95%   EYES: anicteric ENMT: + trach on vent Cardiovascular: tachy rr Respiratory:  respiratory effort on vent GI: soft, nt Musculoskeletal: no edema Skin: no rash  Lab Results  Component Value Date   WBC 22.2 (H) 04/04/2022   HGB 9.7 (L) 04/04/2022   HCT 29.1 (L) 04/04/2022   MCV 85.1 04/04/2022   PLT 336 04/04/2022    Lab Results  Component Value Date   CREATININE 2.04 (H) 04/04/2022   BUN 59 (H) 04/04/2022   NA 133 (L) 04/04/2022   K 3.8 04/04/2022   CL 96 (L) 04/04/2022   CO2 27 04/04/2022    Lab Results  Component Value Date   ALT 160 (H) 04/03/2022   AST 64 (H) 04/03/2022   ALKPHOS 80 04/03/2022     Microbiology: Recent Results (from the past 240 hour(s))  Culture, Respiratory w Gram Stain     Status: None   Collection Time: 03/26/22  8:23 AM   Specimen: Bronchoalveolar Lavage; Respiratory  Result Value Ref Range Status   Specimen Description BRONCHIAL ALVEOLAR LAVAGE  Final   Special Requests NONE  Final   Gram Stain   Final    RARE WBC PRESENT, PREDOMINANTLY PMN NO ORGANISMS SEEN    Culture   Final    NO STAPHYLOCOCCUS AUREUS ISOLATED FEW EIKENELLA CORRODENS Usually susceptible to penicillin and other beta lactam agents,quinolones,macrolides and tetracyclines. No Pseudomonas species isolated Performed at [facility] [address], [place], [place] [address]    Report Status 03/28/2022 FINAL  Final  Culture, blood (Routine X 2) w Reflex to ID Panel     Status: None   Collection Time: 03/27/22  2:08 AM   Specimen: BLOOD  Result Value Ref Range Status   Specimen Description BLOOD RIGHT ANTECUBITAL  Final   Special Requests   Final    BOTTLES DRAWN AEROBIC AND ANAEROBIC Blood Culture results may not be optimal due to an excessive volume of blood received in culture bottles   Culture   Final    NO GROWTH 5 DAYS Performed at [facility], [place] [address] [place], [place] [address]    Report Status 04/01/2022 FINAL  Final  Culture, blood (Routine X 2) w Reflex to ID Panel     Status: None   Collection Time: 03/27/22  2:08  AM   Specimen: BLOOD RIGHT ARM  Result Value Ref Range Status   Specimen Description BLOOD RIGHT ARM  Final   Special Requests   Final    BOTTLES DRAWN AEROBIC AND ANAEROBIC Blood Culture results  may not be optimal due to an excessive volume of blood received in culture bottles   Culture   Final    NO GROWTH 5 DAYS Performed at [facility], [place] [address] [place], [place] [address]    Report Status 04/01/2022 FINAL  Final  Culture, Respiratory w Gram Stain     Status: None   Collection Time: 03/29/22 11:56 AM   Specimen: Tracheal Aspirate; Respiratory  Result Value Ref Range Status   Specimen Description TRACHEAL ASPIRATE  Final   Special Requests NONE  Final   Gram Stain   Final    MODERATE WBC PRESENT, PREDOMINANTLY PMN FEW GRAM POSITIVE COCCI IN PAIRS FEW GRAM NEGATIVE RODS    Culture   Final    Normal respiratory flora-no Staph aureus or Pseudomonas seen Performed at [facility], [address], [place], [place] [address]    Report Status 03/31/2022 FINAL  Final  MRSA Next Gen by PCR, Nasal     Status: None   Collection Time: 03/29/22  4:04 PM   Specimen: Nasal Mucosa; Nasal Swab  Result Value Ref Range Status   MRSA by PCR Next Gen NOT DETECTED NOT DETECTED Final    Comment: (NOTE) The GeneXpert MRSA Assay (FDA approved for NASAL specimens only), is one component of a comprehensive MRSA colonization surveillance program. It is not intended to diagnose MRSA infection nor to guide or monitor treatment for MRSA infections. Test performance is not FDA approved in patients less than 61 years old. Performed at [facility], [place] [address] [place], [place] [address]   Culture, Respiratory w Gram Stain     Status: None   Collection Time: 03/31/22  5:12 PM   Specimen: Tracheal Aspirate; Respiratory  Result Value Ref Range Status   Specimen Description TRACHEAL ASPIRATE  Final   Special Requests NONE  Final   Gram Stain   Final    ABUNDANT WBC PRESENT,  PREDOMINANTLY PMN NO ORGANISMS SEEN    Culture   Final    Normal respiratory flora-no Staph aureus or Pseudomonas seen Performed at [facility], [address] [place], [place] [address]    Report Status 04/02/2022 FINAL  Final    Thayer Headings, [place] for Infectious Disease [facility] [email or web address] 04/04/2022, 12:15 PM

## 2022-04-04 NOTE — Progress Notes (Addendum)
NAME:  Rachael [name], MRN:  [id number], DOB:  06-29-2000, LOS: 38 ADMISSION DATE:  03/19/2022, CONSULTATION DATE:  04/04/22 REFERRING MD:  drawbridge, CHIEF COMPLAINT:  shortness of breath and weakness   History of Present Illness:  Rachael [name] is a 22 y.o. F with history of depression and suicidal ideation who presented to [place] ED with sudden onset of shortness of breath and upper and lower extremity weakness.  History taken from Epic as patient is intubated and no family is at the bedside.  She initially had a headache and back pain prior to onset but denied abdominal pain or chest pain.  Vitals showed an initial temp of 13F, bloodwork with WBC 18k, UDS + for bezos and THC.   Her symptoms progressed to the point she wasn't speaking and ABG showed hypercarbic respiratory failure so pt was intubated.  She had some bradycardia and hypotension post-intubation, so was started on peripheral Levophed and given atropine during transport   Pertinent  Medical History   has a past medical history of Depression, Hyperlipidemia, and Kidney abscess.   Significant Hospital Events: Including procedures, antibiotic start and stop dates in addition to other pertinent events   2/16 presented to DB with all weakness and shortness of breath requiring intubation and transfer to [facility] 2/18 episodes of desaturations and hypercarbia unexplained 2/19 trach, cortrak, PICC 2/23 therapeutic bronch 2/24 Eikenella corrodens BAL. Not triggering vent  2/25 narrow abx from vanc zosyn to rocephin. Will reach out to OSH re transfer and consideration of diaphragmatic pacemaker - unfortunately no beds at [facility] and declined transfer at [facility] 2/26 fevers, abx broadened> vanc/ merrem, diuresing 2/27 chest CT, MRI brain, ongoing fevers, did not tolerate/ like PMV 2/28 trach aspirate resent as 2/26 neg.  Continued fevers.  Episode of vomiting after large bolus feeding.  Remains on 70% 2/29 ongoing fevers, working with SLP  for PMV 3/3 jump in scr, ?bladder outlet obstruction, foley replaced    Interim History / Subjective:   Complaining of headache, I suspect from laying her braided hair   Objective   Blood pressure 118/66, pulse 91, temperature 99.9 F (37.7 C), temperature source Axillary, resp. rate 20, height '5\' 7"'$  (1.702 m), weight 96.5 kg, SpO2 95 %.    Vent Mode: PRVC FiO2 (%):  [40 %] 40 % Set Rate:  [20 bmp] 20 bmp Vt Set:  [490 mL] 490 mL PEEP:  [5 cmH20] 5 cmH20 Plateau Pressure:  [21 cmH20-22 cmH20] 22 cmH20   Intake/Output Summary (Last 24 hours) at 04/04/2022 0931 Last data filed at 04/04/2022 0800 Gross per 24 hour  Intake 2154.65 ml  Output 4110 ml  Net -1955.35 ml   Filed Weights   04/02/22 0339 04/03/22 0500 04/04/22 0428  Weight: 96.5 kg 96.5 kg 96.5 kg   General:  young fm, trach in place on vent  HEENT: NCAT, trach in place  Neuro: no movement of UE LE  CV: RRR, s1 s2  PULM: BL vented breaths GI: soft nt nd  Extremities: dependent edema     Assessment & Plan:   GOC / DNR status -clear GOC -- aggressive care/rehab x46mo If Qol not acceptable at that point, transition to comfort care. Mom and medical team are supportive, and will continue to help facilitate best shot at recovery during this period of time.  PMT following, appreciate assistance   Acute hypoxic and hypercarbic resp failure S/p trach Vent dependent resp failure  Eikenella corrodens HCAP 2/23  Ongoing aspiration  Mucous plugging  Dysphagia s/p PEG  - PCCM [facility] for consideration transfer for diaphragmatic pacemaker and ongoing care, unfortuantely no beds [facility] and declined at [facility].   - I suspect life long vent requirement and will need LTACH placement  P Remains on full vent support  Trach care, pulmonary hygiene CoughAssist device Remains on vent Suspect long-term vent requirement will need LTAC  Acute worsening renal failure Plan: Foley exchanged last night with  1500 cc urine out Suspect bladder outlet obstruction as cause.  Also at risk for drug-induced cause. If renal failure or worsening will need to consult nephrology Check renal ultrasound today  Fevers Leukocytosis, this has been persistent -felt that this is 2/2 PNA? P -cont meropenem - Foley has been exchanged - We may need to consider a PICC line holiday it is only 19 days old however she now developed new onset renal failure. 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 This was completed through the application of advanced monitoring technologies and extensive interpretation of multiple databases. During this encounter critical care time was devoted to patient care services described in this note for 32 minutes.   [facility] 04/04/2022 9:31 AM

## 2022-04-04 NOTE — Progress Notes (Addendum)
eLink Physician-Brief Progress Note Patient Name: Rachael [name] DOB: 2000/09/13 MRN: [id number]   Date of Service  04/04/2022  HPI/Events of Note  Notified of rise in creatinine. Initial BMP reviewed. Ordered a repeat Bmp to confirm. D/w RN. Making 30 cc urine per hour. RN had flushed the foley and noted no issues. Has been having fevers for days but very quick jump in creat noted today. No hypotension  eICU Interventions  Bladder scan now  NS bolus x 1  May need renal eval this AM depending on how she does      Intervention Category Major Interventions: Electrolyte abnormality - evaluation and management  Tredarius Cobern G Manav Pierotti 04/04/2022, 6:26 AM  Addendum at 6:35 am - RN let me know patient has more than 500 cc in bladder scan. Will replace the foley cath. Repeat BMP at 11 am.

## 2022-04-04 NOTE — Progress Notes (Signed)
Pt transported from 2H02 to the patio, then back to 2H02. Pt tolerated well.

## 2022-04-04 NOTE — Progress Notes (Signed)
eLink Physician-Brief Progress Note Patient Name: Rachael [name] DOB: 12/27/2000 MRN: [id number]   Date of Service  04/04/2022  HPI/Events of Note  Notified of hypokalemia K 3.3, crea improving from 2.04 --> 1.11.   eICU Interventions  Replete K - 76mq via PEG and 130m IV.      Intervention Category Minor Interventions: Electrolytes abnormality - evaluation and management  VaElsie Lincoln/04/2022, 9:38 PM

## 2022-04-04 NOTE — Progress Notes (Signed)
Pt asking for [place] to not wake her up at night for tx. Pt irratated at this RT when I explained that my orders were to give her Q4 tx. Told Pt to talk to her Dr today and maybe have the orders changed

## 2022-04-04 NOTE — Progress Notes (Signed)
Greated myself to Pt and person at bedside. Went to trachel sx pt went down trach x1 with very sm return. Pt tells me to stop touching her. Hypertonic saline and CPT refused. Pt told me to get out and not come back. I informed Pt that we had n said she dont care. o other RTs available .Pt said she dont care. RN notified

## 2022-04-05 ENCOUNTER — Inpatient Hospital Stay (HOSPITAL_COMMUNITY): Payer: Medicaid Other

## 2022-04-05 DIAGNOSIS — I6389 Other cerebral infarction: Secondary | ICD-10-CM | POA: Diagnosis not present

## 2022-04-05 DIAGNOSIS — R4182 Altered mental status, unspecified: Secondary | ICD-10-CM | POA: Diagnosis not present

## 2022-04-05 DIAGNOSIS — Z7189 Other specified counseling: Secondary | ICD-10-CM | POA: Diagnosis not present

## 2022-04-05 DIAGNOSIS — N179 Acute kidney failure, unspecified: Secondary | ICD-10-CM | POA: Diagnosis not present

## 2022-04-05 DIAGNOSIS — Z515 Encounter for palliative care: Secondary | ICD-10-CM | POA: Diagnosis not present

## 2022-04-05 DIAGNOSIS — Z66 Do not resuscitate: Secondary | ICD-10-CM | POA: Diagnosis not present

## 2022-04-05 LAB — BLOOD GAS, VENOUS
Acid-Base Excess: 7.8 mmol/L — ABNORMAL HIGH (ref 0.0–2.0)
Bicarbonate: 34.5 mmol/L — ABNORMAL HIGH (ref 20.0–28.0)
Drawn by: 7250
O2 Saturation: 84.6 %
Patient temperature: 37
pCO2, Ven: 61 mmHg — ABNORMAL HIGH (ref 44–60)
pH, Ven: 7.36 (ref 7.25–7.43)
pO2, Ven: 55 mmHg — ABNORMAL HIGH (ref 32–45)

## 2022-04-05 LAB — CBC
HCT: 27.2 % — ABNORMAL LOW (ref 36.0–46.0)
Hemoglobin: 8.6 g/dL — ABNORMAL LOW (ref 12.0–15.0)
MCH: 28.2 pg (ref 26.0–34.0)
MCHC: 31.6 g/dL (ref 30.0–36.0)
MCV: 89.2 fL (ref 80.0–100.0)
Platelets: 337 10*3/uL (ref 150–400)
RBC: 3.05 MIL/uL — ABNORMAL LOW (ref 3.87–5.11)
RDW: 14.6 % (ref 11.5–15.5)
WBC: 16.3 10*3/uL — ABNORMAL HIGH (ref 4.0–10.5)
nRBC: 0 % (ref 0.0–0.2)

## 2022-04-05 LAB — BASIC METABOLIC PANEL
Anion gap: 7 (ref 5–15)
BUN: 28 mg/dL — ABNORMAL HIGH (ref 6–20)
CO2: 28 mmol/L (ref 22–32)
Calcium: 8.8 mg/dL — ABNORMAL LOW (ref 8.9–10.3)
Chloride: 99 mmol/L (ref 98–111)
Creatinine, Ser: 0.88 mg/dL (ref 0.44–1.00)
GFR, Estimated: >60 mL/min (ref >60)
Glucose, Bld: 186 mg/dL — ABNORMAL HIGH (ref 70–99)
Potassium: 3.6 mmol/L (ref 3.5–5.1)
Sodium: 134 mmol/L — ABNORMAL LOW (ref 135–145)

## 2022-04-05 LAB — GLUCOSE, CAPILLARY
Glucose-Capillary: 184 mg/dL — ABNORMAL HIGH (ref 70–99)
Glucose-Capillary: 188 mg/dL — ABNORMAL HIGH (ref 70–99)
Glucose-Capillary: 163 mg/dL — ABNORMAL HIGH (ref 70–99)
Glucose-Capillary: 149 mg/dL — ABNORMAL HIGH (ref 70–99)
Glucose-Capillary: 177 mg/dL — ABNORMAL HIGH (ref 70–99)

## 2022-04-05 MED ORDER — TRAMADOL HCL 50 MG PO TABS
50.0000 mg | ORAL_TABLET | Freq: Four times a day (QID) | ORAL | Status: DC | PRN
  Administered 2022-04-05 – 2022-04-17 (×12): 50 mg
  Filled 2022-04-05 (×12): qty 1

## 2022-04-05 MED ORDER — TRAMADOL 5 MG/ML ORAL SUSPENSION: 50.0000 mg | Freq: Four times a day (QID) | ORAL | Status: DC | PRN

## 2022-04-05 MED ORDER — LORAZEPAM 2 MG/ML IJ SOLN
2.0000 mg | Freq: Once | INTRAMUSCULAR | Status: AC
  Administered 2022-04-05: 2 mg via INTRAVENOUS
  Filled 2022-04-05: qty 1

## 2022-04-05 NOTE — TOC Progression Note (Signed)
Transition of Care [facility]) - Progression Note    Patient Details  Name: Yamiled Picone MRN: [id number] Date of Birth: 04/19/2000  Transition of Care [facility]) CM/SW [place], [place] Phone Number: 04/05/2022, 4:03 PM  Clinical Narrative:     CSW following to fax out referral for Vent/SNF placement to [facility] and [facility] when patient is close to being medically ready for dc. CSW will continue to follow and assist with patients dc planning needs.   Expected Discharge Plan: [place] Barriers to Discharge: Continued Medical Work up  Expected Discharge Plan and Services In-house Referral: Clinical Social Work Discharge Planning Services: CM Consult   Living arrangements for the past 2 months: Single Family Home                                       Social Determinants of Health (SDOH) Interventions SDOH Screenings   Food Insecurity: No Food Insecurity (03/23/2022)  Housing: Low Risk  (03/23/2022)  Transportation Needs: No Transportation Needs (03/23/2022)  Utilities: Not At Risk (03/23/2022)  Tobacco Use: Medium Risk (03/26/2022)    Readmission Risk Interventions     No data to display

## 2022-04-05 NOTE — Progress Notes (Signed)
Daily Progress Note   Patient Name: Rachael [name]       Date: 04/05/2022 DOB: September 25, 2000  Age: 22 y.o. MRN#: [id number] Attending Physician: Kipp Brood, MD Primary Care Physician: Patient, No Pcp Per Admit Date: 03/19/2022 Length of Stay: 17 days  Reason for Consultation/Follow-up: Establishing goals of care  HPI/Patient Profile:  22 y.o. female  with past medical history of depression and suicidal ideation who presented to [place] ED with sudden onset of shortness of breath and upper and lower extremity weakness. ED work-up found loss of speaking ability and hypercarbic respiratory failure and was subsequently intubated and transferred to [facility].  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    PMT was consulted for [place] conversations.  Subjective:   Subjective: Chart Reviewed. Updates received. Patient Assessed. Created space and opportunity for patient  and family to explore thoughts and feelings regarding current medical situation.  Today's Discussion: Today before entering the patient's room I spoke with the nurse outside.  She states she is doing okay, not much change over the weekend.  She did have a Foley replaced due to urinary retention. Fevers broke yesterday.  I entered the room with the nurse and the patient and mother were both sleeping soundly.  I elected to not wake them.  Reviewed vent settings, medications, tube feeding rates with the nurse.  Discussed current plan for continued palliative support while allowing time for neurological improvement.  I reviewed recent notes including SLP notes on FEES testing which showed high risk of aspiration.  Patient and her mother excepted risk in order to allow sips of water and ice.  Also noted SLP evaluation for taking down  a trach to allow vocalization with ventilator expiration.  This seemed to go well and recommended ongoing use for expressing wishes.  PT, OT, SLP have been following frequently.  Continued attempts at rehab and improvement.  At this point it appears patient is on board with continuing attempts at improvement.  Palliative medicine will continue to follow for any change in goals.  I asked the nurse to notify the patient and her mother that I stop by this morning while they were sleeping.  We will follow-up in 1 week.  Review of Systems  Unable to perform ROS: Other  Patient sleeping, did not wake her  Objective:   Vital Signs:  BP (!) 103/52 (BP Location: Right Wrist)   Pulse 75   Temp 98.9 F (37.2 C) (Oral)   Resp 16   Ht '5\' 7"'$  (1.702 m)   Wt 98.1 kg   SpO2 96%   BMI 33.87 kg/m   Physical Exam: Physical Exam Vitals and nursing note reviewed.  Constitutional:      General: She is sleeping. She is not in acute distress.    Appearance: She is ill-appearing.  HENT:     Head: Normocephalic and atraumatic.  Cardiovascular:     Rate and Rhythm: Normal rate.  Pulmonary:     Effort: Pulmonary effort is normal. No respiratory distress.  Skin:    General: Skin is warm and dry.     Palliative Assessment/Data: 10-20% (TF per PEG)    Existing Vynca/ACP Documentation: None  Assessment & Plan:   Impression: Present on Admission:  Altered mental state  SUMMARY OF RECOMMENDATIONS   DNR Continue full scope of care otherwise Goal of  attempting to get some form of improvement Continued time for outcomes Allowing sips and chips with pt acceptance of risk Continued SLP work with vocalization Appreciate multidisciplinary team effort with this patient PMT will continue to follow once a week for ongoing support Please contact PMT for any significant changes or palliative needs otherwise  Symptom Management:  Per primary team PMT will continue to follow  Code Status:  DNR  Prognosis: Unable to determine  Discharge Planning: To Be Determined  Discussed with: Nursing staff, medical team  Thank you for allowing [place] to participate in the care of Bellarae Releford PMT will continue to support holistically.  Billing based on MDM: High  Problems Addressed: One acute or chronic illness or injury that poses a threat to life or bodily function  Amount and/or Complexity of Data: Category 3:Discussion of management or test interpretation with external physician/other qualified health care professional/appropriate source (not separately reported)  Risks: N/A  Walden Field, NP Palliative Medicine Team  Team Phone # [phone] (Nights/Weekends)  09/30/2020, 8:17 AM

## 2022-04-05 NOTE — Progress Notes (Signed)
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   #Fever, unclear etiology #C7 spinal artery infarct -evolving #Trach on vent #Leukocytosis-trending down -Etiology of fever is unclear. Noted that pt is afebrile about  48hrs after stopping vanc. The temporal relationship to stopping vanc and fever resolution could be incidental vs drug fever. Evolving infarct in upper cervical chord noted on 2/27 MR brain which could contribute to fever -CT showed left lung opacification and vloume loss+ ground glass opacity in RUL improved on 23/1 CXR Recommendations: -Continue merrem. If pt remains afebrile we can stop merrem tomorrow.   Microbiology:   Antibiotics: Merrem 2/26- Vancomycin 2/23-3/1 Piptazo 2/23-2/24 Ctx 2/25-2/26 Pip-tazo 2/19-2/*20 Unasyn 2/21-22 Cultures: Blood 2/24 NG Urine 2/19 NG    SUBJECTIVE: On vent via trach, nods and months words in reponce Interval: Afebrile overnight. Wbc 16.3K  Review of Systems: Review of Systems  All other systems reviewed and are negative.    Scheduled Meds:  aspirin  81 mg Per Tube Daily   Chlorhexidine Gluconate Cloth  6 each Topical Daily   clonazepam  0.5 mg Per Tube TID   clopidogrel  75 mg Per Tube Daily   vitamin B-12  1,000 mcg Per Tube Daily   enoxaparin (LOVENOX) injection  40 mg Subcutaneous Daily   famotidine  20 mg Per Tube BID   feeding supplement (PROSource TF20)  60 mL Per Tube TID    fiber supplement (BANATROL TF)  60 mL Per Tube QID   folic acid  1 mg Per Tube Daily   free water  100 mL Per Tube Q12H   multivitamin with minerals  1 tablet Per Tube Daily   mouth rinse  15 mL Mouth Rinse Q2H   sertraline  50 mg Per Tube Daily   sodium chloride flush  10-40 mL Intracatheter Q12H   sodium chloride HYPERTONIC  4 mL Nebulization Q4H   thiamine  100 mg Per Tube Daily   traZODone  100 mg Per Tube QHS   Continuous Infusions:  sodium chloride 10 mL/hr at 04/05/22 1100   feeding supplement (JEVITY 1.2 CAL) 50 mL/hr at 04/05/22 1100   meropenem (MERREM) IV Stopped (04/05/22 0602)   ondansetron (ZOFRAN) IV     promethazine (PHENERGAN) injection (IM or IVPB)     sodium chloride     PRN Meds:.acetaminophen (TYLENOL) oral liquid 160 mg/5 mL, docusate, fentaNYL (SUBLIMAZE) injection, iohexol, ipratropium-albuterol, ondansetron (ZOFRAN) IV, ondansetron (ZOFRAN) IV, mouth rinse, polyethylene glycol, sodium chloride flush, traMADol, zolpidem No Known Allergies  OBJECTIVE: Vitals:   04/05/22 0900 04/05/22 1000 04/05/22 1100 04/05/22 1121  BP: (!) 103/52 (!) 107/52 (!) 107/52   Pulse: 75 91 87   Resp: 16 (!) 8 18   Temp:    98.3 F (36.8 C)  TempSrc:    Oral  SpO2: 96% 99% 93%   Weight:      Height:  Body mass index is 33.87 kg/m.  Physical Exam Constitutional:      Comments: trach  HENT:     Head: Normocephalic and atraumatic.     Right Ear: Tympanic membrane normal.     Left Ear: Tympanic membrane normal.     Nose: Nose normal.     Mouth/Throat:     Mouth: Mucous membranes are moist.  Eyes:     Extraocular Movements: Extraocular movements intact.     Conjunctiva/sclera: Conjunctivae normal.     Pupils: Pupils are equal, round, and reactive to light.  Cardiovascular:     Rate and Rhythm: Normal rate and regular rhythm.     Heart sounds: No murmur heard.    No friction rub. No gallop.  Pulmonary:     Effort: Pulmonary effort is normal.     Breath sounds:  Normal breath sounds.  Abdominal:     General: Abdomen is flat.     Palpations: Abdomen is soft.  Skin:    General: Skin is warm and dry.  Neurological:     General: No focal deficit present.     Mental Status: She is oriented to person, place, and time.  Psychiatric:        Mood and Affect: Mood normal.       Lab Results Lab Results  Component Value Date   WBC 16.3 (H) 04/05/2022   HGB 8.6 (L) 04/05/2022   HCT 27.2 (L) 04/05/2022   MCV 89.2 04/05/2022   PLT 337 04/05/2022    Lab Results  Component Value Date   CREATININE 0.88 04/05/2022   BUN 28 (H) 04/05/2022   NA 134 (L) 04/05/2022   K 3.6 04/05/2022   CL 99 04/05/2022   CO2 28 04/05/2022    Lab Results  Component Value Date   ALT 160 (H) 04/03/2022   AST 64 (H) 04/03/2022   ALKPHOS 80 04/03/2022   BILITOT 0.9 04/03/2022        Laurice Record, [facility] for Infectious Disease [facility] 04/05/2022, 11:33 AM

## 2022-04-05 NOTE — Progress Notes (Signed)
PT Cancellation Note  Patient Details Name: Rachael [name] MRN: [id number] DOB: [birth year]   Cancelled Treatment:    Reason Eval/Treat Not Completed: (P) Patient declined, no reason specified RN reports pt has had a rough day and just refused SLP. When pt asked about therapy today she declines participation right now, maybe later. PT will follow back as time allows.   Haile Bosler B. Migdalia Dk PT, DPT Acute Rehabilitation Services Please use secure chat or  Call Office [phone]    [place] 04/05/2022, 2:08 PM

## 2022-04-05 NOTE — Progress Notes (Incomplete)
Nutrition Follow-up  DOCUMENTATION CODES:   Not applicable  INTERVENTION:   Tube Feeding via    NUTRITION DIAGNOSIS:   Inadequate oral intake related to inability to eat as evidenced by NPO status.  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   PMH includes chronic prescription opioid and benzo use, depression with SI, HLD, kidney abscess. Pt is daily smoker; EtOH abuse, daily drinker-at least half pint   Jevity 1.2 at 50 ml/hr with Pro-SourceTF 20 60 mL daily  Acute worsening of renal function likely related to bladder outlet obstruction; foley exchanged and 1.5 L drained. Creatinine improved to wdl today, BUN down to 28  Labs: Meds:  ***   NUTRITION - FOCUSED PHYSICAL EXAM:  {RD Focused Exam List:21252}  Diet Order:   Diet Order             Diet NPO time specified  Diet effective now                   EDUCATION NEEDS:   Not appropriate for education at this time  Skin:  Skin Assessment: Skin Integrity Issues: Skin Integrity Issues:: Incisions, Other (Comment) Incisions: new PEG tube Other: MASD to buttocks  Last BM:  2/27 type 7 rectal tuber  Height:   Ht Readings from Last 1 Encounters:  03/22/22 '5\' 7"'$  (1.702 m)    Weight:   Wt Readings from Last 1 Encounters:  04/05/22 98.1 kg    Ideal Body Weight:  61.4 kg  BMI:  Body mass index is 33.87 kg/m.  Estimated Nutritional Needs:   Kcal:  1700-1900 kcals  Protein:  120-150 g  Fluid:  >/=  2L   Kerman Passey MS, RDN, LDN, CNSC Registered Dietitian 3 Clinical Nutrition RD Pager and On-Call Pager Number Located in [place]

## 2022-04-05 NOTE — Plan of Care (Signed)
  Problem: Education: Goal: Knowledge of General Education information will improve Description: Including pain rating scale, medication(s)/side effects and non-pharmacologic comfort measures Outcome: Progressing   Problem: Clinical Measurements: Goal: Ability to maintain clinical measurements within normal limits will improve Outcome: Progressing Goal: Respiratory complications will improve Outcome: Progressing   Problem: Nutrition: Goal: Adequate nutrition will be maintained Outcome: Progressing   Problem: Coping: Goal: Level of anxiety will decrease Outcome: Progressing   Problem: Elimination: Goal: Will not experience complications related to bowel motility Outcome: Progressing Goal: Will not experience complications related to urinary retention Outcome: Progressing

## 2022-04-05 NOTE — Progress Notes (Signed)
SLP Cancellation Note  Patient Details Name: Rachael [name] MRN: [id number] DOB: 28-Jan-2001   Cancelled treatment:       Reason Eval/Treat Not Completed: Patient declined - having a bad day. Asked SLP to return later this pm, but schedule won't allow it.  Spoke with RT - recommend continuing trials of letting some but not all of the air out of the cuff and decreasing PEEP to  5. She benefits from brief periods of time to adjust to expiratory air flowing from mouth/nose.  Recommend doing this for several days before we try the inline valve again. D/W RN.\  Arria Naim L. Tivis Ringer, MA CCC/SLP Clinical Specialist - Acute Care SLP Acute Rehabilitation Services Office number [phone]    [name] 04/05/2022, 2:41 PM

## 2022-04-05 NOTE — Progress Notes (Signed)
NAME:  Rachael [name], MRN:  [id number], DOB:  December 20, 2000, LOS: 68 ADMISSION DATE:  03/19/2022, CONSULTATION DATE:  04/05/22 REFERRING MD:  [place], CHIEF COMPLAINT:  shortness of breath and weakness   History of Present Illness:  Rachael [name] is a 22 y.o. F with history of depression and suicidal ideation who presented to [place] ED with sudden onset of shortness of breath and upper and lower extremity weakness.  History taken from Epic as patient is intubated and no family is at the bedside.  She initially had a headache and back pain prior to onset but denied abdominal pain or chest pain.  Vitals showed an initial temp of 31F, bloodwork with WBC 18k, UDS + for bezos and THC.   Her symptoms progressed to the point she wasn't speaking and ABG showed hypercarbic respiratory failure so pt was intubated.  She had some bradycardia and hypotension post-intubation, so was started on peripheral Levophed and given atropine during transport   Pertinent  Medical History   has a past medical history of Depression, Hyperlipidemia, and Kidney abscess.   Significant Hospital Events: Including procedures, antibiotic start and stop dates in addition to other pertinent events   2/16 presented to DB with all weakness and shortness of breath requiring intubation and transfer to [facility] 2/18 episodes of desaturations and hypercarbia unexplained 2/19 trach, cortrak, PICC 2/23 therapeutic bronch 2/24 Eikenella corrodens BAL. Not triggering vent  2/25 narrow abx from vanc zosyn to rocephin. Will reach out to OSH re transfer and consideration of diaphragmatic pacemaker - unfortunately no beds at [facility] and declined transfer at [facility] 2/26 fevers, abx broadened> vanc/ merrem, diuresing 2/27 chest CT, MRI brain, ongoing fevers, did not tolerate/ like PMV 2/28 trach aspirate resent as 2/26 neg.  Continued fevers.  Episode of vomiting after large bolus feeding.  Remains on 70% 2/29 ongoing fevers, working with SLP  for [place] 3/3 jump in scr, ?bladder outlet obstruction, foley replaced    Interim History / Subjective:  No new complaints.  Fever has resolved.   Objective   Blood pressure (!) 107/51, pulse 80, temperature 98.9 F (37.2 C), temperature source Oral, resp. rate 20, height '5\' 7"'$  (1.702 m), weight 98.1 kg, SpO2 95 %.    Vent Mode: PRVC FiO2 (%):  [40 %] 40 % Set Rate:  [20 bmp] 20 bmp Vt Set:  [490 mL] 490 mL PEEP:  [5 cmH20] 5 cmH20 Plateau Pressure:  [17 cmH20-20 cmH20] 18 cmH20   Intake/Output Summary (Last 24 hours) at 04/05/2022 0905 Last data filed at 04/05/2022 0800 Gross per 24 hour  Intake 3371.6 ml  Output 10240 ml  Net -6868.4 ml    Filed Weights   04/03/22 0500 04/04/22 0428 04/05/22 0500  Weight: 96.5 kg 96.5 kg 98.1 kg   General:  young fm, trach in place on vent  HEENT: NCAT, trach in place  Neuro: no movement of UE LE  CV: RRR, s1 s2  PULM: equal air entry. No spontaneous triggering but patient set on 20/min with a minute ventilation of 9+L/min GI: soft nt nd  Extremities: dependent edema     Ancillary tests personally reviewed  VBG on RR of 14 with MV 6.6 l/min show pCO2 61.  Creatinine 0.88,  Assessment & Plan:   GOC / DNR status -clear GOC -- aggressive care/rehab x24mo If Qol not acceptable at that point, transition to comfort care. Mom and medical team are supportive, and will continue to help facilitate best shot at recovery during this  period of time.  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  Eikenella corrodens HCAP 2/23  Ongoing aspiration  Mucous plugging Dysphagia s/p PEG  Acute worsening renal failure Neurogenic bladder Situational depression Anxiety Disrupted sleep cycle Hx tobacco use Anterior spinal artery infarct, to level of C7  Flaccid paralysis due to above  Vertical nystagmus  Plan: - PCCM attempted calls to [facility] and  [facility] 2/25 for consideration transfer for diaphragmatic pacemaker and ongoing care, unfortuantely no beds [facility] and declined at [facility].   - I suspect life long vent requirement and will need LTACH placement as patient is not able to respond to hypercarbic stimulus to trigger breath even with Flow Trigger set at 0. - Cough Assist device.  - Periodic I/O catheterization to manage neurogenic bladder - Complete course of Meropenem per ID - Continue current anti-anxiety regimen.   CRITICAL CARE Performed by: Kipp Brood   Total critical care time: 40 minutes  Critical care time was exclusive of separately billable procedures and treating other patients.  Critical care was necessary to treat or prevent imminent or life-threatening deterioration.  Critical care was time spent personally by me on the following activities: development of treatment plan with patient and/or surrogate as well as nursing, discussions with consultants, evaluation of patient's response to treatment, examination of patient, obtaining history from patient or surrogate, ordering and performing treatments and interventions, ordering and review of laboratory studies, ordering and review of radiographic studies, pulse oximetry, re-evaluation of patient's condition and participation in multidisciplinary rounds.  Kipp Brood, MD [facility] ICU Physician [place]  Pager: [phone] Mobile: [phone] After hours: [phone].

## 2022-04-06 ENCOUNTER — Other Ambulatory Visit: Payer: Self-pay

## 2022-04-06 DIAGNOSIS — R4182 Altered mental status, unspecified: Secondary | ICD-10-CM | POA: Diagnosis not present

## 2022-04-06 DIAGNOSIS — N179 Acute kidney failure, unspecified: Secondary | ICD-10-CM | POA: Diagnosis not present

## 2022-04-06 LAB — COMPREHENSIVE METABOLIC PANEL
ALT: 102 U/L — ABNORMAL HIGH (ref 0–44)
AST: 29 U/L (ref 15–41)
Albumin: 2 g/dL — ABNORMAL LOW (ref 3.5–5.0)
Alkaline Phosphatase: 60 U/L (ref 38–126)
Anion gap: 8 (ref 5–15)
BUN: 21 mg/dL — ABNORMAL HIGH (ref 6–20)
CO2: 27 mmol/L (ref 22–32)
Calcium: 8.8 mg/dL — ABNORMAL LOW (ref 8.9–10.3)
Chloride: 99 mmol/L (ref 98–111)
Creatinine, Ser: 0.75 mg/dL (ref 0.44–1.00)
GFR, Estimated: >60 mL/min (ref >60)
Glucose, Bld: 155 mg/dL — ABNORMAL HIGH (ref 70–99)
Potassium: 4.2 mmol/L (ref 3.5–5.1)
Sodium: 134 mmol/L — ABNORMAL LOW (ref 135–145)
Total Bilirubin: 0.3 mg/dL (ref 0.3–1.2)
Total Protein: 5.8 g/dL — ABNORMAL LOW (ref 6.5–8.1)

## 2022-04-06 LAB — GLUCOSE, CAPILLARY
Glucose-Capillary: 132 mg/dL — ABNORMAL HIGH (ref 70–99)
Glucose-Capillary: 157 mg/dL — ABNORMAL HIGH (ref 70–99)
Glucose-Capillary: 160 mg/dL — ABNORMAL HIGH (ref 70–99)
Glucose-Capillary: 161 mg/dL — ABNORMAL HIGH (ref 70–99)
Glucose-Capillary: 170 mg/dL — ABNORMAL HIGH (ref 70–99)
Glucose-Capillary: 178 mg/dL — ABNORMAL HIGH (ref 70–99)

## 2022-04-06 LAB — MAGNESIUM: Magnesium: 1.5 mg/dL — ABNORMAL LOW (ref 1.7–2.4)

## 2022-04-06 MED ORDER — MELATONIN 3 MG PO TABS
3.0000 mg | ORAL_TABLET | Freq: Every day | ORAL | Status: DC
  Administered 2022-04-06 – 2022-04-17 (×12): 3 mg
  Filled 2022-04-06 (×12): qty 1

## 2022-04-06 MED ORDER — INFLUENZA VAC SPLIT QUAD 0.5 ML IM SUSY: 0.5000 mL | PREFILLED_SYRINGE | INTRAMUSCULAR | Status: DC | PRN

## 2022-04-06 MED ORDER — IPRATROPIUM-ALBUTEROL 0.5-2.5 (3) MG/3ML IN SOLN
3.0000 mL | RESPIRATORY_TRACT | Status: DC | PRN
  Administered 2022-04-06 – 2022-04-13 (×15): 3 mL via RESPIRATORY_TRACT
  Filled 2022-04-06 (×15): qty 3

## 2022-04-06 MED ORDER — GERHARDT'S BUTT CREAM
TOPICAL_CREAM | CUTANEOUS | Status: DC | PRN
  Administered 2022-04-06: 1 via TOPICAL
  Filled 2022-04-06 (×3): qty 1

## 2022-04-06 MED ORDER — INSULIN ASPART 100 UNIT/ML IJ SOLN
2.0000 [IU] | INTRAMUSCULAR | Status: DC
  Administered 2022-04-06 (×3): 4 [IU] via SUBCUTANEOUS
  Administered 2022-04-07 (×5): 2 [IU] via SUBCUTANEOUS
  Administered 2022-04-08 (×2): 4 [IU] via SUBCUTANEOUS
  Administered 2022-04-08: 2 [IU] via SUBCUTANEOUS
  Administered 2022-04-08: 4 [IU] via SUBCUTANEOUS
  Administered 2022-04-08 (×2): 2 [IU] via SUBCUTANEOUS
  Administered 2022-04-08: 4 [IU] via SUBCUTANEOUS
  Administered 2022-04-09 (×3): 2 [IU] via SUBCUTANEOUS
  Administered 2022-04-09 (×2): 4 [IU] via SUBCUTANEOUS
  Administered 2022-04-10 (×3): 2 [IU] via SUBCUTANEOUS
  Administered 2022-04-10 – 2022-04-11 (×2): 4 [IU] via SUBCUTANEOUS
  Administered 2022-04-11: 2 [IU] via SUBCUTANEOUS
  Administered 2022-04-11 (×2): 4 [IU] via SUBCUTANEOUS
  Administered 2022-04-11 (×2): 2 [IU] via SUBCUTANEOUS
  Administered 2022-04-11: 4 [IU] via SUBCUTANEOUS
  Administered 2022-04-12: 6 [IU] via SUBCUTANEOUS
  Administered 2022-04-12: 2 [IU] via SUBCUTANEOUS
  Administered 2022-04-12: 6 [IU] via SUBCUTANEOUS
  Administered 2022-04-12 (×2): 4 [IU] via SUBCUTANEOUS
  Administered 2022-04-13: 2 [IU] via SUBCUTANEOUS
  Administered 2022-04-13 (×2): 6 [IU] via SUBCUTANEOUS
  Administered 2022-04-13: 4 [IU] via SUBCUTANEOUS
  Administered 2022-04-13 – 2022-04-14 (×2): 6 [IU] via SUBCUTANEOUS
  Administered 2022-04-14: 4 [IU] via SUBCUTANEOUS
  Administered 2022-04-14: 6 [IU] via SUBCUTANEOUS
  Administered 2022-04-14: 2 [IU] via SUBCUTANEOUS
  Administered 2022-04-14: 6 [IU] via SUBCUTANEOUS
  Administered 2022-04-14 – 2022-04-15 (×5): 4 [IU] via SUBCUTANEOUS

## 2022-04-06 MED ORDER — HYDROCERIN EX CREA
TOPICAL_CREAM | Freq: Two times a day (BID) | CUTANEOUS | Status: DC
  Filled 2022-04-06: qty 113

## 2022-04-06 MED ORDER — CYCLOBENZAPRINE HCL 10 MG PO TABS
5.0000 mg | ORAL_TABLET | Freq: Three times a day (TID) | ORAL | Status: DC | PRN
  Administered 2022-04-06 – 2022-04-17 (×13): 5 mg
  Filled 2022-04-06 (×13): qty 1

## 2022-04-06 MED ORDER — ROSUVASTATIN CALCIUM 20 MG PO TABS
20.0000 mg | ORAL_TABLET | Freq: Every day | ORAL | Status: DC
  Administered 2022-04-06 – 2022-04-14 (×9): 20 mg
  Filled 2022-04-06 (×11): qty 1

## 2022-04-06 MED ORDER — MAGNESIUM SULFATE 4 GM/100ML IV SOLN
4.0000 g | Freq: Once | INTRAVENOUS | Status: AC
  Administered 2022-04-06: 4 g via INTRAVENOUS
  Filled 2022-04-06: qty 100

## 2022-04-06 MED ORDER — MIDODRINE HCL 5 MG PO TABS
2.5000 mg | ORAL_TABLET | Freq: Three times a day (TID) | ORAL | Status: DC
  Administered 2022-04-06 – 2022-04-09 (×11): 2.5 mg
  Filled 2022-04-06 (×12): qty 1

## 2022-04-06 MED ORDER — PNEUMOCOCCAL 20-VAL CONJ VACC 0.5 ML IM SUSY: 0.5000 mL | PREFILLED_SYRINGE | INTRAMUSCULAR | Status: DC | PRN

## 2022-04-06 NOTE — Progress Notes (Addendum)
Pt switched to bed that doesn't do CPT.  CPT machine brought to pt's room to do  CPT via vest. Pt stated she doesn't want to do that right now. Machine in pt's room.

## 2022-04-06 NOTE — Progress Notes (Signed)
Occupational Therapy Treatment Patient Details Name: Rachael [name] MRN: [id number] DOB: 04/08/2000 Today's Date: 04/06/2022   History of present illness Kaari Novick is a 22 y.o. female admitted 03/19/22 with acute arms and leg weakness, dyspnea and hypercabia.  (+) for polysubstances. Pt was intubated on 2/16. 2/19 trach, cortrak, PICC.  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 Today Pt was open to PROM for all 4 extremities at each joint. Attempted to elicit reflexive response - no muscle movement but Pt can feel the attempts. OT will continue to monitor for need of resting hand splints at night. At this time not necessary. Order placed for [facility] boots to prevent heel sores from bed and shortening of the calf posterior leg muscles resulting in bed level foot drop. Total A +2 for rolling at bed level for peri care - when performing flexi seal came out (RN In room). Pt requiring multiple suctioning in trach and in her mouth during rolling and after repositioning on her back. Nystagmus present with head turns, rolling. Initially it was better upon entry of room. Pt is excellent communicator with mouthing words. Due to flexi seal coming out and desire of patient to have it replaced OT did not work on increasing elevation of head above 30 degrees today. OT will continue acute care. LTAC remains appropriate post-acute placement at this time.    Recommendations for follow up therapy are one component of a multi-disciplinary discharge planning process, led by the attending physician.  Recommendations may be updated based on patient status, additional functional criteria and insurance authorization.    Follow Up Recommendations  OT at Long-term acute care hospital     Assistance Recommended at Discharge  Frequent or constant Supervision/Assistance  Patient can return home with the following  Two people to help with walking and/or transfers;Two people to help with bathing/dressing/bathroom;Assistance with cooking/housework;Assistance with feeding;Direct supervision/assist for medications management;Direct supervision/assist for financial management;Assist for transportation;Help with stairs or ramp for entrance   Equipment Recommendations  Other (comment) (defer)    Recommendations for Other Services PT consult;Speech consult;Other (comment) (Palliative Medicine)    Precautions / Restrictions Precautions Precaution Comments: Trach, vent, cortrak, quadriplegia, flexiseal Restrictions Weight Bearing Restrictions: No       Mobility Bed Mobility Overal bed mobility: Needs Assistance Bed Mobility: Rolling Rolling: Total assist, +2 for physical assistance, +2 for safety/equipment         General bed mobility comments: totalAx2 for rolling for peri care and linen change    Transfers                   General transfer comment: did not attempt to elevate head anymore due to flexiseal coming out     Balance                                           ADL either performed or assessed with clinical judgement   ADL                                         General ADL Comments: total A for all aspects of  ADL    Extremity/Trunk Assessment Upper Extremity Assessment Upper Extremity Assessment: RUE deficits/detail;LUE deficits/detail RUE Deficits / Details: attempted to elicit reflexive response today- not successful; Pt states that it feels like BUE and BLE are "asleep" - tingling (not painful though) RUE Sensation: WNL RUE Coordination: decreased fine motor;decreased gross motor LUE Deficits / Details: attempted to elicit reflexive response today- not successful; Pt states that it feels like BUE and BLE are "asleep" - tingling (not painful  though) LUE Sensation: WNL LUE Coordination: decreased fine motor;decreased gross motor            Vision   Vision Assessment?: Vision impaired- to be further tested in functional context Additional Comments: nystagmus present, and worses with head turn, positional changes   Perception     Praxis      Cognition Arousal/Alertness: Awake/alert Behavior During Therapy: WFL for tasks assessed/performed, Flat affect Overall Cognitive Status: Within Functional Limits for tasks assessed (communicated through mouthing)                                 General Comments: responding appropriately to all questions with mouthing words, able to voice needs        Exercises Exercises: Low Level/ICU General Exercises - Upper Extremity Shoulder Flexion: PROM, Both, 10 reps, Supine Shoulder ABduction: PROM, Both, 10 reps, Supine Elbow Flexion: PROM, Both, 10 reps, Supine Elbow Extension: PROM, Both, 10 reps, Supine Wrist Flexion: PROM, Both, 10 reps, Supine Wrist Extension: PROM, Both, 10 reps, Supine General Exercises - Lower Extremity Ankle Circles/Pumps: PROM, Both, 10 reps, Supine Short Arc Quad: PROM, Both, 10 reps, Supine Hip ABduction/ADduction: PROM, Both, 10 reps, Supine Hip Flexion/Marching: PROM, Both, 10 reps, Supine Low Level/ICU Exercises Ankle Circles/Pumps: PROM, Both, 10 reps, Supine Hip ABduction/ADduction: PROM, Both, 10 reps, Supine Heel Slides: PROM, Both, 10 reps, Supine Shoulder Flexion: PROM, Right, Left, 10 reps, Supine Elbow Flexion: PROM, Right, Left, 10 reps, Supine Other Exercises Other Exercises: Gentle cervical rotation to L&R    Shoulder Instructions       General Comments      Pertinent Vitals/ Pain       Pain Assessment Pain Assessment: 0-10 Pain Score: 8  Pain Location: headache/migrane Pain Descriptors / Indicators: Headache Pain Intervention(s): Monitored during session, Repositioned  Home Living                                           Prior Functioning/Environment              Frequency  Min 2X/week        Progress Toward Goals  OT Goals(current goals can now be found in the care plan section)  Progress towards OT goals: Not progressing toward goals - comment (limited by flexiseal coming out, migrane limiting)  Acute Rehab OT Goals Patient Stated Goal: get flexiseal back in OT Goal Formulation: With patient Time For Goal Achievement: 04/20/22 Potential to Achieve Goals: Fair ADL Goals Additional ADL Goal #1: Pt will tolerate upright position for 5 min with max A +2 assist (or use of bed) in preparation for participation in ADL Additional ADL Goal #2: Pt will direct self-care activities using eye gaze and task cards with 100% accuracy Additional ADL Goal #3: Pt will roll at bed level for peri care and bathing with total A +2  Plan Discharge plan remains appropriate    Co-evaluation                 AM-PAC OT "6 Clicks" Daily Activity     Outcome Measure   Help from another person eating meals?: Total Help from another person taking care of personal grooming?: Total Help from another person toileting, which includes using toliet, bedpan, or urinal?: Total Help from another person bathing (including washing, rinsing, drying)?: Total Help from another person to put on and taking off regular upper body clothing?: Total Help from another person to put on and taking off regular lower body clothing?: Total 6 Click Score: 6    End of Session    OT Visit Diagnosis: Other abnormalities of gait and mobility (R26.89);Muscle weakness (generalized) (M62.81);Other symptoms and signs involving the nervous system (R29.898)   Activity Tolerance Patient tolerated treatment well   Patient Left in bed;with call bell/phone within reach;with family/visitor present;with nursing/sitter in room   Nurse Communication Precautions        Time: 1126-1204 OT Time Calculation  (min): 38 min  Charges: OT General Charges $OT Visit: 1 Visit OT Treatments $Self Care/Home Management : 8-22 mins $Therapeutic Activity: 8-22 mins  Jesse Sans OTR/L Acute Rehabilitation Services Office: [place] 04/06/2022, 1:57 PM

## 2022-04-06 NOTE — Progress Notes (Signed)
Orthopedic Tech Progress Note Patient Details:  Rachael [name] [birth year] [id number]  Ortho Devices Type of Ortho Device: Prafo boot/shoe Ortho Device/Splint Location: BLE Ortho Device/Splint Interventions: Ordered, Application, Adjustment   Post Interventions Patient Tolerated: Well Instructions Provided: Adjustment of device, Care of device  Floyce Bujak OTR/L 04/06/2022, 12:58 PM

## 2022-04-06 NOTE — Progress Notes (Signed)
12pm CPT held due to PT & SLP.

## 2022-04-06 NOTE — Progress Notes (Signed)
NAME:  Rachael [name], MRN:  [id number], DOB:  [birth year], LOS: 30 ADMISSION DATE:  03/19/2022, CONSULTATION DATE:  04/06/22 REFERRING MD:  drawbridge, CHIEF COMPLAINT:  shortness of breath and weakness   History of Present Illness:  Rachael [name] is a 22 y.o. F with history of depression and suicidal ideation who presented to [place] ED with sudden onset of shortness of breath and upper and lower extremity weakness.  History taken from Epic as patient is intubated and no family is at the bedside.  She initially had a headache and back pain prior to onset but denied abdominal pain or chest pain.  Vitals showed an initial temp of 42F, bloodwork with WBC 18k, UDS + for bezos and THC.   Her symptoms progressed to the point she wasn't speaking and ABG showed hypercarbic respiratory failure so pt was intubated.  She had some bradycardia and hypotension post-intubation, so was started on peripheral Levophed and given atropine during transport   Pertinent  Medical History   has a past medical history of Depression, Hyperlipidemia, and Kidney abscess.   Significant Hospital Events: Including procedures, antibiotic start and stop dates in addition to other pertinent events   2/16 presented to [facility] with all weakness and shortness of breath requiring intubation and transfer to [facility] 2/18 episodes of desaturations and hypercarbia unexplained 2/19 trach, cortrak, PICC 2/23 therapeutic bronch 2/24 Eikenella corrodens BAL. Not triggering vent  2/25 narrow abx from vanc zosyn to rocephin. Will reach out to OSH re transfer and consideration of diaphragmatic pacemaker - unfortunately no beds at [facility] and declined transfer at [facility] 2/26 fevers, abx broadened> vanc/ merrem, diuresing 2/27 chest CT, MRI brain, ongoing fevers, did not tolerate/ like PMV 2/28 trach aspirate resent as 2/26 neg.  Continued fevers.  Episode of vomiting after large bolus feeding.  Remains on 70% 2/29 ongoing fevers, working with SLP  for PMV 3/3 jump in scr, ?bladder outlet obstruction, foley replaced  3/4 creatinine has improved. When respiratory rate dropped, patient not able to initiate breath on her own.    Interim History / Subjective:  No new complaints.  Fever has resolved.   Objective   Blood pressure (!) 116/58, pulse 89, temperature 99 F (37.2 C), temperature source Oral, resp. rate (!) 22, height '5\' 7"'$  (1.702 m), weight 99 kg, SpO2 95 %.    Vent Mode: PRVC FiO2 (%):  [40 %-50 %] 40 % Set Rate:  [18 bmp-22 bmp] 22 bmp Vt Set:  [490 mL] 490 mL PEEP:  [5 cmH20] 5 cmH20 Pressure Support:  [10 cmH20] 10 cmH20 Plateau Pressure:  [17 cmH20-20 cmH20] 18 cmH20   Intake/Output Summary (Last 24 hours) at 04/06/2022 0825 Last data filed at 04/06/2022 0600 Gross per 24 hour  Intake 2265.21 ml  Output 3645 ml  Net -1379.79 ml    Filed Weights   04/04/22 0428 04/05/22 0500 04/06/22 0437  Weight: 96.5 kg 98.1 kg 99 kg   General:  young fm, trach in place on vent  HEENT: NCAT, trach in place  Neuro: no movement of UE LE, communicating by mouthing words.  CV: RRR, s1 s2  PULM: equal air entry. No spontaneous triggering but patient set on 20/min with a minute ventilation of 9+L/min GI: soft nt nd  Extremities: dependent edema     Ancillary tests personally reviewed  VBG on RR of 14 with MV 6.6 l/min show pCO2 61.  Creatinine 0.75,  Assessment & Plan:   GOC / DNR status -clear GOC --  aggressive care/rehab x40mo If Qol not acceptable at that point, transition to comfort care. Mom and medical team are supportive, and will continue to help facilitate best shot at recovery during this period of time.  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  Will not attempt weaning.  - Cough Assist device.  - Periodic I/O catheterization to manage neurogenic bladder would be better than Foley but patient is resistent - Complete course of Meropenem per ID. 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  - Continue current anti-anxiety regimen.  - Add low dose midodrine to help with postural hypotension.   RKipp Brood MD [facility] Surgery[facility] Physician [place] Pager: [phone]: [phone] hours: [phone].

## 2022-04-06 NOTE — Progress Notes (Signed)
[place] for Infectious Disease  Date of Admission:  03/19/2022     Principal Problem:   Altered mental state Active Problems:   Paralysis ([place])   Chronic respiratory failure with hypoxia and hypercapnia (HCC)   Cerebrovascular accident (CVA) ([place])   Hypothermia   Community acquired pneumonia   Infarction of spinal cord involving anterior spinal artery ([place])   Acute respiratory failure with hypoxia (HCC)   Fever          Assessment: 22 YF admitted with C7 infarct and resultant flaccid paralysis Sp trach and vent, ID engaged for persistent fevers.   #Fever, unclear etiology #C7 spinal artery infarct -evolving #Trach on vent #Leukocytosis-trending down -Etiology of fever is unclear. Noted that pt is afebrile about  48hrs after stopping vanc. The temporal relationship to stopping vanc and fever resolution could be incidental vs drug fever. Evolving infarct in upper cervical chord noted on 2/27 MR brain which could contribute to fever -CT showed left lung opacification and vloume loss+ ground glass opacity in RUL improved on 23/1 CXR Recommendations: -D/C merrem, pt afebrile x 48h   ID will sign off Microbiology:   Antibiotics: Merrem 2/26- Vancomycin 2/23-3/1 Piptazo 2/23-2/24 Ctx 2/25-2/26 Pip-tazo 2/19-2/*20 Unasyn 2/21-22 Cultures: Blood 2/24 NG Urine 2/19 NG    SUBJECTIVE: On vent via trach. Interval: Afebrile overnight.   Review of Systems: Review of Systems  All other systems reviewed and are negative.    Scheduled Meds:  aspirin  81 mg Per Tube Daily   Chlorhexidine Gluconate Cloth  6 each Topical Daily   clonazepam  0.5 mg Per Tube TID   clopidogrel  75 mg Per Tube Daily   vitamin B-12  1,000 mcg Per Tube Daily   enoxaparin (LOVENOX) injection  40 mg Subcutaneous Daily   famotidine  20 mg Per Tube BID   feeding supplement (PROSource TF20)  60 mL Per Tube TID   fiber supplement (BANATROL TF)  60 mL Per Tube QID   folic acid  1  mg Per Tube Daily   free water  100 mL Per Tube Q12H   insulin aspart  2-6 Units Subcutaneous Q4H   melatonin  3 mg Per Tube QHS   midodrine  2.5 mg Per Tube TID WC   multivitamin with minerals  1 tablet Per Tube Daily   mouth rinse  15 mL Mouth Rinse Q2H   rosuvastatin  20 mg Per Tube Daily   sertraline  50 mg Per Tube Daily   sodium chloride flush  10-40 mL Intracatheter Q12H   sodium chloride HYPERTONIC  4 mL Nebulization Q4H   thiamine  100 mg Per Tube Daily   traZODone  100 mg Per Tube QHS   Continuous Infusions:  sodium chloride 250 mL (04/06/22 1407)   feeding supplement (JEVITY 1.2 CAL) 50 mL/hr at 04/06/22 1400   ondansetron (ZOFRAN) IV     promethazine (PHENERGAN) injection (IM or IVPB)     sodium chloride     PRN Meds:.acetaminophen (TYLENOL) oral liquid 160 mg/5 mL, cyclobenzaprine, docusate, fentaNYL (SUBLIMAZE) injection, influenza vac split quadrivalent PF, iohexol, ipratropium-albuterol, ondansetron (ZOFRAN) IV, ondansetron (ZOFRAN) IV, mouth rinse, pneumococcal 20-valent conjugate vaccine, polyethylene glycol, sodium chloride flush, traMADol, zolpidem No Known Allergies  OBJECTIVE: Vitals:   04/06/22 1200 04/06/22 1300 04/06/22 1400 04/06/22 1402  BP: (!) 114/55 (!) 114/56 (!) 114/55 (!) 114/55  Pulse: (!) 105 92 100 94  Resp: 18 (!) 22 (!) 22 (!)  22  Temp:      TempSrc:      SpO2: 97% 99% 99%   Weight:      Height:       Body mass index is 34.18 kg/m.  Physical Exam Constitutional:      Comments: trach  HENT:     Head: Normocephalic and atraumatic.     Right Ear: Tympanic membrane normal.     Left Ear: Tympanic membrane normal.     Nose: Nose normal.     Mouth/Throat:     Mouth: Mucous membranes are moist.  Eyes:     Extraocular Movements: Extraocular movements intact.     Conjunctiva/sclera: Conjunctivae normal.     Pupils: Pupils are equal, round, and reactive to light.  Cardiovascular:     Rate and Rhythm: Normal rate and regular rhythm.      Heart sounds: No murmur heard.    No friction rub. No gallop.  Pulmonary:     Effort: Pulmonary effort is normal.     Breath sounds: Normal breath sounds.  Abdominal:     General: Abdomen is flat.     Palpations: Abdomen is soft.  Skin:    General: Skin is warm and dry.  Neurological:     General: No focal deficit present.     Mental Status: She is oriented to person, place, and time.  Psychiatric:        Mood and Affect: Mood normal.       Lab Results Lab Results  Component Value Date   WBC 16.3 (H) 04/05/2022   HGB 8.6 (L) 04/05/2022   HCT 27.2 (L) 04/05/2022   MCV 89.2 04/05/2022   PLT 337 04/05/2022    Lab Results  Component Value Date   CREATININE 0.75 04/06/2022   BUN 21 (H) 04/06/2022   NA 134 (L) 04/06/2022   K 4.2 04/06/2022   CL 99 04/06/2022   CO2 27 04/06/2022    Lab Results  Component Value Date   ALT 102 (H) 04/06/2022   AST 29 04/06/2022   ALKPHOS 60 04/06/2022   BILITOT 0.3 04/06/2022        Laurice Record, MD [place] [facility] 04/06/2022, 2:25 PM

## 2022-04-06 NOTE — Progress Notes (Signed)
Speech Language Pathology Treatment: Rachael [name] Speaking valve  Patient Details Name: Rachael [name] MRN: [id number] DOB: 11-04-2000 Today's Date: 04/06/2022 Time: [id number] SLP Time Calculation (min) (ACUTE ONLY): 10 min  Assessment / Plan / Recommendation Clinical Impression  Brief session today with Rachael [name], who mouths that she is "having a bad day" and is reluctant to participate. She allowed partial removal of air from cuff to allow upper airway expiration.  PEEP was lowered per RT.  She was encouraged to attend to the vent giving her a breath and then purse lips and focus on oral exhalation, adding a long vowel sound. She achieved limited voicing.  She asserted that she is not ready to try PMV again - provided encouragement. Cuff reinflated and PEEP set back at 5. SLP will follow. D/W RN.   HPI HPI: Rachael [name] is a 22 y.o. female with PMH significant for depression, HLD, Kidney abscess who presents with acute arms and leg weakness, dyspnea and hypercarbia.  Pt was intubated on 2/16; trach 2/19; PEG 2/23. FEES 3/1: sips of water with f/u dry swallows to reduce pooling. High risk of aspiration. 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      SLP Plan   Continue working towards toleration of cuff deflation and PMV use      Recommendations for follow up therapy are one component of a multi-disciplinary discharge planning process, led by the attending physician.  Recommendations may be updated based on patient status, additional functional criteria and insurance authorization.    Recommendations   Sips of water with 4-5 f/u dry swallows                         Rachael [name] L. Rachael Ringer, MA CCC/SLP Clinical Specialist - Acute Care SLP Acute Rehabilitation Services Office number [phone]    Rachael [name]  04/06/2022, 2:10 PM

## 2022-04-06 NOTE — Progress Notes (Signed)
[facility] ADULT ICU REPLACEMENT PROTOCOL   The patient does apply for the [facility] Adult ICU Electrolyte Replacment Protocol based on the criteria listed below:   1.Exclusion criteria: TCTS, ECMO, Dialysis, and Myasthenia Gravis patients 2. Is GFR >/= 30 ml/min? Yes.    Patient's GFR today is >60 3. Is SCr </= 2? Yes.   Patient's SCr is 0.75 mg/dL 4. Did SCr increase >/= 0.5 in 24 hours? No. 5.Pt's weight >40kg  Yes.   6. Abnormal electrolyte(s): mag 1.5  7. Electrolytes replaced per protocol 8.  Call MD STAT for K+ </= 2.5, Phos </= 1, or Mag </= 1 Physician:    Ronda Fairly A 04/06/2022 6:07 AM

## 2022-04-07 DIAGNOSIS — N179 Acute kidney failure, unspecified: Secondary | ICD-10-CM | POA: Diagnosis not present

## 2022-04-07 LAB — BASIC METABOLIC PANEL
Anion gap: 7 (ref 5–15)
BUN: 21 mg/dL — ABNORMAL HIGH (ref 6–20)
CO2: 31 mmol/L (ref 22–32)
Calcium: 8.7 mg/dL — ABNORMAL LOW (ref 8.9–10.3)
Chloride: 97 mmol/L — ABNORMAL LOW (ref 98–111)
Creatinine, Ser: 0.7 mg/dL (ref 0.44–1.00)
GFR, Estimated: >60 mL/min (ref >60)
Glucose, Bld: 124 mg/dL — ABNORMAL HIGH (ref 70–99)
Potassium: 3.7 mmol/L (ref 3.5–5.1)
Sodium: 135 mmol/L (ref 135–145)

## 2022-04-07 LAB — GLUCOSE, CAPILLARY
Glucose-Capillary: 133 mg/dL — ABNORMAL HIGH (ref 70–99)
Glucose-Capillary: 129 mg/dL — ABNORMAL HIGH (ref 70–99)
Glucose-Capillary: 128 mg/dL — ABNORMAL HIGH (ref 70–99)
Glucose-Capillary: 127 mg/dL — ABNORMAL HIGH (ref 70–99)
Glucose-Capillary: 141 mg/dL — ABNORMAL HIGH (ref 70–99)

## 2022-04-07 LAB — MAGNESIUM: Magnesium: 1.7 mg/dL (ref 1.7–2.4)

## 2022-04-07 MED ORDER — IBUPROFEN 400 MG PO TABS
400.0000 mg | ORAL_TABLET | ORAL | Status: DC | PRN
  Administered 2022-04-12 – 2022-04-16 (×2): 400 mg
  Filled 2022-04-07 (×3): qty 1

## 2022-04-07 MED ORDER — LOPERAMIDE HCL 1 MG/7.5ML PO SUSP
2.0000 mg | ORAL | Status: DC | PRN
  Administered 2022-04-07 – 2022-04-09 (×9): 2 mg
  Filled 2022-04-07 (×11): qty 15

## 2022-04-07 MED ORDER — ORAL CARE MOUTH RINSE: 15.0000 mL | OROMUCOSAL | Status: DC | PRN

## 2022-04-07 MED ORDER — HYDROCERIN EX CREA: TOPICAL_CREAM | Freq: Every day | CUTANEOUS | Status: DC | PRN

## 2022-04-07 MED ORDER — OSMOLITE 1.5 CAL PO LIQD
996.0000 mL | ORAL | Status: DC
  Administered 2022-04-08 – 2022-04-10 (×3): 1000 mL
  Administered 2022-04-11 – 2022-04-12 (×2): 996 mL
  Administered 2022-04-13 – 2022-04-16 (×3): 1000 mL
  Filled 2022-04-07 (×23): qty 1000

## 2022-04-07 MED ORDER — KETOROLAC TROMETHAMINE 15 MG/ML IJ SOLN
INTRAMUSCULAR | Status: AC
  Administered 2022-04-07: 15 mg via INTRAVENOUS
  Filled 2022-04-07: qty 1

## 2022-04-07 MED ORDER — MAGNESIUM SULFATE 2 GM/50ML IV SOLN
2.0000 g | Freq: Once | INTRAVENOUS | Status: AC
  Administered 2022-04-07: 2 g via INTRAVENOUS
  Filled 2022-04-07: qty 50

## 2022-04-07 MED ORDER — ORAL CARE MOUTH RINSE
15.0000 mL | OROMUCOSAL | Status: DC
  Administered 2022-04-08 (×3): 15 mL via OROMUCOSAL

## 2022-04-07 MED ORDER — POTASSIUM CHLORIDE 20 MEQ PO PACK
20.0000 meq | PACK | Freq: Once | ORAL | Status: AC
  Administered 2022-04-07: 20 meq
  Filled 2022-04-07: qty 1

## 2022-04-07 MED ORDER — KETOROLAC TROMETHAMINE 15 MG/ML IJ SOLN
15.0000 mg | Freq: Once | INTRAMUSCULAR | Status: AC
  Administered 2022-04-07: 15 mg via INTRAVENOUS
  Filled 2022-04-07: qty 1

## 2022-04-07 MED ORDER — KETOROLAC TROMETHAMINE 15 MG/ML IJ SOLN
15.0000 mg | Freq: Three times a day (TID) | INTRAMUSCULAR | Status: AC | PRN
  Administered 2022-04-08 – 2022-04-09 (×4): 15 mg via INTRAVENOUS
  Filled 2022-04-07 (×4): qty 1

## 2022-04-07 MED ORDER — JEVITY 1.2 CAL PO LIQD
1000.0000 mL | ORAL | Status: AC
  Administered 2022-04-07: 1000 mL
  Filled 2022-04-07: qty 1000

## 2022-04-07 MED ORDER — IBUPROFEN 400 MG PO TABS
400.0000 mg | ORAL_TABLET | ORAL | Status: DC | PRN
  Administered 2022-04-07: 400 mg via ORAL
  Filled 2022-04-07 (×2): qty 1

## 2022-04-07 NOTE — Progress Notes (Signed)
NAME:  Vauda Muzio, MRN:  [id number], DOB:  Oct 06, 2000, LOS: 69 ADMISSION DATE:  03/19/2022, CONSULTATION DATE:  04/07/22 REFERRING MD:  drawbridge, CHIEF COMPLAINT:  shortness of breath and weakness   History of Present Illness:  Adlynn Larios is a 22 y.o. F with history of depression and suicidal ideation who presented to [facility] ED with sudden onset of shortness of breath and upper and lower extremity weakness.  History taken from Epic as patient is intubated and no family is at the bedside.  She initially had a headache and back pain prior to onset but denied abdominal pain or chest pain.  Vitals showed an initial temp of 73F, bloodwork with WBC 18k, UDS + for bezos and THC.   Her symptoms progressed to the point she wasn't speaking and ABG showed hypercarbic respiratory failure so pt was intubated.  She had some bradycardia and hypotension post-intubation, so was started on peripheral Levophed and given atropine during transport   Pertinent  Medical History   has a past medical history of Depression, Hyperlipidemia, and Kidney abscess.   Significant Hospital Events: Including procedures, antibiotic start and stop dates in addition to other pertinent events   2/16 presented to DB with all weakness and shortness of breath requiring intubation and transfer to [facility] 2/18 episodes of desaturations and hypercarbia unexplained 2/19 trach, cortrak, PICC 2/23 therapeutic bronch 2/24 Eikenella corrodens BAL. Not triggering vent  2/25 narrow abx from vanc zosyn to rocephin. Will reach out to OSH re transfer and consideration of diaphragmatic pacemaker - unfortunately no beds at [facility] and declined transfer at [facility] 2/26 fevers, abx broadened> vanc/ merrem, diuresing 2/27 chest CT, MRI brain, ongoing fevers, did not tolerate/ like PMV 2/28 trach aspirate resent as 2/26 neg.  Continued fevers.  Episode of vomiting after large bolus feeding.  Remains on 70% 2/29 ongoing fevers, working with SLP  for PMV 3/3 jump in scr, ?bladder outlet obstruction, foley replaced  3/4 creatinine has improved. When respiratory rate dropped, patient not able to initiate breath on her own.    Interim History / Subjective:  Continues to complaint of headache  Objective   Blood pressure (!) 109/58, pulse (!) 103, temperature 99.2 F (37.3 C), temperature source Oral, resp. rate 18, height '5\' 7"'$  (1.702 m), weight 98.7 kg, SpO2 99 %.    Vent Mode: PRVC FiO2 (%):  [30 %-40 %] 30 % Set Rate:  [22 bmp] 22 bmp Vt Set:  [490 mL] 490 mL PEEP:  [5 cmH20] 5 cmH20 Plateau Pressure:  [17 cmH20-19 cmH20] 18 cmH20   Intake/Output Summary (Last 24 hours) at 04/07/2022 0815 Last data filed at 04/07/2022 0700 Gross per 24 hour  Intake 1587.62 ml  Output 4090 ml  Net -2502.38 ml    Filed Weights   04/05/22 0500 04/06/22 0437 04/07/22 0413  Weight: 98.1 kg 99 kg 98.7 kg   General:  young fm, trach in place on vent  HEENT: NCAT, trach in place. Tenderness to palpation posterior occipital muscles.  Neuro: no movement of UE LE, communicating by mouthing words.  CV: RRR, s1 s2  PULM: equal air entry. No spontaneous triggering but patient set on 20/min with a minute ventilation of 9+L/min GI: soft nt nd  Extremities: dependent edema     Ancillary tests personally reviewed  VBG on RR of 14 with MV 6.6 l/min show pCO2 61.  Creatinine 0.75,  Assessment & Plan:   GOC / DNR status -clear GOC -- aggressive care/rehab x18mo If Qol  not acceptable at that point, transition to comfort care. Mom and medical team are supportive, and will continue to help facilitate best shot at recovery during this period of time.  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   - I suspect life long vent requirement and will need LTACH placement as patient is not able to respond to hypercarbic stimulus to trigger breath even with Flow Trigger set at 0.  Will not attempt weaning.  - Cough Assist device.  - Periodic I/O catheterization to manage neurogenic bladder would be better than Foley but patient is resistent - Complete course of Meropenem per ID. 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  - Continue current anti-anxiety regimen.  - Add low dose midodrine to help with postural hypotension.  - Ketorolac and ibuprofen for tension type headache.   Kipp Brood, MD [facility] ICU Physician [place]  Pager: [phone] Mobile: [phone] After hours: [phone].

## 2022-04-07 NOTE — Progress Notes (Signed)
Pt refused to do CPT at this time.

## 2022-04-07 NOTE — TOC Progression Note (Addendum)
Transition of Care [facility]) - Progression Note    Patient Details  Name: Kristyne Kindschi MRN: [id number] Date of Birth: May 23, 2000  Transition of Care [facility]) CM/SW [place], [place] Phone Number: 04/07/2022, 3:22 PM  Clinical Narrative:     CSW following to fax patient out to [facility] for Vent/SNF once patients trach is 73 days old. Patient passr pending. CSW following to submit clinicals to passr closer to patient being medically ready.CSW will continue to follow and assist with patients dc planning needs.  Expected Discharge Plan: [place] Barriers to Discharge: Continued Medical Work up  Expected Discharge Plan and Services In-house Referral: Clinical Social Work Discharge Planning Services: CM Consult   Living arrangements for the past 2 months: Single Family Home                                       Social Determinants of Health (SDOH) Interventions SDOH Screenings   Food Insecurity: No Food Insecurity (03/23/2022)  Housing: Low Risk  (03/23/2022)  Transportation Needs: No Transportation Needs (03/23/2022)  Utilities: Not At Risk (03/23/2022)  Tobacco Use: Medium Risk (03/26/2022)    Readmission Risk Interventions     No data to display

## 2022-04-07 NOTE — Progress Notes (Signed)
Physical Therapy Treatment Patient Details Name: Rachael [name] MRN: [id number] DOB: 2000-12-26 Today's Date: 04/07/2022   History of Present Illness Rachael [name] is a 22 y.o. female admitted 03/19/22 with acute arms and leg weakness, dyspnea and hypercabia.  (+) for polysubstances. Pt was intubated on 2/16. 2/19 trach, cortrak, PICC.  MRI confirms anteromedial high cervical spinal cord and lower medulla stroke consistent with an anterior spinal artery stroke  PMH significant for depression, HLD, Kidney abscess    PT Comments    Per RN report pt has been refusing all care revolving around movement. Pt refuses to be turned or to have HOB greater than 30 deg. Pt did participate with therapy this date and tolerated HOB >46 deg (up to 56 deg) for 12 min. Pt returned and left with HOB at 45 deg in which patient reported feeling okay. VSS. PROM provided to all jointsx4 extremities. Pt with noted bilat hip tightness with hip flexion and rotation. Unsure of prolonged medical plan or patient's goals of care. Acute PT to cont to follow.    Recommendations for follow up therapy are one component of a multi-disciplinary discharge planning process, led by the attending physician.  Recommendations may be updated based on patient status, additional functional criteria and insurance authorization.  Follow Up Recommendations  Skilled nursing-short term rehab (<3 hours/day) Can patient physically be transported by private vehicle: No   Assistance Recommended at Discharge Frequent or constant Supervision/Assistance  Patient can return home with the following Two people to help with walking and/or transfers;Two people to help with bathing/dressing/bathroom;Assistance with cooking/housework;Assistance with feeding;Direct supervision/assist for medications management;Direct supervision/assist for financial management;Assist for transportation;Help with stairs or ramp for entrance   Equipment Recommendations   Hospital bed;Wheelchair (measurements PT);Wheelchair cushion (measurements PT);Other (comment) (tilt in space power chair, hoyer)    Recommendations for Other Services       Precautions / Restrictions Precautions Precautions: Other (comment) Precaution Comments: Trach, vent, cortrak, quadriplegia, flexiseal Restrictions Weight Bearing Restrictions: No     Mobility  Bed Mobility Overal bed mobility: Needs Assistance             General bed mobility comments: elevated HOB to 46 deg for 5 min and then 56 deg for 7 min. Pt declined going further to 60 deg. Pt without nystagmus movement in her eyes this date. Pt tolerated HOB >46 degrees for total of 12 minutes this date and return to 45 deg and reported feeling well with HOB at 45 deg when PT left. RN notified and aware    Transfers                   General transfer comment: did not attempt to elevate head anymore due to flexiseal coming out    Ambulation/Gait                   Stairs             Wheelchair Mobility    Modified Rankin (Stroke Patients Only) Modified Rankin (Stroke Patients Only) Pre-Morbid Rankin Score: No symptoms Modified Rankin: Severe disability     Balance Overall balance assessment: Needs assistance     Sitting balance - Comments: pt dependent on egress function of bed at this time. Pt tolerated HOB up to 56 deg this date and adamantly refused to go up higher. Pt started with HOB at 32 deg. Pt left with HOB at 45 deg and pt reported it feeling okay  Cognition Arousal/Alertness: Awake/alert Behavior During Therapy: Flat affect Overall Cognitive Status: Within Functional Limits for tasks assessed (communicated through mouthing)                                 General Comments: pt with depressed spirits requiring max encouragement to engage in care by RN staff. Pt however worked with PT without resistance this  date.        Exercises General Exercises - Upper Extremity Shoulder Flexion: PROM, Both, 10 reps, Supine Shoulder ABduction: PROM, Both, 10 reps, Supine Elbow Flexion: PROM, Both, 10 reps, Supine Elbow Extension: PROM, Both, 10 reps, Supine Wrist Flexion: PROM, Both, 10 reps, Supine Wrist Extension: PROM, Both, 10 reps, Supine General Exercises - Lower Extremity Ankle Circles/Pumps: PROM, Both, 10 reps, Supine Short Arc Quad: PROM, Both, 10 reps, Supine Hip ABduction/ADduction: PROM, Both, 10 reps, Supine Hip Flexion/Marching: PROM, Both, 10 reps, Supine    General Comments General comments (skin integrity, edema, etc.): VSS, pt with edema in bilat hands      Pertinent Vitals/Pain Pain Assessment Pain Assessment: Faces Faces Pain Scale: Hurts little more Pain Location: bilat hip tightness with ROM    Home Living                          Prior Function            PT Goals (current goals can now be found in the care plan section) Acute Rehab PT Goals PT Goal Formulation: With patient/family Time For Goal Achievement: 04/15/22 Potential to Achieve Goals: Poor Progress towards PT goals: Not progressing toward goals - comment    Frequency    Min 2X/week      PT Plan Current plan remains appropriate    Co-evaluation              AM-PAC PT "6 Clicks" Mobility   Outcome Measure  Help needed turning from your back to your side while in a flat bed without using bedrails?: Total Help needed moving from lying on your back to sitting on the side of a flat bed without using bedrails?: Total Help needed moving to and from a bed to a chair (including a wheelchair)?: Total Help needed standing up from a chair using your arms (e.g., wheelchair or bedside chair)?: Total Help needed to walk in hospital room?: Total Help needed climbing 3-5 steps with a railing? : Total 6 Click Score: 6    End of Session Equipment Utilized During Treatment: Oxygen (via  trach) Activity Tolerance: Patient limited by fatigue;Patient limited by pain Patient left: in bed;with call bell/phone within reach;with nursing/sitter in room;with family/visitor present Nurse Communication: Mobility status;Need for lift equipment PT Visit Diagnosis: Muscle weakness (generalized) (M62.81);Other symptoms and signs involving the nervous system (R29.898)     Time: [id number] PT Time Calculation (min) (ACUTE ONLY): 19 min  Charges:  $Therapeutic Exercise: 8-22 mins                     Kittie Plater, PT, DPT Acute Rehabilitation Services Secure chat preferred Office #: [phone]    Berline Lopes 04/07/2022, 1:14 PM

## 2022-04-07 NOTE — Progress Notes (Signed)
eLink Physician-Brief Progress Note Patient Name: Rachael [name] DOB: 2000/03/31 MRN: [id number]   Date of Service  04/07/2022  HPI/Events of Note  Please clarify tube feeding order. Nurse was told that Jevity could continue until the AM and then switch to Osmolite in the am. Jevity order was d/c'd tonight. Nutrition note does state that,  "Pt currently tolerating Jevity 1.2 at rate of 50 ml/hr via PEG tube. Pt also receiving Pro-Source TF20 60 mL QID.   Pt previously did not tolerate bolus feedings due to administration issue (too much volume at one time-600 mL with TF, extra water and meds). Given hunger, RD discussed some other options for feedings. Pt can continue the 24 hour feeding OR RD discussed changing to a shorter continuous feeding to run during the day only, TF held at night. Larger volume per hour but not as large as a bolus feeding which may help with feeling of satiety but not cause intolerance. Pt wanting to give this a try. "  eICU Interventions  Hold tube feeds since Jevity expired, readdress AM feeds with RD in the morning.    0022 - Bp is soft 97/44 map 59 after receiving trazadone, ambien and klonopin. She is now asleep. No intervention indicated   Intervention Category Minor Interventions: Routine modifications to care plan (e.g. PRN medications for pain, fever)  Quanita Barona 04/07/2022, 9:53 PM

## 2022-04-07 NOTE — Progress Notes (Addendum)
Nutrition Follow-up  DOCUMENTATION CODES:   Not applicable  INTERVENTION:   Plan to trial 12 hour day time feedings in effort to promote feeling of satiety. If tolerates this, could consider further modifying to 8 or 10 hour feedings.   Tube Feeding via PEG: Plan to trial Osmolite formula to see if this helps with stool consistency Osmolite 1.5 at 83 ml/hr x 12 hours  Pro-source TF20 60 mL QID This provides 1734 kcals, 142 g of protein and 757 mL of free water.   Current TF plus free water provides 957 mL of free water. This does not include free water administration with meds, for tube maintenance etc. Continue to monitor fluid status, may need to increase free water flushes  Continue Banatrol TF QID, goal is to decrease frequency of stool and increase consistency    NUTRITION DIAGNOSIS:   Inadequate oral intake related to inability to eat as evidenced by NPO status.  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   PMH includes chronic prescription opioid and benzo use, depression with SI, HLD, kidney abscess. Pt is daily smoker; EtOH abuse, daily drinker-at least half pint  2/16 Admitted 2/19 Trach placed, Cortrak placed (distal stomach vs proximal duodenum) 2/23 Bronch: L mainstem mucous plugging, thick secretions, IR placed PEG tube 2/24 TF resumed via PEG, changed formula to Jevity 2/27 MRI brain-evolving infarct of upper cervical cord, trial of bolus feedings 2/28 Changed back to 24  hour continuous feedings as pt with significant nausea post bolus feeding (unfortunately pt received total of 600 mL with formula, extra free water and meds) 3/01 FEES performed while on vent via trach; allowed sips of water, otherwise NPO   Pt remains on vent support via trach; unable to wean  SLP continues to work with patient, PMV trials. Pt allows sips of water; high aspiration risk  Pt denies any GI issues; no abdominal pain, no nausea/vomiting. Pt does report she continues to feel hungry.    Pt currently tolerating Jevity 1.2 at rate of 50 ml/hr via PEG tube. Pt also receiving Pro-Source TF20 60 mL QID.  Pt previously did not tolerate bolus feedings due to administration issue (too much volume at one time-600 mL with TF, extra water and meds). Given hunger, RD discussed some other options for feedings. Pt can continue the 24 hour feeding OR RD discussed changing to a shorter continuous feeding to run during the day only, TF held at night. Larger volume per hour but not as large as a bolus feeding which may help with feeling of satiety but not cause intolerance. Pt wanting to give this a try.   Of note, RN indicating that pt does not like for HOB >30 degrees. From a feeding standpoint, this does increase patient's risk of aspiration of TF and GI contents. Encouraged HOB >30 degrees at least while TF infusing during the day  Pt reports she no longer feels thirsty all the time. Continuing free water flushes via PEG. Current flush of 100 mL q  12 hours. This does not includes flushes provided during medication administration, tube maintenance etc. Pt also receiving free water from TF  Per RN, having difficulty keeping rectal tube in place. Stool is mucousy, yellow/green/brown per RN. Pt currently on Banatrol TF20 60 mL QID; pt on fiber containing formula as well. Noted imodium prn ordered starting today  No pressure injuries per RN skin assessment. Pt does have however significant MASD to  buttocks. Pt also with sore spot on back of head, thought to be related to her braids being left in too long, breads has been removed.   RN reports pt does not like to be turned which places her at higher risk for skin breakdown. Close monitoring of skin recommended, goal to keep skin as dry as possible currently. Discussed possibly using rectal pouch instead of rectal tube.   Current wt 98.7 kg; weight relatively stable overall.   Labs: Creatinine wdl, sodium 135 (wdl) Meds: MVI with Minerals, thiamine, folic acid, [id number]   Diet Order:   Diet Order             Diet NPO time specified  Diet effective now                   EDUCATION NEEDS:   Not appropriate for education at this time  Skin:  Skin Assessment: Skin Integrity Issues: Skin Integrity Issues:: Incisions, Other (Comment) Incisions: new PEG tube Other: MASD to buttocks  Last BM:  3/6 large liquid stool, difficulty keeping rectal tube in place  Height:   Ht Readings from Last 1 Encounters:  03/22/22 '5\' 7"'$  (1.702 m)    Weight:   Wt Readings from Last 1 Encounters:  04/07/22 98.7 kg    Ideal Body Weight:  61.4 kg  BMI:  Body mass index is 34.08 kg/m.  Estimated Nutritional Needs:   Kcal:  1700-1900 kcals  Protein:  120-150 g  Fluid:  >/= 2L  Kerman Passey MS, RDN, LDN, CNSC Registered Dietitian 3 Clinical Nutrition RD Pager and On-Call Pager Number Located in [place]

## 2022-04-07 NOTE — [place] FL2 (Signed)
[place] LEVEL OF CARE FORM     IDENTIFICATION  Patient Name: Rachael [name] Birthdate: January 27, 2001 Sex: female Admission Date (Current Location): 03/19/2022  [facility] and [place] Number:  Herbalist and Address:  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[place]) 04/03/2022   Acute respiratory failure with hypoxia ([place]) 04/03/2022   Fever 04/03/2022   Hypothermia 03/25/2022   Community acquired pneumonia 03/25/2022   Cerebrovascular accident (CVA) ([place]) 03/23/2022   Chronic respiratory failure with hypoxia and hypercapnia ([place]) 03/22/2022   Paralysis ([place]) 03/20/2022   Altered mental state 03/19/2022   Vaginal delivery 11/16/2019   IUGR (intrauterine growth restriction) affecting care of mother, third trimester, fetus 1 11/15/2019   Pregnancy affected by fetal growth restriction 10/08/2019   Supervision of normal pregnancy, antepartum 05/15/2019   High risk sexual behavior in adolescent    Pyelonephritis 06/21/2017   Suicidal ideation 04/02/2016   MDD (major depressive disorder) 04/01/2016    Orientation RESPIRATION BLADDER Height & Weight     Self, Time, Situation, Place  Vent, Tracheostomy (Fi02 30%,Trach [place] 80m cuffed) Continent (Urethral catheter) Weight: 217 lb 9.5 oz (98.7 kg) Height:  '5\' 7"'$  (170.2 cm)  BEHAVIORAL SYMPTOMS/MOOD NEUROLOGICAL BOWEL NUTRITION STATUS      Incontinent Feeding tube  AMBULATORY  STATUS COMMUNICATION OF NEEDS Skin      (trach) Other (Comment) (Wound/Incision LDAs,Wound/Incision open or dehiced,Irritant Dermatitis,MASD,Buttocks,R,L,moist,reddened and peeling skin of bilateral buttocks area,Wound/Incision open or dehiced non-pressure wound,head,lower,posterior)                       Personal Care Assistance Level of Assistance  Bathing, Feeding, Dressing Bathing Assistance: Maximum assistance Feeding assistance: Maximum assistance (Jevity 1.2 at 50 ml/hr, Pro-source TF20 60 mL QID) Dressing Assistance: Maximum assistance     Functional Limitations Info  Sight, Hearing, Speech Sight Info: Adequate Hearing Info: Adequate Speech Info: Impaired (trach)    SPECIAL CARE FACTORS FREQUENCY  PT (By licensed PT), OT (By licensed OT)     PT Frequency: 5x min weekly OT Frequency: 5x min weekly            Contractures Contractures Info: Not present    Additional Factors Info  Code Status, Allergies, Psychotropic, Insulin Sliding Scale Code Status Info: DNR Allergies Info: NKA Psychotropic Info: clonazePAM (KLONOPIN) disintegrating tablet 0.5 mg 3 times daily,sertraline (ZOLOFT) tablet 50 mg daily, traZODone (DESYREL) tablet 100 mg daily at bedtime Insulin Sliding Scale Info: insulin aspart (novoLOG) injection 2-6 Units every 4 hours       Current Medications (04/07/2022):  This is the current hospital active medication list Current Facility-Administered Medications  Medication Dose Route Frequency Provider Last Rate Last Admin   0.9 %  sodium chloride infusion  250 mL Intravenous Continuous KDorie Rank MD 20 mL/hr at 04/07/22 1300 Infusion Verify at 04/07/22 1300   acetaminophen (TYLENOL) 160 MG/5ML solution 650 mg  650 mg Per Tube Q6H PRN Bitonti,  Alphonse Guild, RPH   650 mg at 04/07/22 [id number]   aspirin chewable tablet 81 mg  81 mg Per Tube Daily Julian Hy, DO   81 mg at 04/07/22 [id number]   Chlorhexidine Gluconate Cloth 2 % PADS 6 each  6 each Topical Daily  Collier Bullock, MD   6 each at 04/07/22 [id number]   clonazePAM (KLONOPIN) disintegrating tablet 0.5 mg  0.5 mg Per Tube TID Jennelle Human B, NP   0.5 mg at 04/07/22 1527   clopidogrel (PLAVIX) tablet 75 mg  75 mg Per Tube Daily Rosalin Hawking, MD   75 mg at 04/07/22 [id number]   cyanocobalamin (VITAMIN B12) tablet 1,000 mcg  1,000 mcg Per Tube Daily Rosalin Hawking, MD   1,000 mcg at 04/07/22 0813   cyclobenzaprine (FLEXERIL) tablet 5 mg  5 mg Per Tube TID PRN Kipp Brood, MD   5 mg at 04/07/22 0812   docusate (COLACE) 50 MG/5ML liquid 100 mg  100 mg Per Tube BID PRN Jennelle Human B, NP   100 mg at 03/31/22 0841   enoxaparin (LOVENOX) injection 40 mg  40 mg Subcutaneous Daily Noemi Chapel P, DO   40 mg at 04/07/22 0813   famotidine (PEPCID) tablet 20 mg  20 mg Per Tube BID Gleason, Otilio Carpen, PA-C   20 mg at 04/07/22 [id number]   feeding supplement (JEVITY 1.2 CAL) liquid 1,000 mL  1,000 mL Per Tube Continuous Kipp Brood, MD 90 mL/hr at 04/07/22 1340 1,000 mL at 04/07/22 1340   [START ON 04/08/2022] feeding supplement (OSMOLITE 1.5 CAL) liquid 996 mL  996 mL Per Tube Continuous Kipp Brood, MD       feeding supplement (PROSource TF20) liquid 60 mL  60 mL Per Tube TID Noemi Chapel P, DO   60 mL at 04/07/22 1527   fiber supplement (BANATROL TF) liquid 60 mL  60 mL Per Tube QID Noemi Chapel P, DO   60 mL at [id number]   folic acid (FOLVITE) tablet 1 mg  1 mg Per Tube Daily Rosalin Hawking, MD   1 mg at 04/07/22 [id number]   free water 100 mL  100 mL Per Tube Q12H Cristal Generous, NP   100 mL at 04/07/22 [id number]   Gerhardt's butt cream   Topical PRN Kipp Brood, MD   1 Application at [id number]   hydrocerin (EUCERIN) cream   Topical Daily PRN Kipp Brood, MD       influenza vac split quadrivalent PF (FLUARIX) injection 0.5 mL  0.5 mL Intramuscular Prior to discharge Kipp Brood, MD       insulin aspart (novoLOG) injection 2-6 Units  2-6 Units Subcutaneous Q4H Kipp Brood, MD   2 Units at 04/07/22 1251   iohexol  (OMNIPAQUE) 300 MG/ML solution 50 mL  50 mL Per Tube Once PRN El-Abd, Joesph Fillers, MD       ipratropium-albuterol (DUONEB) 0.5-2.5 (3) MG/3ML nebulizer solution 3 mL  3 mL Nebulization Q4H PRN Agarwala, Einar Grad, MD   3 mL at 04/07/22 0305   loperamide HCl (IMODIUM) 1 MG/7.5ML suspension 2 mg  2 mg Per Tube PRN Agarwala, Einar Grad, MD   2 mg at 04/07/22 1529   melatonin tablet 3 mg  3 mg Per Tube QHS Kipp Brood, MD   3 mg at 04/06/22 2114   midodrine (PROAMATINE) tablet 2.5 mg  2.5 mg Per Tube TID WC Kipp Brood, MD   2.5 mg at 04/07/22 1113   multivitamin with minerals tablet 1 tablet  1 tablet Per Tube Daily Julian Hy, DO   1 tablet at 04/07/22 0811   ondansetron (ZOFRAN) 8 mg in sodium chloride 0.9 % 50 mL IVPB  8 mg Intravenous Once PRN Noemi Chapel P, DO       ondansetron [facility]) injection 4 mg  4 mg Intravenous Q6H PRN Noemi Chapel P, DO   4 mg at 04/03/22 [id number]   Oral care mouth rinse  15 mL Mouth Rinse 4 times per day Kipp Brood, MD       Oral care mouth rinse  15 mL Mouth Rinse PRN Kipp Brood, MD       pneumococcal 20-valent conjugate vaccine (PREVNAR 20) injection 0.5 mL  0.5 mL Intramuscular Prior to discharge Kipp Brood, MD       polyethylene glycol (MIRALAX / GLYCOLAX) packet 17 g  17 g Per Tube Daily PRN Candee Furbish, MD       promethazine (PHENERGAN) 6.25 mg in sodium chloride 0.9 % 50 mL IVPB  6.25 mg Intravenous Once Noemi Chapel P, DO       rosuvastatin (CRESTOR) tablet 20 mg  20 mg Per Tube Daily Agarwala, Einar Grad, MD   20 mg at 04/07/22 0814   sertraline (ZOLOFT) tablet 50 mg  50 mg Per Tube Daily Bowser, Laurel Dimmer, NP   50 mg at 04/07/22 0809   sodium chloride 0.9 % bolus 500 mL  500 mL Intravenous Once Kamat, Sunil G, MD       sodium chloride flush (NS) 0.9 % injection 10-40 mL  10-40 mL Intracatheter Q12H Julian Hy, DO   10 mL at 04/05/22 2124   sodium chloride flush (NS) 0.9 % injection 10-40 mL  10-40 mL Intracatheter PRN Noemi Chapel P, DO       sodium  chloride HYPERTONIC 3 % nebulizer solution 4 mL  4 mL Nebulization Q4H Noemi Chapel P, DO   4 mL at 04/07/22 1130   thiamine (VITAMIN B1) tablet 100 mg  100 mg Per Tube Daily Rosalin Hawking, MD   100 mg at 04/07/22 [id number]   traMADol (ULTRAM) tablet 50 mg  50 mg Per Tube Q6H PRN Kipp Brood, MD   50 mg at 04/07/22 0231   traZODone (DESYREL) tablet 100 mg  100 mg Per Tube QHS Julian Hy, DO   100 mg at 04/06/22 2113   zolpidem (AMBIEN) tablet 5 mg  5 mg Per Tube QHS PRN Julian Hy, DO   5 mg at 04/05/22 2124     Discharge Medications: Please see discharge summary for a list of discharge medications.  Relevant Imaging Results:  Relevant Lab Results:   Additional Information [id number]  Milas Gain, LCSWA

## 2022-04-08 DIAGNOSIS — N179 Acute kidney failure, unspecified: Secondary | ICD-10-CM | POA: Diagnosis not present

## 2022-04-08 LAB — BASIC METABOLIC PANEL
Anion gap: 11 (ref 5–15)
BUN: 23 mg/dL — ABNORMAL HIGH (ref 6–20)
CO2: 25 mmol/L (ref 22–32)
Calcium: 8.8 mg/dL — ABNORMAL LOW (ref 8.9–10.3)
Chloride: 101 mmol/L (ref 98–111)
Creatinine, Ser: 0.66 mg/dL (ref 0.44–1.00)
GFR, Estimated: >60 mL/min (ref >60)
Glucose, Bld: 121 mg/dL — ABNORMAL HIGH (ref 70–99)
Potassium: 3.7 mmol/L (ref 3.5–5.1)
Sodium: 137 mmol/L (ref 135–145)

## 2022-04-08 LAB — GLUCOSE, CAPILLARY
Glucose-Capillary: 127 mg/dL — ABNORMAL HIGH (ref 70–99)
Glucose-Capillary: 130 mg/dL — ABNORMAL HIGH (ref 70–99)
Glucose-Capillary: 138 mg/dL — ABNORMAL HIGH (ref 70–99)
Glucose-Capillary: 162 mg/dL — ABNORMAL HIGH (ref 70–99)
Glucose-Capillary: 166 mg/dL — ABNORMAL HIGH (ref 70–99)
Glucose-Capillary: 170 mg/dL — ABNORMAL HIGH (ref 70–99)
Glucose-Capillary: 173 mg/dL — ABNORMAL HIGH (ref 70–99)

## 2022-04-08 LAB — CBC
HCT: 27.1 % — ABNORMAL LOW (ref 36.0–46.0)
Hemoglobin: 8.8 g/dL — ABNORMAL LOW (ref 12.0–15.0)
MCH: 28.4 pg (ref 26.0–34.0)
MCHC: 32.5 g/dL (ref 30.0–36.0)
MCV: 87.4 fL (ref 80.0–100.0)
Platelets: 323 10*3/uL (ref 150–400)
RBC: 3.1 MIL/uL — ABNORMAL LOW (ref 3.87–5.11)
RDW: 14.5 % (ref 11.5–15.5)
WBC: 31.1 10*3/uL — ABNORMAL HIGH (ref 4.0–10.5)
nRBC: 0 % (ref 0.0–0.2)

## 2022-04-08 LAB — MAGNESIUM: Magnesium: 1.8 mg/dL (ref 1.7–2.4)

## 2022-04-08 MED ORDER — ACETAMINOPHEN 325 MG PO TABS
650.0000 mg | ORAL_TABLET | Freq: Four times a day (QID) | ORAL | Status: DC | PRN
  Administered 2022-04-08 – 2022-04-17 (×12): 650 mg
  Filled 2022-04-08 (×12): qty 2

## 2022-04-08 MED ORDER — FLUCONAZOLE 100 MG PO TABS
100.0000 mg | ORAL_TABLET | Freq: Every day | ORAL | Status: AC
  Administered 2022-04-08 – 2022-04-12 (×5): 100 mg
  Filled 2022-04-08 (×5): qty 1

## 2022-04-08 MED ORDER — MAGNESIUM SULFATE 2 GM/50ML IV SOLN
2.0000 g | Freq: Once | INTRAVENOUS | Status: AC
  Administered 2022-04-08: 2 g via INTRAVENOUS
  Filled 2022-04-08: qty 50

## 2022-04-08 MED ORDER — SACCHAROMYCES BOULARDII 250 MG PO CAPS
250.0000 mg | ORAL_CAPSULE | Freq: Two times a day (BID) | ORAL | Status: DC
  Administered 2022-04-08 – 2022-04-09 (×3): 250 mg
  Filled 2022-04-08 (×4): qty 1

## 2022-04-08 MED ORDER — POTASSIUM CHLORIDE 20 MEQ PO PACK
40.0000 meq | PACK | Freq: Once | ORAL | Status: AC
  Administered 2022-04-08: 40 meq
  Filled 2022-04-08: qty 2

## 2022-04-08 NOTE — [place] Notes (Signed)
NAME:  Rinad Conaway, MRN:  [id number], DOB:  Feb 09, 2000, LOS: 35 ADMISSION DATE:  03/19/2022, CONSULTATION DATE:  04/08/22 REFERRING MD:  drawbridge, CHIEF COMPLAINT:  shortness of breath and weakness   History of Present Illness:  Aleni Bentsen is a 22 y.o. F with history of depression and suicidal ideation who presented to [facility] ED with sudden onset of shortness of breath and upper and lower extremity weakness.  History taken from Epic as patient is intubated and no family is at the bedside.  She initially had a headache and back pain prior to onset but denied abdominal pain or chest pain.  Vitals showed an initial temp of 64F, bloodwork with WBC 18k, UDS + for bezos and THC.   Her symptoms progressed to the point she wasn't speaking and ABG showed hypercarbic respiratory failure so pt was intubated.  She had some bradycardia and hypotension post-intubation, so was started on peripheral Levophed and given atropine during transport   Pertinent  Medical History   has a past medical history of Depression, Hyperlipidemia, and Kidney abscess.   Significant Hospital Events: Including procedures, antibiotic start and stop dates in addition to other pertinent events   2/16 presented to [facility] with all weakness and shortness of breath requiring intubation and transfer to [facility] 2/18 episodes of desaturations and hypercarbia unexplained 2/19 trach, cortrak, PICC 2/23 therapeutic bronch 2/24 Eikenella corrodens BAL. Not triggering vent  2/25 narrow abx from vanc zosyn to rocephin. Will reach out to OSH re transfer and consideration of diaphragmatic pacemaker - unfortunately no beds at [facility] and declined transfer at [facility] 2/26 fevers, abx broadened> vanc/ merrem, diuresing 2/27 chest CT, MRI brain, ongoing fevers, did not tolerate/ like PMV 2/28 trach aspirate resent as 2/26 neg.  Continued fevers.  Episode of vomiting after large bolus feeding.  Remains on 70% 2/29 ongoing fevers, working with SLP  for PMV 3/3 jump in scr, ?bladder outlet obstruction, foley replaced  3/4 creatinine has improved. When respiratory rate dropped, patient not able to initiate breath on her own.  3/7 creatinine has improved. Complaining of headache. Still having diarrhea.    Interim History / Subjective:  Unchanged.   Objective   Blood pressure (!) 106/56, pulse (!) 114, temperature (!) 102.3 F (39.1 C), temperature source Oral, resp. rate (!) 22, height '5\' 7"'$  (1.702 m), weight 98.7 kg, SpO2 96 %.    Vent Mode: PRVC FiO2 (%):  [40 %] 40 % Set Rate:  [22 bmp] 22 bmp Vt Set:  [490 mL] 490 mL PEEP:  [5 cmH20] 5 cmH20 Pressure Support:  [10 cmH20] 10 cmH20 Plateau Pressure:  [16 cmH20-18 cmH20] 17 cmH20   Intake/Output Summary (Last 24 hours) at 04/08/2022 1729 Last data filed at 04/08/2022 1600 Gross per 24 hour  Intake 1559.42 ml  Output 2025 ml  Net -465.58 ml    Filed Weights   04/05/22 0500 04/06/22 0437 04/07/22 0413  Weight: 98.1 kg 99 kg 98.7 kg   General:  young fm, trach in place on vent  HEENT: NCAT, trach in place. Tenderness to palpation posterior occipital muscles.  Neuro: no movement of UE LE, communicating by mouthing words.  CV: RRR, s1 s2  PULM: equal air entry. No spontaneous triggering but patient set on 20/min with a minute ventilation of 9+L/min GI: soft nt nd  Extremities: dependent edema     Ancillary tests personally reviewed  VBG on RR of 14 with MV 6.6 l/min show pCO2 61.  Creatinine 0.75,  Assessment &  Plan:   [place] / DNR status -clear GOC -- aggressive care/rehab x76mo If Qol not acceptable at that point, transition to comfort care. Mom and medical team are supportive, and will continue to help facilitate best shot at recovery during this period of time.  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  Will not attempt weaning.  - Cough Assist device.  - Periodic I/O catheterization to manage neurogenic bladder would be better than Foley but patient is resistent - Complete course of Meropenem per ID. 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  - Continue current anti-anxiety regimen.  - Add low dose midodrine to help with postural hypotension.  - Ketorolac and ibuprofen for tension type headache.  - Increase fiber and stop all liquid medications.   RKipp Brood MD [facility] Physician [place] Pager: [phone]: [phone] hours: [phone].

## 2022-04-08 NOTE — [place] Notes (Signed)
Pt refused CPT at this time. Pt just got cleaned up and turned at this time. Will attempt to do it at next scheduled time.

## 2022-04-08 NOTE — [place] Notes (Addendum)
Nutrition Follow-up  DOCUMENTATION CODES:   Not applicable  INTERVENTION:   Tube Feeding via PEG:  Osmolite 1.5 at 83 ml/hr x 12 hours  Pro-source TF20 60 mL QID This provides 1734 kcals, 142 g of protein and 757 mL of free water.    Current TF plus free water provides 957 mL of free water. This does not include free water administration with meds, for tube maintenance etc. Continue to monitor fluid status, may need to increase free water flushes especially given frequent stool   Recommend changing any liquid suspension medications to a different form (pill form if able that can go per PEG) as these are high in osmolality and may be contributing to diarrhea: Discussed with Dr. Lynetta Mare and RN 1) Change Tylenol 650 mg suspension q 6 hours prn to 650 mg tablet q 6 hours prn 2) Change imodium suspension to imodium capsule (if able to go per tube?) or a different anti-diarrheal  Continue Banatrol TF20 60 mL QID  Add probiotic, Florastor, BID per tube. Not sure this will help but not likely to hurt  NUTRITION DIAGNOSIS:   Inadequate oral intake related to inability to eat as evidenced by NPO status.  Being addressed via TF   GOAL:   Patient will meet greater than or equal to 90% of their needs   MONITOR:   Vent status, TF tolerance, I & O's, Labs  REASON FOR ASSESSMENT:   Consult Enteral/tube feeding initiation and management  ASSESSMENT:   22 yo Rachael [name] presents to ED on 2/Rachael with sudden onset SOB and sudden upper and lower extremity weakness and admitted with acute flaccid paralysis secondary to anterior spinal cord infarction and acute respiratory failure due to neuromuscular weakness requiring intubation.   PMH includes chronic prescription opioid and benzo use, depression with SI, HLD, kidney abscess. Pt is daily smoker; EtOH abuse, daily drinker-at least half pint  2/Rachael Admitted 2/19 Trach placed, Cortrak placed (distal stomach vs proximal duodenum) 2/23 Bronch: L  mainstem mucous plugging, thick secretions, IR placed PEG tube 2/24 TF resumed via PEG, changed formula to Jevity 2/27 MRI brain-evolving infarct of upper cervical cord, trial of bolus feedings 2/28 Changed back to 24 hour continuous feedings as pt with significant nausea post bolus feeding (unfortunately pt received total of 600 mL with formula, extra free water and meds) 3/01 FEES performed while on vent via trach; allowed sips of water, otherwise NPO  Pt remains on vent support via trach  Pt denies thirst or hunger today. Pt denies any abdominal pain, nausea.  Noted some confusion overnight regarding TF orders. TF held at 2200 (supposed to be held at 2000).  New Osmolite 1.5 TF at rate of 83 ml/hr started today at 0800 and to run until 2000 tonight. Discussed with RN.   Pt frequent liquid stool continues, it appears to actually be worse after starting imodium yesterday; pt received 7 doses so far and no improvement. Pt with no rectal tone and rectal tube keeps falling out; RN tried rectal pouch but it would not stay in place. Now pt with worsening MASD to buttocks.   Pt previously getting Jevity 1.2 (fiber containing formula) at 50 ml/hr plus 20 g of soluble fiber from banatrolTF (total of 40 g of fiber) and still having liquid, frequent stools. Changed to Osmolite 1.5 today, fiber free formula, to see if this helps. Continue BanatrolTF. RD does not believe adding nutrisource fiber or equivalent will help.   Noted pt on a few liquid suspension  medicine, Tylenol and Imodium (started yesterday). Pt receiving Tylenol 4 times daily (prn but receiving all doses) and received 7 doses of Imodium in 24 hours with no improvement in diarrhea. Liquid suspension medicines have  VERY high osmolalities and can contribute to diarrhea. Recommend changing to non-liquid form  Labs:sodium 137 (trending up), Creatinine wdl Meds: reviewed   Diet Order:   Diet Order             Diet NPO time specified  Diet  effective now                   EDUCATION NEEDS:   Not appropriate for education at this time  Skin:  Skin Assessment: Skin Integrity Issues: Skin Integrity Issues:: Incisions, Other (Comment) Incisions: new PEG tube Other: MASD to buttocks  Last BM:  3/7 multiple large type 7 stools, no rectal tone, unable to keep rectal tube in place  Height:   Ht Readings from Last 1 Encounters:  03/22/22 '5\' 7"'$  (1.702 m)    Weight:   Wt Readings from Last 1 Encounters:  04/07/22 98.7 kg    Ideal Body Weight:  61.4 kg  BMI:  Body mass index is 34.08 kg/m.  Estimated Nutritional Needs:   Kcal:  1700-1900 kcals  Protein:  120-150 g  Fluid:  >/= 2L   Kerman Passey MS, RDN, LDN, CNSC Registered Dietitian 3 Clinical Nutrition RD Pager and On-Call Pager Number Located in [place]

## 2022-04-08 NOTE — Progress Notes (Signed)
Occupational Therapy Treatment Patient Details Name: Rachael [name] MRN: [id number] DOB: 12-20-2000 Today's Date: 04/08/2022   History of present illness Rachael [name] is a 22 y.o. female admitted 03/19/22 with acute arms and leg weakness, dyspnea and hypercabia.  (+) for polysubstances. Pt was intubated on 2/16. 2/19 trach, cortrak, PICC.  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 However, Pt reports increasing sensations of spasms in BLEs. PROM performed by OT to maintain joint integrity with no visual/verbal indications of pain. Pt to continue to receive OT in acute care to address above deficits. OT continues to recommend patient for LTAC following post acute services.  During next goal update, determine need for continuation of acute OT services.    Recommendations for follow up therapy are one component of a multi-disciplinary discharge planning process, led by the attending physician.  Recommendations may be updated based on patient status, additional functional criteria and insurance authorization.    Follow Up Recommendations  OT at Long-term acute care hospital     Assistance Recommended at Discharge Frequent or constant Supervision/Assistance  Patient can return home with the following  Two people to help with walking and/or transfers;A lot of help with walking and/or transfers;Two people to help with bathing/dressing/bathroom;Assistance with feeding;Assistance with cooking/housework;Assist for transportation;A lot of help with bathing/dressing/bathroom   Equipment Recommendations  Other (comment) (TBD at next level of care)    Recommendations for Other Services      Precautions / Restrictions Precautions Precautions: Fall Precaution Comments: bed  bound Restrictions Weight Bearing Restrictions: No       Mobility Bed Mobility                    Transfers                         Balance                                           ADL either performed or assessed with clinical judgement   ADL                                              Extremity/Trunk Assessment Upper Extremity Assessment Upper Extremity Assessment: LUE deficits/detail;RUE deficits/detail RUE Deficits / Details: RUE no active movement noted. No sublux of R shoulder visible LUE Deficits / Details: LUE no active movement noted. No sublux of L shoulder visible   Lower Extremity Assessment RLE Deficits / Details: No active movement or trace of RLE noted. Pt reports sensations of having spasms in BLEs lately. LLE Deficits / Details: No active movement or trace of LLE noted. Pt reports sensations of having spasms in BLEs lately.        Vision Baseline Vision/History: 0 No visual deficits Ability to See in Adequate Light: 0 Adequate Vision Assessment?: No apparent visual deficits   Perception Perception Perception: Not tested   Praxis Praxis Praxis: Not tested    Cognition Arousal/Alertness: Awake/alert Behavior During Therapy: WFL for tasks assessed/performed Overall Cognitive Status: Within Functional Limits for  tasks assessed                                 General Comments: Pt mouths words and produces low vocals of speech        Exercises General Exercises - Upper Extremity Shoulder Flexion: PROM, 10 reps, Both (10 sec holds) Shoulder Horizontal ABduction: PROM, 10 reps, Both (5 sec holds) Elbow Flexion: PROM, 10 reps, Both Elbow Extension: PROM, Both, 10 reps Wrist Flexion: PROM, 10 reps, Both Wrist Extension: PROM, 10 reps, Both General Exercises - Lower Extremity Heel Slides: PROM, 5 reps, Both (10 sec holds) Straight Leg Raises: PROM, Both, 10 reps (10 sec  holds) Toe Raises: PROM, 10 reps, Both Heel Raises: PROM, 10 reps, Both  All PROM performed with 10 sec holds unless indicated otherwise   Shoulder Instructions       General Comments      Pertinent Vitals/ Pain       Pain Assessment Pain Assessment: No/denies pain Pain Score: 0-No pain  Home Living                                          Prior Functioning/Environment              Frequency  Min 2X/week        Progress Toward Goals  OT Goals(current goals can now be found in the care plan section)  Progress towards OT goals: Not progressing toward goals - comment (Pt as of yet has not had any return to ROM but states increasing spasms to BLEs)  Acute Rehab OT Goals Patient Stated Goal: To go home OT Goal Formulation: With patient Time For Goal Achievement: 04/20/22 Potential to Achieve Goals: Poor  Plan Discharge plan remains appropriate;Frequency remains appropriate    Co-evaluation          OT goals addressed during session: Strengthening/ROM      AM-PAC OT "6 Clicks" Daily Activity     Outcome Measure   Help from another person eating meals?: Total Help from another person taking care of personal grooming?: Total Help from another person toileting, which includes using toliet, bedpan, or urinal?: Total Help from another person bathing (including washing, rinsing, drying)?: Total Help from another person to put on and taking off regular upper body clothing?: Total Help from another person to put on and taking off regular lower body clothing?: Total 6 Click Score: 6    End of Session    OT Visit Diagnosis: Other symptoms and signs involving the nervous system (R29.898)   Activity Tolerance Patient tolerated treatment well   Patient Left in bed;with call bell/phone within reach;with family/visitor present   Nurse Communication Other (comment) (Pt needs assist with feeding and grooming)        Time: 1340-1406 OT Time  Calculation (min): 26 min  Charges: OT General Charges $OT Visit: 1 Visit OT Treatments $Therapeutic Exercise: 23-37 mins  Elnoria Howard, OT  Cori Razor 04/08/2022, 2:29 PM

## 2022-04-09 DIAGNOSIS — I6389 Other cerebral infarction: Secondary | ICD-10-CM | POA: Diagnosis not present

## 2022-04-09 DIAGNOSIS — F329 Major depressive disorder, single episode, unspecified: Secondary | ICD-10-CM | POA: Diagnosis not present

## 2022-04-09 DIAGNOSIS — G8250 Quadriplegia, unspecified: Secondary | ICD-10-CM | POA: Diagnosis not present

## 2022-04-09 DIAGNOSIS — Z7189 Other specified counseling: Secondary | ICD-10-CM | POA: Diagnosis not present

## 2022-04-09 DIAGNOSIS — N319 Neuromuscular dysfunction of bladder, unspecified: Secondary | ICD-10-CM

## 2022-04-09 DIAGNOSIS — K592 Neurogenic bowel, not elsewhere classified: Secondary | ICD-10-CM

## 2022-04-09 DIAGNOSIS — Z515 Encounter for palliative care: Secondary | ICD-10-CM | POA: Diagnosis not present

## 2022-04-09 DIAGNOSIS — J9602 Acute respiratory failure with hypercapnia: Secondary | ICD-10-CM | POA: Diagnosis not present

## 2022-04-09 DIAGNOSIS — J9601 Acute respiratory failure with hypoxia: Secondary | ICD-10-CM | POA: Diagnosis not present

## 2022-04-09 DIAGNOSIS — G9511 Acute infarction of spinal cord (embolic) (nonembolic): Secondary | ICD-10-CM | POA: Diagnosis not present

## 2022-04-09 LAB — BASIC METABOLIC PANEL
Anion gap: 8 (ref 5–15)
BUN: 23 mg/dL — ABNORMAL HIGH (ref 6–20)
CO2: 25 mmol/L (ref 22–32)
Calcium: 8.6 mg/dL — ABNORMAL LOW (ref 8.9–10.3)
Chloride: 98 mmol/L (ref 98–111)
Creatinine, Ser: 0.72 mg/dL (ref 0.44–1.00)
GFR, Estimated: >60 mL/min (ref >60)
Glucose, Bld: 119 mg/dL — ABNORMAL HIGH (ref 70–99)
Potassium: 3.7 mmol/L (ref 3.5–5.1)
Sodium: 131 mmol/L — ABNORMAL LOW (ref 135–145)

## 2022-04-09 LAB — CBC
HCT: 27 % — ABNORMAL LOW (ref 36.0–46.0)
Hemoglobin: 8.6 g/dL — ABNORMAL LOW (ref 12.0–15.0)
MCH: 27.8 pg (ref 26.0–34.0)
MCHC: 31.9 g/dL (ref 30.0–36.0)
MCV: 87.4 fL (ref 80.0–100.0)
Platelets: 299 10*3/uL (ref 150–400)
RBC: 3.09 MIL/uL — ABNORMAL LOW (ref 3.87–5.11)
RDW: 14.6 % (ref 11.5–15.5)
WBC: 15.8 10*3/uL — ABNORMAL HIGH (ref 4.0–10.5)
nRBC: 0 % (ref 0.0–0.2)

## 2022-04-09 LAB — GLUCOSE, CAPILLARY
Glucose-Capillary: 113 mg/dL — ABNORMAL HIGH (ref 70–99)
Glucose-Capillary: 121 mg/dL — ABNORMAL HIGH (ref 70–99)
Glucose-Capillary: 133 mg/dL — ABNORMAL HIGH (ref 70–99)
Glucose-Capillary: 138 mg/dL — ABNORMAL HIGH (ref 70–99)
Glucose-Capillary: 150 mg/dL — ABNORMAL HIGH (ref 70–99)
Glucose-Capillary: 155 mg/dL — ABNORMAL HIGH (ref 70–99)

## 2022-04-09 MED ORDER — ENOXAPARIN SODIUM 30 MG/0.3ML IJ SOSY
30.0000 mg | PREFILLED_SYRINGE | Freq: Two times a day (BID) | INTRAMUSCULAR | Status: DC
  Administered 2022-04-09 – 2022-04-15 (×12): 30 mg via SUBCUTANEOUS
  Filled 2022-04-09 (×13): qty 0.3

## 2022-04-09 MED ORDER — POTASSIUM CHLORIDE 20 MEQ PO PACK
40.0000 meq | PACK | Freq: Once | ORAL | Status: AC
  Administered 2022-04-09: 40 meq
  Filled 2022-04-09: qty 2

## 2022-04-09 MED ORDER — BACLOFEN 5 MG HALF TABLET
5.0000 mg | ORAL_TABLET | Freq: Two times a day (BID) | ORAL | Status: DC
  Administered 2022-04-09: 5 mg via ORAL
  Filled 2022-04-09 (×2): qty 1

## 2022-04-09 MED ORDER — BACLOFEN 5 MG HALF TABLET
5.0000 mg | ORAL_TABLET | Freq: Two times a day (BID) | ORAL | Status: DC
  Administered 2022-04-09 – 2022-04-18 (×17): 5 mg
  Filled 2022-04-09 (×22): qty 1

## 2022-04-09 MED ORDER — DIPHENOXYLATE-ATROPINE 2.5-0.025 MG PO TABS
1.0000 | ORAL_TABLET | Freq: Four times a day (QID) | ORAL | Status: DC | PRN
  Administered 2022-04-09 – 2022-04-11 (×6): 2
  Administered 2022-04-11: 1
  Administered 2022-04-15 – 2022-04-17 (×5): 2
  Filled 2022-04-09: qty 1
  Filled 2022-04-09 (×11): qty 2

## 2022-04-09 MED ORDER — SERTRALINE HCL 100 MG PO TABS
100.0000 mg | ORAL_TABLET | Freq: Every day | ORAL | Status: DC
  Administered 2022-04-10 – 2022-04-17 (×7): 100 mg
  Filled 2022-04-09 (×8): qty 1

## 2022-04-09 MED ORDER — ORAL CARE MOUTH RINSE
15.0000 mL | OROMUCOSAL | Status: DC
  Administered 2022-04-09 – 2022-04-18 (×69): 15 mL via OROMUCOSAL

## 2022-04-09 MED ORDER — ORAL CARE MOUTH RINSE: 15.0000 mL | OROMUCOSAL | Status: DC | PRN

## 2022-04-09 NOTE — Progress Notes (Signed)
Palliative Medicine Inpatient Follow Up Note HPI: 22 y.o. female  with past medical history of depression and suicidal ideation who presented to [facility] ED with sudden onset of shortness of breath and upper and lower extremity weakness. ED work-up found loss of speaking ability and hypercarbic respiratory failure and was subsequently intubated and transferred to [facility].  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    PMT was consulted for [place] conversations.  Today's Discussion 04/09/2022  *Please note that this is a verbal dictation therefore any spelling or grammatical errors are due to the "[place] One" system interpretation.  Chart reviewed inclusive of vital signs, progress notes, laboratory results, and diagnostic images.   I met at bedside this afternoon with Rachael [name]. She was joined by her mother, Rachael [name]. We reviewed that Rachael [name] has had a terribly difficult three weeks. I shared the knowledge that her life has changed dramatically over this period of time. Created space and opportunity for patient to explore thoughts feelings and fears regarding current medical situation.   Rachael [name] and I reviewed the recommendations of the rehab medicine team.   We discussed things which bring Rachael [name] joy, one of which being her daughter. She and her mother were able to show me pictures of her. This unfortunately turned to sadness as Rachael [name] vocalized over and over again the inability to care for and/or life her daughter.   We reviewed distressing symptoms which as this time appear to be patients persistently loose bowel movements. We discussed that she had had changes in her tube feeding formula and repositioning of her rectal tube but nothing has worked to date. Patients RN is reaching  out to the wound/ostomy team to see if they can recommend anything on their end.  Patients mother, Rachael [name] and I were able to talk as well about Rachael [name]'s fears for the future. She acknowledges that [facility] does not desire to be in the [place] long term. She is interested in learning more about support groups. I shared that I would do some research in an effort to further support this. She also wants some resources for [facility]. When I mentioned this to her she was tearful that it would not make a difference.  Patients goals are to be functional again. I shared that function-ability may look different though there are potential possibilities.Rachael [name] expresses feeling hopeless.  We discussed PMT ongoing involvement which she is open towards as an additional supportive layer.   Questions and concerns addressed/Palliative Support Provided.   Objective Assessment: Vital Signs Vitals:   04/09/22 1126 04/09/22 1132  BP:    Pulse:  90  Resp:  (!) 22  Temp:  99.5 F (37.5 C)  SpO2: 100% 100%    Intake/Output Summary (Last 24 hours) at 04/09/2022 1353 Last data filed at 04/09/2022 1300 Gross per 24 hour  Intake 1588.68 ml  Output 2630 ml  Net -1041.32 ml   Last Weight  Most recent update: 04/09/2022  6:17 AM    Weight  90 kg (198 lb 6.6 oz)            Gen:  Young AA F in NAD HEENT: Tracheostomy, moist mucous membranes CV: Regular rate and rhythm  PULM:  On Vent ABD: soft/nontender  EXT: No edema  Neuro: Alert and oriented x3   SUMMARY OF RECOMMENDATIONS   DNAR  Appreciate  involvement of chaplain team for support  Continue to allow time for outcomes   Will identify support group resources  Ongoing support  Time Spent: 15 Billing based on MDM: High  ______________________________________________________________________________________ [place] Team Team Cell Phone: [phone] Please utilize secure chat with additional questions, if  there is no response within 30 minutes please call the above phone number  Palliative Medicine Team providers are available by phone from 7am to 7pm daily and can be reached through the team cell phone.  Should this patient require assistance outside of these hours, please call the patient's attending physician.

## 2022-04-09 NOTE — Progress Notes (Signed)
NAME:  Rachael [name], MRN:  [id number], DOB:  2001-01-03, LOS: 21 ADMISSION DATE:  03/19/2022, CONSULTATION DATE:  04/09/22 REFERRING MD:  drawbridge, CHIEF COMPLAINT:  shortness of breath and weakness   History of Present Illness:  Rachael [name] is a 22 y.o. F with history of depression and suicidal ideation who presented to [place] ED with sudden onset of shortness of breath and upper and lower extremity weakness.  History taken from Epic as patient is intubated and no family is at the bedside.  She initially had a headache and back pain prior to onset but denied abdominal pain or chest pain.  Vitals showed an initial temp of 52F, bloodwork with WBC 18k, UDS + for bezos and THC.   Her symptoms progressed to the point she wasn't speaking and ABG showed hypercarbic respiratory failure so pt was intubated.  She had some bradycardia and hypotension post-intubation, so was started on peripheral Levophed and given atropine during transport   Pertinent  Medical History   has a past medical history of Depression, Hyperlipidemia, and Kidney abscess.   Significant Hospital Events: Including procedures, antibiotic start and stop dates in addition to other pertinent events   2/16 presented to [facility] with all weakness and shortness of breath requiring intubation and transfer to [facility] 2/18 episodes of desaturations and hypercarbia unexplained 2/19 trach, cortrak, PICC 2/23 therapeutic bronch 2/24 Eikenella corrodens BAL. Not triggering vent  2/25 narrow abx from vanc zosyn to rocephin. Will reach out to OSH re transfer and consideration of diaphragmatic pacemaker - unfortunately no beds at [facility] and declined transfer at [facility] 2/26 fevers, abx broadened> vanc/ merrem, diuresing 2/27 chest CT, MRI brain, ongoing fevers, did not tolerate/ like PMV 2/28 trach aspirate resent as 2/26 neg.  Continued fevers.  Episode of vomiting after large bolus feeding.  Remains on 70% 2/29 ongoing fevers, working with SLP  for PMV 3/3 jump in scr, ?bladder outlet obstruction, foley replaced  3/4 creatinine has improved. When respiratory rate dropped, patient not able to initiate breath on her own.  3/7 creatinine has improved. Complaining of headache. Still having diarrhea.    Interim History / Subjective:  Seen by PMR for assistance with management.   Objective   Blood pressure (!) 96/49, pulse 82, temperature 99.5 F (37.5 C), resp. rate (!) 22, height '5\' 7"'$  (1.702 m), weight 90 kg, SpO2 100 %.    Vent Mode: PRVC FiO2 (%):  [40 %] 40 % Set Rate:  [22 bmp] 22 bmp Vt Set:  [490 mL] 490 mL PEEP:  [5 cmH20] 5 cmH20 Plateau Pressure:  [17 cmH20-23 cmH20] 17 cmH20   Intake/Output Summary (Last 24 hours) at 04/09/2022 1900 Last data filed at 04/09/2022 1800 Gross per 24 hour  Intake 1422.68 ml  Output 3010 ml  Net -1587.32 ml    Filed Weights   04/06/22 0437 04/07/22 0413 04/09/22 0500  Weight: 99 kg 98.7 kg 90 kg   General:  young fm, trach in place on vent  HEENT: NCAT, trach in place. Tenderness to palpation posterior occipital muscles.  Neuro: no movement of UE LE, communicating by mouthing words.  CV: RRR, s1 s2  PULM: equal air entry. No spontaneous triggering but patient set on 20/min with a minute ventilation of 9+L/min GI: soft nt nd  Extremities: dependent edema     Ancillary tests personally reviewed  VBG on RR of 14 with MV 6.6 l/min show pCO2 61.  Creatinine 0.75,  Assessment & Plan:   [place] /  DNR status -clear GOC -- aggressive care/rehab x63mo If Qol not acceptable at that point, transition to comfort care. Mom and medical team are supportive, and will continue to help facilitate best shot at recovery during this period of time.  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  - I suspect life long vent requirement and will need LTACH placement as patient is not able to respond to hypercarbic stimulus to trigger breath even with Flow Trigger set at 0.  Will not attempt weaning.  - Cough Assist device.  - Periodic I/O catheterization to manage neurogenic bladder would be better than Foley but patient is resistent - Complete course of Meropenem per ID. 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  - Continue current anti-anxiety regimen.  - Add low dose midodrine to help with postural hypotension.  - Ketorolac and ibuprofen for tension type headache.  - Increase fiber and stop all liquid medications.   RKipp Brood MD [facility] Phys[facility] [place] Pager: [phone]: [phone] hours: [phone].

## 2022-04-09 NOTE — TOC Progression Note (Signed)
Transition of Care [facility]) - Progression Note    Patient Details  Name: Rachael [name] MRN: [id number] Date of Birth: 08-17-2000  Transition of Care [facility]) CM/SW [place], [place] Phone Number: 04/09/2022, 4:17 PM  Clinical Narrative:     CSW followed up with patients mother [name] regarding vent/SNF placement. CSW informed [name] CSW following patient to fax out patient for vent/snf once trach is 71 days old. All questions answered. No further questions reported at this time. CSW will continue to follow and assist with patients dc planning needs.   Expected Discharge Plan: [place] Barriers to Discharge: Continued Medical Work up  Expected Discharge Plan and Services In-house Referral: Clinical Social Work Discharge Planning Services: CM Consult   Living arrangements for the past 2 months: Single Family Home                                       Social Determinants of Health (SDOH) Interventions SDOH Screenings   Food Insecurity: No Food Insecurity (03/23/2022)  Housing: Low Risk  (03/23/2022)  Transportation Needs: No Transportation Needs (03/23/2022)  Utilities: Not At Risk (03/23/2022)  Tobacco Use: Medium Risk (03/26/2022)    Readmission Risk Interventions     No data to display

## 2022-04-09 NOTE — Progress Notes (Signed)
Speech Language Pathology Treatment: Dysphagia  Patient Details Name: Rachael [name] MRN: [id number] DOB: [birth year] Today's Date: 04/09/2022 Time: 1010-1020 SLP Time Calculation (min) (ACUTE ONLY): 10 min  Assessment / Plan / Recommendation Clinical Impression  Visited Pt and mom at bedside. They report Rachael [name] has a cuff leak currently (unintentional) and are waiting on RT to further inflate cuff because she is uncomfortable. SLP offered working on increased voicing with cuff down and changes to vent but pt reports she is not interested in working on that. SLP asked what goals she has and mother and pt report they want to expand PO intake for her comfort. SLP reviewed FEES findings and recalled and reiterated to pt and mom that likely Rachael [name] will have some trouble swallowing thicker liquids or purees because her upper esophagus was not fully opening and those textures backed up into her pharynx. However, that being said, SLP is very willing to trial those textures and assist in tolerance if they understand that it could result in discomfort or further illness. It is hard to know what Rachael [name] can be successful with unless it is attempted. They agreed to discuss further and SLP will f/u to update therapeutic goals as appropriate.   HPI HPI: Rachael [name] is a 22 y.o. female with PMH significant for depression, HLD, Kidney abscess who presents with acute arms and leg weakness, dyspnea and hypercarbia.  Pt was intubated on 2/16; trach 2/19; PEG 2/23. FEES 3/1: sips of water with f/u dry swallows to reduce pooling. High risk of aspiration. 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      SLP Plan   F/u for trials of comfort PO - likely icees, pudding or ice cream per pts wishes.       Recommendations for follow up therapy are one component of a multi-disciplinary discharge planning process, led by the attending  physician.  Recommendations may be updated based on patient status, additional functional criteria and insurance authorization.    Recommendations                               Rachael [name], Rachael [name]  04/09/2022, 12:51 PM

## 2022-04-09 NOTE — Progress Notes (Signed)
Physical Therapy Treatment Patient Details Name: Rachael [name] MRN: [id number] DOB: [birth year] Today's Date: 04/09/2022   History of Present Illness Rachael [name] is a 22 y.o. female admitted 03/19/22 with acute arms and leg weakness, dyspnea and hypercabia.  (+) for polysubstances. Pt was intubated on 2/16. 2/19 trach, cortrak, PICC.  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 Pt with HOB 30 degrees on arrival and pt tolerating HOB to 35 degrees only for 2 min then returned to 30 degrees due to nausea. Pt educated for need to progress to sitting tolerance for increased functional mobility and transfers to [facility] however pt denied stating she dislikes everything about the chair/chair position. Pt agreeable to PROM all extremities and reports family or nursing performing daily. Decreased frequency to 1x/wk as pt not tolerating increased HOB and chair position to progress functional mobility, pt and mom aware and understanding.   Vent PRVC 40%, SpO2 99%, HR 93     Recommendations for follow up therapy are one component of a multi-disciplinary discharge planning process, led by the attending physician.  Recommendations may be updated based on patient status, additional functional criteria and insurance authorization.  Follow Up Recommendations  Skilled nursing-short term rehab (<3 hours/day) Can patient physically be transported by private vehicle: No   Assistance Recommended at Discharge Frequent or constant Supervision/Assistance  Patient can return home with the following Two people to help with walking and/or transfers;Two people to help with bathing/dressing/bathroom;Assistance with cooking/housework;Assistance with feeding;Direct supervision/assist for  medications management;Direct supervision/assist for financial management;Assist for transportation;Help with stairs or ramp for entrance   Equipment Recommendations  Hospital bed;Wheelchair (measurements PT);Wheelchair cushion (measurements PT);Other (comment) (hoyer)    Recommendations for Other Services       Precautions / Restrictions Precautions Precautions: Fall;Other (comment) Precaution Comments: bed bound, vent, trach, flexiseal, peg     Mobility  Bed Mobility               General bed mobility comments: attempted to elevate HOB and pt only allowed transition from 30-35 degrees grossly 2 min limited by nausea and returned to 30 degrees. Pt denied all options of chair position and progressing tolerance stating she hates everything about chair position and chair because its uncomfortable    Transfers                        Ambulation/Gait                   Stairs             Wheelchair Mobility    Modified Rankin (Stroke Patients Only)       Balance                                            Cognition Arousal/Alertness: Awake/alert Behavior During Therapy: WFL for tasks assessed/performed Overall Cognitive Status: Within Functional Limits for tasks assessed                                 General Comments: Pt mouths words appropriately  Exercises General Exercises - Upper Extremity Shoulder Flexion: PROM, 10 reps, Both Shoulder ABduction: PROM, Both, 10 reps, Supine Elbow Flexion: PROM, 10 reps, Both Elbow Extension: PROM, Both, 10 reps Wrist Flexion: PROM, 10 reps, Both Wrist Extension: PROM, 10 reps, Both General Exercises - Lower Extremity Ankle Circles/Pumps: PROM, Both, 10 reps, Supine Heel Slides: PROM, Both, 10 reps, Supine Hip ABduction/ADduction: PROM, Both, 10 reps, Supine Hip Flexion/Marching: PROM, Both, Supine, 10 reps Other Exercises Other Exercises: bil hip  internal/external rotation PROM x 10 supine    General Comments        Pertinent Vitals/Pain Pain Assessment Pain Assessment: No/denies pain    Home Living                          Prior Function            PT Goals (current goals can now be found in the care plan section) Acute Rehab PT Goals Potential to Achieve Goals: Poor Progress towards PT goals: Not progressing toward goals - comment    Frequency    Min 1X/week      PT Plan Current plan remains appropriate;Frequency needs to be updated    Co-evaluation              AM-PAC PT "6 Clicks" Mobility   Outcome Measure  Help needed turning from your back to your side while in a flat bed without using bedrails?: Total Help needed moving from lying on your back to sitting on the side of a flat bed without using bedrails?: Total Help needed moving to and from a bed to a chair (including a wheelchair)?: Total Help needed standing up from a chair using your arms (e.g., wheelchair or bedside chair)?: Total Help needed to walk in hospital room?: Total Help needed climbing 3-5 steps with a railing? : Total 6 Click Score: 6    End of Session   Activity Tolerance: Patient limited by lethargy Patient left: in bed;with call bell/phone within reach;with family/visitor present Nurse Communication: Mobility status;Need for lift equipment PT Visit Diagnosis: Muscle weakness (generalized) (M62.81);Other symptoms and signs involving the nervous system (R29.898)     Time: [id number] PT Time Calculation (min) (ACUTE ONLY): 19 min  Charges:  $Therapeutic Exercise: 8-22 mins                     Bayard Males, PT Acute Rehabilitation Services Office: [phone]    [name] 04/09/2022, 12:54 PM

## 2022-04-09 NOTE — Progress Notes (Addendum)
Pt refused CPT at this time.

## 2022-04-09 NOTE — Consult Note (Addendum)
Physical Medicine and Rehabilitation Consult Reason for Consult: cervical spinal cord infarct, associated complications Referring Physician: Agarwala   HPI: Rachael [name] is a 22 y.o. female with a history of anxiety/depression who presented on 03/19/22 with sudden onset of shortness of breath and upper/lower extremity weakness. Initial work up notable for leukocytosis of 18K, urine + for benzos and THC. Weakness progressed and pt developed profound respiratory distress. She was ultimately intubated for hypercarbic respiratory failure.  MRI of the brain did not demonstrate any acute processes. MRI of the cervical spine demonstrated restricted diffusion and increased T2 signal along the anterior aspect of the spinal cord from the craniocervical junction to the level of C7 consistent with anterior spinal artery cord distribution ischemia.  Patient was placed on subcu heparin for DVT prophylaxis as well as 81 mg of aspirin daily and Crestor.  Tracheostomy was placed on 03/22/2022.  TEE was unremarkable.  Hypercoagulable workup was generally negative as well as autoimmune workup and infectious workup.  Patient had G-tube placed by interventional radiology on 03/26/2022.  Patient has had persistent fevers likely due to Eikenella corrodens HCAP requiring vanc/meropenem.  She had a therapeutic bronchoscopy on 03/26/2022 as well.  Patient has not demonstrated any spontaneous breathing.  She has not tolerated PS or SIMV and she has required full ventilator support. Expectation is that she will require LTV Diaphragmatic pacemaker has been discussed. Palliative care has been involved to establish goals of care.  She has struggled emotionally given the severity of her diagnosis.  She is now DNR but has indicated that she would like to pursue aggressive care for a few months to see if any meaningful improvement takes place in her function and quality of life.. Fees was performed on 04/02/22 by SLP. She did not appear  to aspirate thin liquids with trach cuff partially deflated, however it was felt she would remain high risk for pooling and aspiration with cuff inflated or deflated. SLP recommended water sips of water only give it would carry the least risk. Pt has been having issues with diarrhea felt to be secondary to TF/boluses. She was changed to [place] over 12 hours and remains on banatrol. Recommendation was to stop liquids medications as well.   Therapy has been working with patient as well. She has frequently refused treatments but is total assist for any movement or functional activity. Therapy did note yesterday that patient was reporting spasms in her lower extremities.   Review of Systems  Constitutional:  Positive for malaise/fatigue.  HENT:  Positive for sore throat.   Eyes: Negative.   Respiratory:  Positive for cough and shortness of breath.   Cardiovascular: Negative.   Gastrointestinal:  Positive for diarrhea and nausea.  Genitourinary:        Incontinent  Musculoskeletal:        Leg spasms  Skin: Negative.   Neurological:  Positive for sensory change, speech change, focal weakness, weakness and headaches. Negative for dizziness.  Endo/Heme/Allergies:  Does not bruise/bleed easily.  Psychiatric/Behavioral:  Positive for depression. The patient is nervous/anxious.    Past Medical History:  Diagnosis Date   Depression    few yrs ago [facility]), doing good now   Hyperlipidemia    Kidney abscess    Past Surgical History:  Procedure Laterality Date   IR GASTROSTOMY TUBE MOD SED  03/26/2022   NO PAST SURGERIES     Family History  Problem Relation Age of Onset   Asthma Mother  Hypertension Other    Hyperlipidemia Maternal Grandmother    Hypertension Maternal Grandmother    Diabetes Paternal Grandmother    Social History:  reports that she quit smoking about 3 years ago. Her smoking use included cigarettes. She has a 0.99 pack-year smoking history. She has been exposed to tobacco  smoke. She has never used smokeless tobacco. She reports current drug use. Frequency: 7.00 times per week. Drug: Marijuana. She reports that she does not drink alcohol. Allergies: No Known Allergies Medications Prior to Admission  Medication Sig Dispense Refill   cetirizine (ZYRTEC ALLERGY) 10 MG tablet Take 1 tablet (10 mg total) by mouth daily. (Patient not taking: Reported on 03/20/2022) 30 tablet 0   predniSONE (DELTASONE) 20 MG tablet Take 2 tablets daily with breakfast. (Patient not taking: Reported on 03/20/2022) 10 tablet 0   Prenat-FeCbn-FeAsp-Meth-FA-DHA (PRENATE MINI) 18-0.6-0.4-350 MG CAPS Take 1 tablet by mouth daily. (Patient not taking: Reported on 03/20/2022) 30 capsule 10   promethazine-dextromethorphan (PROMETHAZINE-DM) 6.25-15 MG/5ML syrup Take 2.5 mLs by mouth 3 (three) times daily as needed for cough. (Patient not taking: Reported on 03/20/2022) 100 mL 0    Home: Home Living Family/patient expects to be discharged to:: Unsure Living Arrangements: Parent, Children (2 siblings 3rd and 5th grade, her own daughter is 2 y/o) Available Help at Discharge: Family Type of Home: Apartment Home Access: Other (comment) (ledge/threshold) Home Layout: 1/2 bath on main level, Bed/bath upstairs Bathroom Shower/Tub: Chiropodist: Standard Bathroom Accessibility: Yes Home Equipment: Hand held shower head Additional Comments: enjoys R&B, rap  Lives With: Family  Functional History: Prior Function Prior Level of Function : Independent/Modified Independent Functional Status:  Mobility: Bed Mobility Overal bed mobility: Needs Assistance Bed Mobility: Rolling Rolling: Total assist, +2 for physical assistance, +2 for safety/equipment General bed mobility comments: elevated HOB to 46 deg for 5 min and then 56 deg for 7 min. Pt declined going further to 60 deg. Pt without nystagmus movement in her eyes this date. Pt tolerated HOB >46 degrees for total of 12 minutes this  date and return to 45 deg and reported feeling well with HOB at 45 deg when PT left. RN notified and aware Transfers General transfer comment: did not attempt to elevate head anymore due to flexiseal coming out      ADL: ADL Overall ADL's : Needs assistance/impaired General ADL Comments: total A for all aspects of ADL  Cognition: Cognition Overall Cognitive Status: Within Functional Limits for tasks assessed Arousal/Alertness: Awake/alert Orientation Level: Oriented X4 Year: 2024 Attention: Focused Focused Attention: Appears intact Cognition Arousal/Alertness: Awake/alert Behavior During Therapy: [facility] for tasks assessed/performed Overall Cognitive Status: Within Functional Limits for tasks assessed General Comments: Pt mouths words and produces low vocals of speech Difficult to assess due to: Tracheostomy  Blood pressure 116/69, pulse 89, temperature (!) 102 F (38.9 C), temperature source Oral, resp. rate (!) 22, height '5\' 7"'$  (1.702 m), weight 90 kg, SpO2 98 %. Physical Exam Constitutional:      Appearance: She is obese.  HENT:     Head: Normocephalic and atraumatic.     Nose: Nose normal.     Mouth/Throat:     Mouth: Mucous membranes are moist.  Eyes:     Extraocular Movements: Extraocular movements intact.     Conjunctiva/sclera: Conjunctivae normal.     Pupils: Pupils are equal, round, and reactive to light.  Neck:     Comments: #6 shiley cuffed Cardiovascular:     Rate and Rhythm: Normal rate.  Pulses: Normal pulses.  Pulmonary:     Comments: Paced on vent Abdominal:     General: Bowel sounds are normal.     Comments: PEG  Genitourinary:    Comments: flexiseal Musculoskeletal:        General: No swelling or tenderness.  Skin:    General: Skin is warm.     Comments: tattoos  Neurological:     Mental Status: She is alert.     Comments: Pt alert, oriented. Reasonable insight and awareness. Able to mouth words, has intact comprehension and language.  Functional memory. Motor 0/5 bilateral UE and LE's. Able to discern gross touch and pain in all 4 limbs but sensation overall is altered. No resting hypertonicity in the UE's, DTR's absent. LE's with 4-5 beats of clonus bilaterally and DTR's 1+ to 2+. No resting hypertonicity appreciated.   Psychiatric:     Comments: Generally pleasant, sl anxious, cooperative     Results for orders placed or performed during the hospital encounter of 03/19/22 (from the past 24 hour(s))  Glucose, capillary     Status: Abnormal   Collection Time: 04/08/22 11:31 AM  Result Value Ref Range   Glucose-Capillary 162 (H) 70 - 99 mg/dL  Glucose, capillary     Status: Abnormal   Collection Time: 04/08/22  3:34 PM  Result Value Ref Range   Glucose-Capillary 170 (H) 70 - 99 mg/dL  Glucose, capillary     Status: Abnormal   Collection Time: 04/08/22  7:55 PM  Result Value Ref Range   Glucose-Capillary 166 (H) 70 - 99 mg/dL  Glucose, capillary     Status: Abnormal   Collection Time: 04/08/22 11:09 PM  Result Value Ref Range   Glucose-Capillary 138 (H) 70 - 99 mg/dL  CBC     Status: Abnormal   Collection Time: 04/09/22  4:16 AM  Result Value Ref Range   WBC 15.8 (H) 4.0 - 10.5 K/uL   RBC 3.09 (L) 3.87 - 5.11 MIL/uL   Hemoglobin 8.6 (L) 12.0 - 15.0 g/dL   HCT 27.0 (L) 36.0 - 46.0 %   MCV 87.4 80.0 - 100.0 fL   MCH 27.8 26.0 - 34.0 pg   MCHC 31.9 30.0 - 36.0 g/dL   RDW 14.6 11.5 - 15.5 %   Platelets 299 150 - 400 K/uL   nRBC 0.0 0.0 - 0.2 %  Basic metabolic panel     Status: Abnormal   Collection Time: 04/09/22  4:16 AM  Result Value Ref Range   Sodium 131 (L) 135 - 145 mmol/L   Potassium 3.7 3.5 - 5.1 mmol/L   Chloride 98 98 - 111 mmol/L   CO2 25 22 - 32 mmol/L   Glucose, Bld 119 (H) 70 - 99 mg/dL   BUN 23 (H) 6 - 20 mg/dL   Creatinine, Ser 0.72 0.44 - 1.00 mg/dL   Calcium 8.6 (L) 8.9 - 10.3 mg/dL   GFR, Estimated >60 >60 mL/min   Anion gap 8 5 - 15  Glucose, capillary     Status: Abnormal    Collection Time: 04/09/22  4:43 AM  Result Value Ref Range   Glucose-Capillary 113 (H) 70 - 99 mg/dL  Glucose, capillary     Status: Abnormal   Collection Time: 04/09/22  7:37 AM  Result Value Ref Range   Glucose-Capillary 121 (H) 70 - 99 mg/dL   No results found.  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 Dopplers negative on 2/18--I placed a new order today Patient is interested in and would be a candidate for a diaphragmatic pacer, most likely a direct diaphragmatic pacing system given her diffuse cervical cord injury. Continue PRAFO's 4 hours on/4 hours off to stretch heel cords. Also helps with spasticity control Begin trial of baclofen for spasms in LE's. Her LE tone is likely to increase over the next few weeks, and she's already experiencing discomfort from spasms.--I placed an order for '5mg'$  bid to start Fever is very likely related to her cord injury especially since we're seeing it persist despite treatment of her infectious issues.  Agree with midodrine for BP support in short term especially as we're trying to get her a little more upright.  I would expect her stools to improve with the change in formula, banatrol, fiber, and other measures recently taken including limiting liquid medications For the short term, I would stay with her foley catheter given the loose stool and the emotional issues she's going through. I don't think she could handle regular I/O caths at this point even though it's appropriate. Recommend increasing zoloft to '100mg'$  daily. Pt would like to increase it as well to see if it helps her mood.--I placed an order today -she  goes through ups and downs from an emotional standpoint but appears that she wants to keep fighting for now. Ketorolac and ibuprofen are fine for headaches. Pt would prefer using bed for chest PT as opposed to vest. She feels that vest is smothering/constricting which increases her anxiety.  Spoke with SLP today. Pt is receiving limited sips of water per SLP recs. She and mom both understand the risks of swallowing other types of liquids or consistencies. I think they would at least like to try on a trial basis to see how she tolerates. SLP will be following back up with them in this regard Monday.     I will continue to follow along with you.    I have personally performed a face to face diagnostic evaluation of this patient. Additionally, I have examined the patient's medical record including any pertinent labs and radiographic images. If the physician assistant has documented in this note, I have reviewed and edited or otherwise concur with the physician assistant's documentation.  Thanks,  Meredith Staggers, MD 04/09/2022   At least 90 total minutes were spent in examination of patient, assessment of pertinent data,  formulation of a treatment plan, and in discussion with patient, family and/or consulting providers.

## 2022-04-10 ENCOUNTER — Inpatient Hospital Stay (HOSPITAL_COMMUNITY): Payer: Medicaid Other

## 2022-04-10 DIAGNOSIS — Z7189 Other specified counseling: Secondary | ICD-10-CM | POA: Diagnosis not present

## 2022-04-10 DIAGNOSIS — G8250 Quadriplegia, unspecified: Secondary | ICD-10-CM | POA: Diagnosis not present

## 2022-04-10 DIAGNOSIS — J9601 Acute respiratory failure with hypoxia: Secondary | ICD-10-CM | POA: Diagnosis not present

## 2022-04-10 DIAGNOSIS — Z515 Encounter for palliative care: Secondary | ICD-10-CM | POA: Diagnosis not present

## 2022-04-10 DIAGNOSIS — I6389 Other cerebral infarction: Secondary | ICD-10-CM | POA: Diagnosis not present

## 2022-04-10 DIAGNOSIS — J9602 Acute respiratory failure with hypercapnia: Secondary | ICD-10-CM | POA: Diagnosis not present

## 2022-04-10 LAB — GLUCOSE, CAPILLARY
Glucose-Capillary: 121 mg/dL — ABNORMAL HIGH (ref 70–99)
Glucose-Capillary: 157 mg/dL — ABNORMAL HIGH (ref 70–99)
Glucose-Capillary: 145 mg/dL — ABNORMAL HIGH (ref 70–99)
Glucose-Capillary: 136 mg/dL — ABNORMAL HIGH (ref 70–99)

## 2022-04-10 MED ORDER — HYDROXYZINE HCL 10 MG/5ML PO SYRP: 10.0000 mg | ORAL_SOLUTION | Freq: Three times a day (TID) | ORAL | Status: DC | PRN

## 2022-04-10 MED ORDER — HYDROXYZINE HCL 10 MG PO TABS
10.0000 mg | ORAL_TABLET | Freq: Three times a day (TID) | ORAL | Status: DC | PRN
  Administered 2022-04-14 – 2022-04-15 (×3): 10 mg
  Filled 2022-04-10 (×6): qty 1

## 2022-04-10 NOTE — Plan of Care (Signed)
  Problem: Elimination: Goal: Will not experience complications related to bowel motility Outcome: Progressing   Problem: Elimination: Goal: Will not experience complications related to urinary retention Outcome: Progressing   Problem: Clinical Measurements: Goal: Respiratory complications will improve Outcome: Not Progressing   Problem: Skin Integrity: Goal: Risk for impaired skin integrity will decrease Outcome: Not Progressing   Problem: Activity: Goal: Ability to tolerate increased activity will improve Outcome: Not Progressing   Problem: Activity: Goal: Ability to tolerate increased activity will improve Outcome: Not Progressing   Diarrhea continues despite addition of lomotil and tube feeding formula change.  Foley catheter continues for neurogenic bladder, pt is disinterested in trialing intermittent catheterization.  Remains ventilator dependent without ability to wean.  Remains bed bound and unable to tolerate upright position changes, refuses most turns.

## 2022-04-10 NOTE — Progress Notes (Signed)
NAME:  Rachael [name], MRN:  [id number], DOB:  14-Oct-2000, LOS: 75 ADMISSION DATE:  03/19/2022, CONSULTATION DATE:  04/10/22 REFERRING MD:  drawbridge, CHIEF COMPLAINT:  shortness of breath and weakness   History of Present Illness:  Rachael [name] is a 22 y.o. F with history of depression and suicidal ideation who presented to [facility] ED with sudden onset of shortness of breath and upper and lower extremity weakness.  History taken from Epic as patient is intubated and no family is at the bedside.  She initially had a headache and back pain prior to onset but denied abdominal pain or chest pain.  Vitals showed an initial temp of 48F, bloodwork with WBC 18k, UDS + for bezos and THC.   Her symptoms progressed to the point she wasn't speaking and ABG showed hypercarbic respiratory failure so pt was intubated.  She had some bradycardia and hypotension post-intubation, so was started on peripheral Levophed and given atropine during transport   Pertinent  Medical History   has a past medical history of Depression, Hyperlipidemia, and Kidney abscess.  Significant Hospital Events: Including procedures, antibiotic start and stop dates in addition to other pertinent events   2/16 presented to DB with all weakness and shortness of breath requiring intubation and transfer to [facility] 2/18 episodes of desaturations and hypercarbia unexplained 2/19 trach, cortrak, PICC 2/23 therapeutic bronch 2/24 Eikenella corrodens BAL. Not triggering vent  2/25 narrow abx from vanc zosyn to rocephin. Will reach out to OSH re transfer and consideration of diaphragmatic pacemaker - unfortunately no beds at [facility] and declined transfer at [facility] 2/26 fevers, abx broadened> vanc/ merrem, diuresing 2/27 chest CT, MRI brain, ongoing fevers, did not tolerate/ like PMV 2/28 trach aspirate resent as 2/26 neg.  Continued fevers.  Episode of vomiting after large bolus feeding.  Remains on 70% 2/29 ongoing fevers, working with SLP for  PMV 3/3 jump in scr, ?bladder outlet obstruction, foley replaced  3/4 creatinine has improved. When respiratory rate dropped, patient not able to initiate breath on her own.  3/7 creatinine has improved. Complaining of headache. Still having diarrhea.    Interim History / Subjective:  No overnight issues Remain afebrile  Objective   Blood pressure (!) 106/55, pulse 82, temperature 99.9 F (37.7 C), temperature source Oral, resp. rate (!) 22, height '5\' 7"'$  (1.702 m), weight 90 kg, SpO2 99 %.    Vent Mode: PRVC FiO2 (%):  [40 %] 40 % Set Rate:  [22 bmp] 22 bmp Vt Set:  [490 mL] 490 mL PEEP:  [5 cmH20] 5 cmH20 Plateau Pressure:  [17 cmH20-23 cmH20] 17 cmH20   Intake/Output Summary (Last 24 hours) at 04/10/2022 0823 Last data filed at 04/10/2022 0500 Gross per 24 hour  Intake 1644 ml  Output 2985 ml  Net -1341 ml   Filed Weights   04/06/22 0437 04/07/22 0413 04/09/22 0500  Weight: 99 kg 98.7 kg 90 kg   Physical exam: General: Young female, lying on the bed HEENT: [place]/AT, eyes anicteric.  moist mucus membranes, s/p trach Neuro: Alert, awake following commands.  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 Continue aspirin and Plavix for total of 3 weeks followed by aspirin only therapy Continue PT/OT  Acute hypoxic and hypercarbic resp failure S/p trach Acute hypoxic/hypercapnic respiratory failure due to diaphragmatic abdomen Eikenella corrodens HCAP 2/23  Ongoing aspiration  Continue lung protective ventilation Patient is not tolerating spontaneous breathing trial as she does not initiate breathing She remains ventilatory dependent Will call [facility] today  for evaluation of diaphragmatic pacemaker She completed antibiotic therapy Remain on 40% FiO2 and 5 of PEEP VAP prevention bundle in place Avoid sedation  Dysphagia s/p PEG  Continue tube feeds  Neurogenic bladder Monitor intake and output Continue Foley catheter  Situational depression Anxiety Disrupted sleep cycle Tobacco dependence Continue Zoloft, melatonin and trazodone and Continue baclofen and tizanidine Continue Klonopin  Hyponatremia Serum sodium trended down to 131 Stop free water flushes   GOC / DNR status -clear GOC -- aggressive care/rehab x18mo If Qol not acceptable at that point, transition to comfort care. Mom and medical team are supportive, and will continue to help facilitate best shot at recovery during this period of time.  PMT following, appreciate assistance    This patient is critically ill with multiple organ system failure which requires frequent high complexity decision making, assessment, support, evaluation, and titration of therapies. This was completed through the application of advanced monitoring technologies and extensive interpretation of multiple databases.  During this encounter critical care time was devoted to patient care services described in this note for 32 minutes.    SJacky Kindle MD [place] [facility] See Amion for pager If no response to pager, please call [phone] 7pm After 7pm, Please call E-link [phone]

## 2022-04-10 NOTE — Plan of Care (Addendum)
Call out to [facility] at [phone]  for reconsideration of transfer for diaphragmatic pacemaker. Await return call  1421 declined in transfer at this time    Eliseo Gum MSN, AGACNP-BC [place] 04/10/2022, 1:54 PM

## 2022-04-10 NOTE — Progress Notes (Signed)
Palliative Medicine Inpatient Follow Up Note HPI: 22 y.o. female  with past medical history of depression and suicidal ideation who presented to [facility] ED with sudden onset of shortness of breath and upper and lower extremity weakness. ED work-up found loss of speaking ability and hypercarbic respiratory failure and was subsequently intubated and transferred to [facility].  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    PMT was consulted for [place] conversations.  Today's Discussion 04/10/2022  *Please note that this is a verbal dictation therefore any spelling or grammatical errors are due to the "[place] One" system interpretation.  Chart reviewed inclusive of vital signs, progress notes, laboratory results, and diagnostic images.   I met this morning with Rachael [name] and her RN, Rachael [name]. Patient was very tearful this morning as she had just had a conversation with Dr. Tacy Learn who was forthright in terms of what recovery may look like for [facility].   Patient does not feel she would want to live in her present state of total dependence on other people. She does not desire to be on a ventilator or have the inability to walk/pick up her child.   Emotional support was provided given the gravity of the situation Rachael [name] is in and the decisions she is faced with moving forward.   Rachael [name] asks what the steps are if she decides to stop present interventions. We further discussed the idea of keeping her comfortable and allowing nature to take it's natural course. I shared that we would do this through the initiation of opioid gtts and anxiety medication. This would be started and once ideal comfort is achieved we would remove the ventilator. Rachael [name] shares many tears during this time. Both Rachael [name]  and I were able to provide her with support. Emphasized the importance of honoring what [name] wants.   I called patients mother, Rachael [name] who plans to come in this morning to speak more with Rachael [name]. I shared that I will be available all day should any questions/needs arise.   Questions and concerns addressed/Palliative Support Provided.   Objective Assessment: Vital Signs Vitals:   04/10/22 0600 04/10/22 0843  BP: (!) 106/55   Pulse: 82 (!) 105  Resp: (!) 22 (!) 22  Temp:    SpO2: 99% 100%    Intake/Output Summary (Last 24 hours) at 04/10/2022 [id number] Last data filed at 04/10/2022 0500 Gross per 24 hour  Intake 1644 ml  Output 2985 ml  Net -1341 ml    Last Weight  Most recent update: 04/09/2022  6:17 AM    Weight  90 kg (198 lb 6.6 oz)            Gen:  Young AA F in NAD HEENT: Tracheostomy, moist mucous membranes CV: Regular rate and rhythm  PULM:  On Vent ABD: soft/nontender  EXT: No edema  Neuro: Alert and oriented x3   SUMMARY OF RECOMMENDATIONS   DNAR  Appreciate involvement of Chaplain team for support  Patient is grappling with what will look like for her as a quadriplegic --> Patient is not sure she wants to continue on in her present circumstances  PMT will continue to support patient and family during this difficult time  Billing based on MDM: High  ______________________________________________________________________________________ [place] Team Team Cell Phone: [phone] Please utilize secure chat with additional questions,  if there is no response within 30 minutes please call the above phone number  Palliative Medicine Team providers are available by phone from 7am to 7pm daily and can be reached through the team cell phone.  Should this patient require assistance outside of these hours, please call the patient's attending physician.

## 2022-04-11 DIAGNOSIS — I6389 Other cerebral infarction: Secondary | ICD-10-CM | POA: Diagnosis not present

## 2022-04-11 DIAGNOSIS — G839 Paralytic syndrome, unspecified: Secondary | ICD-10-CM | POA: Diagnosis not present

## 2022-04-11 DIAGNOSIS — J9601 Acute respiratory failure with hypoxia: Secondary | ICD-10-CM | POA: Diagnosis not present

## 2022-04-11 DIAGNOSIS — G9511 Acute infarction of spinal cord (embolic) (nonembolic): Secondary | ICD-10-CM | POA: Diagnosis not present

## 2022-04-11 DIAGNOSIS — Z515 Encounter for palliative care: Secondary | ICD-10-CM | POA: Diagnosis not present

## 2022-04-11 DIAGNOSIS — Z7189 Other specified counseling: Secondary | ICD-10-CM | POA: Diagnosis not present

## 2022-04-11 LAB — BASIC METABOLIC PANEL
Anion gap: 9 (ref 5–15)
BUN: 15 mg/dL (ref 6–20)
CO2: 27 mmol/L (ref 22–32)
Calcium: 8.5 mg/dL — ABNORMAL LOW (ref 8.9–10.3)
Chloride: 95 mmol/L — ABNORMAL LOW (ref 98–111)
Creatinine, Ser: 0.62 mg/dL (ref 0.44–1.00)
GFR, Estimated: >60 mL/min (ref >60)
Glucose, Bld: 140 mg/dL — ABNORMAL HIGH (ref 70–99)
Potassium: 4.6 mmol/L (ref 3.5–5.1)
Sodium: 131 mmol/L — ABNORMAL LOW (ref 135–145)

## 2022-04-11 LAB — CBC
HCT: 27.6 % — ABNORMAL LOW (ref 36.0–46.0)
Hemoglobin: 8.7 g/dL — ABNORMAL LOW (ref 12.0–15.0)
MCH: 27.7 pg (ref 26.0–34.0)
MCHC: 31.5 g/dL (ref 30.0–36.0)
MCV: 87.9 fL (ref 80.0–100.0)
Platelets: 335 10*3/uL (ref 150–400)
RBC: 3.14 MIL/uL — ABNORMAL LOW (ref 3.87–5.11)
RDW: 14.4 % (ref 11.5–15.5)
WBC: 10.2 10*3/uL (ref 4.0–10.5)
nRBC: 0 % (ref 0.0–0.2)

## 2022-04-11 LAB — GLUCOSE, CAPILLARY
Glucose-Capillary: 144 mg/dL — ABNORMAL HIGH (ref 70–99)
Glucose-Capillary: 150 mg/dL — ABNORMAL HIGH (ref 70–99)
Glucose-Capillary: 168 mg/dL — ABNORMAL HIGH (ref 70–99)
Glucose-Capillary: 170 mg/dL — ABNORMAL HIGH (ref 70–99)
Glucose-Capillary: 173 mg/dL — ABNORMAL HIGH (ref 70–99)
Glucose-Capillary: 191 mg/dL — ABNORMAL HIGH (ref 70–99)

## 2022-04-11 MED ORDER — PIPERACILLIN-TAZOBACTAM 3.375 G IVPB
3.3750 g | Freq: Three times a day (TID) | INTRAVENOUS | Status: AC
  Administered 2022-04-11 – 2022-04-15 (×14): 3.375 g via INTRAVENOUS
  Filled 2022-04-11 (×14): qty 50

## 2022-04-11 NOTE — Progress Notes (Signed)
Pharmacy Antibiotic Note  Rachael [name] is a 22 y.o. female admitted on 03/19/2022 with  aspiration PNA .  Pharmacy has been consulted for Zosyn dosing. Patient presented with spinal cord infarct, now with paraplegia, Febrile to 103.23F with tachycardia. WBC 10.2.  Plan: Start Zosyn 3.375 g every 8 hours extended interval infusion Fluconazole x 5 days per MD F/u duration and clinical progress  Height: '5\' 7"'$  (170.2 cm) Weight: 90 kg (198 lb 6.6 oz) IBW/kg (Calculated) : 61.6  Temp (24hrs), Avg:101.6 F (38.7 C), Min:100.1 F (37.8 C), Max:103.1 F (39.5 C)  Recent Labs  Lab 04/05/22 0532 04/06/22 0433 04/07/22 0412 04/08/22 0425 04/09/22 0416 04/11/22 0343  WBC 16.3*  --   --  31.1* 15.8* 10.2  CREATININE 0.88 0.75 0.70 0.66 0.72 0.62    Estimated Creatinine Clearance: 128.2 mL/min (by C-G formula based on SCr of 0.62 mg/dL).    No Known Allergies  Antimicrobials this admission:  Unasyn 2/21>2/23 Vanc 2/23>2/25 restart 2/26>3/1 Zosyn 2/23>2/25  Ceftriaxone 2/25>2/26 Merop 2/26> 3/5  Microbiology results: Extensive history throughout hospital stay with no significant organisms cultured thus far.  2/28 TA: neg 2/26 normal flora 2/24 BCx x 2 > neg 2/19 urine- neg 2/19 BAL- EIKENELLA CORRODEN  2/18 trach- normal flora 2/16 blood x2- neg 3/10 resp cx: IP  Thank you for involving pharmacy in this patient's care.  [name], PharmD PGY2 Pharmacy Resident 04/11/2022 2:09 PM

## 2022-04-11 NOTE — Progress Notes (Addendum)
Palliative Medicine Inpatient Follow Up Note HPI: 22 y.o. female  with past medical history of depression and suicidal ideation who presented to [place] ED with sudden onset of shortness of breath and upper and lower extremity weakness. ED work-up found loss of speaking ability and hypercarbic respiratory failure and was subsequently intubated and transferred to [facility].  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    PMT was consulted for [place] conversations.  Today's Discussion 04/11/2022  *Please note that this is a verbal dictation therefore any spelling or grammatical errors are due to the "[place] One" system interpretation.  Chart reviewed inclusive of vital signs, progress notes, laboratory results, and diagnostic images. Continues to spike fevers.   I met this morning with Rachael [name] she was receiving respiratory care by the RT. She felt that she had a cuff leak which while I was at beside had resolved.   Rachael [name] is feeling frustrated that staff keep disrupting her as she vocalizes the desire to sleep.   We reviewed the difficulty of conversations which were held yesterday. Rachael [name] continues to consider her options. We did discuss if she did not want to treat infections we do not need to give antibiotics in the event that a route of comfort care is elected - I shared that we would treat symptoms during that time.   Discussed that I would be available all day today and tomorrow is additional questions and concerns arise.  _________________________________ Addendum:  Per secure chat with patients RN, Benjamine Mola the plan will be for broad spectrum antibiotics for [facility] though an infectious source has not yet been identified she remains to have increased fever spikes.   It  was requested that PMT come down to verify that broad antibiotics are a measure that Rachael [name] would like to pursue.  I met with Rachael [name] at bedside this afternoon. I was able to confirm with her the desire for antibiotic treatment of her suspected infection.   Additional Time: 22  Objective Assessment: Vital Signs Vitals:   04/11/22 0804 04/11/22 0811  BP:    Pulse:    Resp:    Temp:    SpO2: 99% 99%    Intake/Output Summary (Last 24 hours) at 04/11/2022 [id number] Last data filed at 04/11/2022 0600 Gross per 24 hour  Intake 1012.68 ml  Output 3910 ml  Net -2897.32 ml    Last Weight  Most recent update: 04/09/2022  6:17 AM    Weight  90 kg (198 lb 6.6 oz)            Gen:  Young AA F in NAD HEENT: Tracheostomy, moist mucous membranes CV: Regular rate and rhythm  PULM:  On Vent ABD: soft/nontender  EXT: No edema  Neuro: Alert and oriented x3   SUMMARY OF RECOMMENDATIONS   DNAR  Appreciate involvement of Chaplain team for support --> Patient feels hopeless  Patient is grappling with what will look like for her as a quadriplegic --> Patient is not sure she wants to continue on in her present circumstances. Dr. Tacy Learn was able to share openly and honestly that she will not be functional the way she would like in the future and will maximum care.  PMT will continue to support patient and family during this difficult time  Billing based on MDM: High  ______________________________________________________________________________________ [place]  Palliative Medicine Team Team Cell Phone: [phone] Please utilize secure chat with additional questions, if there is no response within 30 minutes please call the above phone number  Palliative Medicine Team providers are available by phone from 7am to 7pm daily and can be reached through the team cell phone.  Should this patient require assistance outside of these hours, please call the patient's attending  physician.

## 2022-04-11 NOTE — Progress Notes (Signed)
Sputum sample obtained and sent to lab.  

## 2022-04-11 NOTE — Progress Notes (Signed)
NAME:  Rachael [name], MRN:  [id number], DOB:  22-Jan-2001, LOS: 63 ADMISSION DATE:  03/19/2022, CONSULTATION DATE:  04/11/22 REFERRING MD:  drawbridge, CHIEF COMPLAINT:  shortness of breath and weakness   History of Present Illness:  Rachael [name] is a 22 y.o. F with history of depression and suicidal ideation who presented to [place] ED with sudden onset of shortness of breath and upper and lower extremity weakness.  History taken from Epic as patient is intubated and no family is at the bedside.  She initially had a headache and back pain prior to onset but denied abdominal pain or chest pain.  Vitals showed an initial temp of 59F, bloodwork with WBC 18k, UDS + for bezos and THC.   Her symptoms progressed to the point she wasn't speaking and ABG showed hypercarbic respiratory failure so pt was intubated.  She had some bradycardia and hypotension post-intubation, so was started on peripheral Levophed and given atropine during transport   Pertinent  Medical History   has a past medical history of Depression, Hyperlipidemia, and Kidney abscess.  Significant Hospital Events: Including procedures, antibiotic start and stop dates in addition to other pertinent events   2/16 presented to [facility] with all weakness and shortness of breath requiring intubation and transfer to [facility] 2/18 episodes of desaturations and hypercarbia unexplained 2/19 trach, cortrak, PICC 2/23 therapeutic bronch 2/24 Eikenella corrodens BAL. Not triggering vent  2/25 narrow abx from vanc zosyn to rocephin. Will reach out to OSH re transfer and consideration of diaphragmatic pacemaker - unfortunately no beds at [facility] and declined transfer at [facility] 2/26 fevers, abx broadened> vanc/ merrem, diuresing 2/27 chest CT, MRI brain, ongoing fevers, did not tolerate/ like PMV 2/28 trach aspirate resent as 2/26 neg.  Continued fevers.  Episode of vomiting after large bolus feeding.  Remains on 70% 2/29 ongoing fevers, working with SLP for  PMV 3/3 jump in scr, ?bladder outlet obstruction, foley replaced  3/4 creatinine has improved. When respiratory rate dropped, patient not able to initiate breath on her own.  3/7 creatinine has improved. Complaining of headache. Still having diarrhea.  3/9 [facility] refused transfer   Interim History / Subjective:  Patient stated she could not sleep overnight This morning she was spiking fever with Tmax 101.6 X-ray chest was repeated yesterday showing increased endometrial cardiac opacity nondistended right middle lobe pneumonia  Objective   Blood pressure 117/71, pulse (!) 103, temperature (!) 101.6 F (38.7 C), temperature source Oral, resp. rate (!) 22, height '5\' 7"'$  (1.702 m), weight 90 kg, SpO2 98 %.    Vent Mode: PRVC FiO2 (%):  [40 %] 40 % Set Rate:  [22 bmp] 22 bmp Vt Set:  [490 mL] 490 mL PEEP:  [5 cmH20] 5 cmH20 Plateau Pressure:  [17 cmH20-18 cmH20] 18 cmH20   Intake/Output Summary (Last 24 hours) at 04/11/2022 0803 Last data filed at 04/11/2022 0600 Gross per 24 hour  Intake 1012.68 ml  Output 3910 ml  Net -2897.32 ml   Filed Weights   04/06/22 0437 04/07/22 0413 04/09/22 0500  Weight: 99 kg 98.7 kg 90 kg   Physical exam: General: Acutely ill-appearing young female, lying on the bed HEENT: [place]/AT, eyes anicteric.  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 Continue aspirin and Plavix for total of 3 weeks starting 2/23 followed by aspirin only therapy Continue PT/OT [facility] refused her transfer for diaphragmatic  pacemaker  Acute hypoxic and hypercarbic resp failure S/p trach Ongoing aspiration  Likely recurrent pneumonia Continue lung protective ventilation Patient is not tolerating spontaneous breathing trial as she does not initiate breathing She remains ventilator dependent [facility] denied her transfer for evaluation of diaphragmatic pacemaker Will get repeat respiratory culture as patient started spiking fever again and Chest x-ray is showing increasing retrocardiac opacity On 40% FiO2 and 5 of PEEP VAP prevention bundle in place Avoid sedation  Dysphagia s/p PEG  Continue tube feeds  Neurogenic bladder Monitor intake and output Continue Foley catheter  Situational depression Anxiety Disrupted sleep cycle Tobacco dependence Continue Zoloft, melatonin and trazodone and Continue baclofen and tizanidine Continue Klonopin  Hyponatremia Serum sodium remained 131 Hold free water flushes, monitor electrolytes   This patient is critically ill with multiple organ system failure which requires frequent high complexity decision making, assessment, support, evaluation, and titration of therapies. This was completed through the application of advanced monitoring technologies and extensive interpretation of multiple databases.  During this encounter critical care time was devoted to patient care services described in this note for 31 minutes.    Jacky Kindle, MD [place] [facility] See Amion for pager If no response to pager, please call [phone] until 7pm After 7pm, Please call E-link [phone]

## 2022-04-11 NOTE — Progress Notes (Signed)
CPT held at this time due to patient sleeping.  

## 2022-04-12 DIAGNOSIS — F329 Major depressive disorder, single episode, unspecified: Secondary | ICD-10-CM | POA: Diagnosis not present

## 2022-04-12 DIAGNOSIS — E8729 Other acidosis: Secondary | ICD-10-CM | POA: Diagnosis not present

## 2022-04-12 DIAGNOSIS — J189 Pneumonia, unspecified organism: Secondary | ICD-10-CM | POA: Diagnosis not present

## 2022-04-12 DIAGNOSIS — I6389 Other cerebral infarction: Secondary | ICD-10-CM | POA: Diagnosis not present

## 2022-04-12 LAB — BASIC METABOLIC PANEL
Anion gap: 12 (ref 5–15)
BUN: 10 mg/dL (ref 6–20)
CO2: 27 mmol/L (ref 22–32)
Calcium: 8.9 mg/dL (ref 8.9–10.3)
Chloride: 93 mmol/L — ABNORMAL LOW (ref 98–111)
Creatinine, Ser: 0.56 mg/dL (ref 0.44–1.00)
GFR, Estimated: >60 mL/min (ref >60)
Glucose, Bld: 139 mg/dL — ABNORMAL HIGH (ref 70–99)
Potassium: 3.6 mmol/L (ref 3.5–5.1)
Sodium: 132 mmol/L — ABNORMAL LOW (ref 135–145)

## 2022-04-12 LAB — GLUCOSE, CAPILLARY
Glucose-Capillary: 146 mg/dL — ABNORMAL HIGH (ref 70–99)
Glucose-Capillary: 152 mg/dL — ABNORMAL HIGH (ref 70–99)
Glucose-Capillary: 152 mg/dL — ABNORMAL HIGH (ref 70–99)
Glucose-Capillary: 174 mg/dL — ABNORMAL HIGH (ref 70–99)
Glucose-Capillary: 180 mg/dL — ABNORMAL HIGH (ref 70–99)
Glucose-Capillary: 219 mg/dL — ABNORMAL HIGH (ref 70–99)
Glucose-Capillary: 221 mg/dL — ABNORMAL HIGH (ref 70–99)

## 2022-04-12 LAB — CBC
HCT: 28 % — ABNORMAL LOW (ref 36.0–46.0)
Hemoglobin: 9.2 g/dL — ABNORMAL LOW (ref 12.0–15.0)
MCH: 27.9 pg (ref 26.0–34.0)
MCHC: 32.9 g/dL (ref 30.0–36.0)
MCV: 84.8 fL (ref 80.0–100.0)
Platelets: 325 10*3/uL (ref 150–400)
RBC: 3.3 MIL/uL — ABNORMAL LOW (ref 3.87–5.11)
RDW: 14.2 % (ref 11.5–15.5)
WBC: 13.9 10*3/uL — ABNORMAL HIGH (ref 4.0–10.5)
nRBC: 0 % (ref 0.0–0.2)

## 2022-04-12 NOTE — Progress Notes (Signed)
eLink Physician-Brief Progress Note Patient Name: Rachael [name] DOB: [birth year] MRN: [id number]   Date of Service  04/12/2022  HPI/Events of Note  Receiving bladder scans and foley in place  eICU Interventions  DC bladder scans     Intervention Category Minor Interventions: Routine modifications to care plan (e.g. PRN medications for pain, fever)  Beau Vanduzer 04/12/2022, 9:54 PM

## 2022-04-12 NOTE — Progress Notes (Addendum)
NAME:  Rachael [name], MRN:  [id number], DOB:  Oct 20, 2000, LOS: 24 ADMISSION DATE:  03/19/2022, CONSULTATION DATE:  04/12/22 REFERRING MD:  drawbridge, CHIEF COMPLAINT:  shortness of breath and weakness   History of Present Illness:  Rachael [name] is a 22 y.o. F with history of depression and suicidal ideation who presented to [place] ED with sudden onset of shortness of breath and upper and lower extremity weakness.  History taken from Epic as patient is intubated and no family is at the bedside.  She initially had a headache and back pain prior to onset but denied abdominal pain or chest pain.  Vitals showed an initial temp of 57F, bloodwork with WBC 18k, UDS + for bezos and THC.   Her symptoms progressed to the point she wasn't speaking and ABG showed hypercarbic respiratory failure so pt was intubated.  She had some bradycardia and hypotension post-intubation, so was started on peripheral Levophed and given atropine during transport   Pertinent  Medical History   has a past medical history of Depression, Hyperlipidemia, and Kidney abscess.  Significant Hospital Events: Including procedures, antibiotic start and stop dates in addition to other pertinent events   2/16 presented to DB with all weakness and shortness of breath requiring intubation and transfer to [facility] 2/18 episodes of desaturations and hypercarbia unexplained 2/19 trach, cortrak, PICC 2/23 therapeutic bronch 2/24 Eikenella corrodens BAL. Not triggering vent  2/25 narrow abx from vanc zosyn to rocephin. Will reach out to OSH re transfer and consideration of diaphragmatic pacemaker - unfortunately no beds at [facility] and declined transfer at [facility] 2/26 fevers, abx broadened> vanc/ merrem, diuresing 2/27 chest CT, MRI brain, ongoing fevers, did not tolerate/ like PMV 2/28 trach aspirate resent as 2/26 neg.  Continued fevers.  Episode of vomiting after large bolus feeding.  Remains on 70% 2/29 ongoing fevers, working with SLP for  PMV 3/3 jump in scr, ?bladder outlet obstruction, foley replaced  3/4 creatinine has improved. When respiratory rate dropped, patient not able to initiate breath on her own.  3/7 creatinine has improved. Complaining of headache. Still having diarrhea.  3/9 [facility] refused transfer   Interim History / Subjective:  Pt states she is tired, and is having a hard time sleeping.  Temp 3/11 am is 100.6 Started on Zoxyn 3/10 for temp spikes  Objective   Blood pressure 121/71, pulse 95, temperature (!) 100.6 F (38.1 C), temperature source Oral, resp. rate 16, height '5\' 7"'$  (1.702 m), weight 90 kg, SpO2 98 %.    Vent Mode: PRVC FiO2 (%):  [40 %] 40 % Set Rate:  [22 bmp] 22 bmp Vt Set:  [490 mL] 490 mL PEEP:  [5 cmH20] 5 cmH20 Plateau Pressure:  [15 cmH20-21 cmH20] 18 cmH20   Intake/Output Summary (Last 24 hours) at 04/12/2022 0732 Last data filed at 04/12/2022 0600 Gross per 24 hour  Intake 936.76 ml  Output 5105 ml  Net -4168.24 ml   Filed Weights   04/06/22 0437 04/07/22 0413 04/09/22 0500  Weight: 99 kg 98.7 kg 90 kg   Physical exam: General: Acutely ill-appearing young female, lying on the bed, awake and communicative  HEENT: [place]/AT, eyes anicteric.  moist mucus membranes, s/p trach Neuro: Alert, awake following commands, Answering questions , frustrated with her current situation Chest: Bilateral chest excursion, Coarse breath sounds throughout, no wheezes + rhonchi Heart: SR, regular rate and  rhythm, no murmurs or gallops Abdomen: Soft, nontender, nondistended, bowel sounds present Skin: No rash, Clean , dry and  intact  Labs Reviewed WBC 13.9( 10.2) / HGB 9.2/ Platelets 325 K Na 132/ K 3.6/ Cl 93/ CO2 27/ Glucose 139/ BUN 10/ Creatinine  0.56/ GAP 12/ Ca 8.5( Corrects to 10.1) Respiratory Cx 04/11/22>> GS few gram + cocci in pairs, rare squamous epithelial cells    Resolved Hospital problems  Mucous plugging AKI Eikenella corrodens pneumonia  Assessment & Plan:  Acute  cervical spinal cord infarct resulting in quadriplegia Continue aspirin and Plavix for total of 3 weeks starting 2/23 followed by aspirin only therapy Continue PT/OT [facility] refused her transfer for diaphragmatic pacemaker  Acute hypoxic and hypercarbic resp failure S/p trach Ongoing aspiration  Likely recurrent pneumonia Continue lung protective ventilation Patient is not tolerating spontaneous breathing trial as she does not initiate breathing She remains ventilator dependent [facility] denied her transfer for evaluation of diaphragmatic pacemaker Will get repeat respiratory culture as patient started spiking fever again and Chest x-ray is showing increasing retrocardiac opacity On 40% FiO2 and 5 of PEEP VAP prevention bundle in place Avoid sedation CXR prn  Dysphagia s/p PEG  Continue tube feeds  Neurogenic bladder Monitor intake and output Continue Foley catheter  Situational depression Anxiety Disrupted sleep cycle Tobacco dependence Continue Zoloft, melatonin and trazodone and Continue baclofen and tizanidine Continue Klonopin  Hyponatremia Serum sodium remained 132 Hold free water flushes, monitor electrolytes  Ongoing discussions with Palliative Care , Assistance is greatly appreciated   This patient is critically ill with multiple organ system failure which requires frequent high complexity decision making, assessment, support, evaluation, and titration of therapies. This was completed through the application of advanced monitoring technologies and extensive interpretation of multiple databases.  During this encounter critical care time was devoted to patient care services described in this note for 30 minutes.    Magdalen Spatz, MSN, AGACNP-BC [place] for personal pager PCCM on call pager [phone]  [facility] See Amion for pager If no response to pager, please call [phone] until 7pm After 7pm,  Please call E-link [phone] 04/12/2022 7:59 AM

## 2022-04-12 NOTE — TOC Progression Note (Signed)
Transition of Care [facility]) - Progression Note    Patient Details  Name: Rachael [name] MRN: [id number] Date of Birth: 2001/01/18  Transition of Care [facility]) CM/SW [place], [place] Phone Number: 04/12/2022, 3:26 PM  Clinical Narrative:     CSW following patient to fax out patient for vent/snf once patient close to being medically ready and  trach is 35 days old.  CSW will continue to follow and assist with patients dc planning needs.   Expected Discharge Plan: [place] Barriers to Discharge: Continued Medical Work up  Expected Discharge Plan and Services In-house Referral: Clinical Social Work Discharge Planning Services: CM Consult   Living arrangements for the past 2 months: Single Family Home                                       Social Determinants of Health (SDOH) Interventions SDOH Screenings   Food Insecurity: No Food Insecurity (03/23/2022)  Housing: Low Risk  (03/23/2022)  Transportation Needs: No Transportation Needs (03/23/2022)  Utilities: Not At Risk (03/23/2022)  Tobacco Use: Medium Risk (03/26/2022)    Readmission Risk Interventions     No data to display

## 2022-04-12 NOTE — Progress Notes (Addendum)
   04/12/22 1115  Spiritual Encounters  Type of Visit Initial  Care provided to: Pt and family  Conversation partners present during encounter Nurse  Referral source IDT Rounds  Reason for visit Routine spiritual support  OnCall Visit No   Melven Sartorius was introduced to Pt during IDT rounds. Cahp reviewed chart and visited.  Pt's father was present rubbing Pt's feet.  Melven Sartorius introduced himself and was welcomed into the room by father.  Pt did not interact excerpt to answer questions when asked.  Melven Sartorius tried to engage Pt on deeper level but Pt did not allow conversation to go very far. Melven Sartorius will continue to monitor patient status and return at another time when Pt may be more open. Melven Sartorius met parents in the waiting area and spoke with them at length.  Mother is struggling to find strength to walk with her daughter.  Melven Sartorius spoke with her about coping mechanisms and the need for self care. Prayed with both parents before leaving.  Chaplain services remains available when needed.

## 2022-04-13 ENCOUNTER — Inpatient Hospital Stay (HOSPITAL_COMMUNITY): Payer: Medicaid Other

## 2022-04-13 DIAGNOSIS — J189 Pneumonia, unspecified organism: Secondary | ICD-10-CM | POA: Diagnosis not present

## 2022-04-13 DIAGNOSIS — G8250 Quadriplegia, unspecified: Secondary | ICD-10-CM | POA: Diagnosis not present

## 2022-04-13 DIAGNOSIS — G839 Paralytic syndrome, unspecified: Secondary | ICD-10-CM | POA: Diagnosis not present

## 2022-04-13 DIAGNOSIS — Z515 Encounter for palliative care: Secondary | ICD-10-CM | POA: Diagnosis not present

## 2022-04-13 DIAGNOSIS — I6389 Other cerebral infarction: Secondary | ICD-10-CM | POA: Diagnosis not present

## 2022-04-13 DIAGNOSIS — Z7189 Other specified counseling: Secondary | ICD-10-CM | POA: Diagnosis not present

## 2022-04-13 LAB — COMPREHENSIVE METABOLIC PANEL
ALT: 253 U/L — ABNORMAL HIGH (ref 0–44)
AST: 78 U/L — ABNORMAL HIGH (ref 15–41)
Albumin: 1.8 g/dL — ABNORMAL LOW (ref 3.5–5.0)
Alkaline Phosphatase: 110 U/L (ref 38–126)
Anion gap: 13 (ref 5–15)
BUN: 14 mg/dL (ref 6–20)
CO2: 25 mmol/L (ref 22–32)
Calcium: 8.6 mg/dL — ABNORMAL LOW (ref 8.9–10.3)
Chloride: 95 mmol/L — ABNORMAL LOW (ref 98–111)
Creatinine, Ser: 0.49 mg/dL (ref 0.44–1.00)
GFR, Estimated: >60 mL/min (ref >60)
Glucose, Bld: 123 mg/dL — ABNORMAL HIGH (ref 70–99)
Potassium: 3.4 mmol/L — ABNORMAL LOW (ref 3.5–5.1)
Sodium: 133 mmol/L — ABNORMAL LOW (ref 135–145)
Total Bilirubin: 0.5 mg/dL (ref 0.3–1.2)
Total Protein: 6 g/dL — ABNORMAL LOW (ref 6.5–8.1)

## 2022-04-13 LAB — CBC WITH DIFFERENTIAL/PLATELET
Abs Immature Granulocytes: 0.9 10*3/uL — ABNORMAL HIGH (ref 0.00–0.07)
Basophils Absolute: 0.2 10*3/uL — ABNORMAL HIGH (ref 0.0–0.1)
Basophils Relative: 1 %
Eosinophils Absolute: 0.6 10*3/uL — ABNORMAL HIGH (ref 0.0–0.5)
Eosinophils Relative: 4 %
HCT: 26.9 % — ABNORMAL LOW (ref 36.0–46.0)
Hemoglobin: 9 g/dL — ABNORMAL LOW (ref 12.0–15.0)
Lymphocytes Relative: 17 %
Lymphs Abs: 2.6 10*3/uL (ref 0.7–4.0)
MCH: 28.3 pg (ref 26.0–34.0)
MCHC: 33.5 g/dL (ref 30.0–36.0)
MCV: 84.6 fL (ref 80.0–100.0)
Metamyelocytes Relative: 3 %
Monocytes Absolute: 1.2 10*3/uL — ABNORMAL HIGH (ref 0.1–1.0)
Monocytes Relative: 8 %
Myelocytes: 1 %
Neutro Abs: 9.7 10*3/uL — ABNORMAL HIGH (ref 1.7–7.7)
Neutrophils Relative %: 64 %
Platelets: 326 10*3/uL (ref 150–400)
Promyelocytes Relative: 2 %
RBC: 3.18 MIL/uL — ABNORMAL LOW (ref 3.87–5.11)
RDW: 14 % (ref 11.5–15.5)
WBC: 15.1 10*3/uL — ABNORMAL HIGH (ref 4.0–10.5)
nRBC: 0 % (ref 0.0–0.2)
nRBC: 0 /100 WBC

## 2022-04-13 LAB — CULTURE, RESPIRATORY W GRAM STAIN: Culture: NORMAL

## 2022-04-13 LAB — GLUCOSE, CAPILLARY
Glucose-Capillary: 112 mg/dL — ABNORMAL HIGH (ref 70–99)
Glucose-Capillary: 141 mg/dL — ABNORMAL HIGH (ref 70–99)
Glucose-Capillary: 203 mg/dL — ABNORMAL HIGH (ref 70–99)
Glucose-Capillary: 227 mg/dL — ABNORMAL HIGH (ref 70–99)
Glucose-Capillary: 231 mg/dL — ABNORMAL HIGH (ref 70–99)
Glucose-Capillary: 232 mg/dL — ABNORMAL HIGH (ref 70–99)

## 2022-04-13 MED ORDER — POTASSIUM CHLORIDE 20 MEQ PO PACK
40.0000 meq | PACK | Freq: Once | ORAL | Status: AC
  Administered 2022-04-13: 40 meq
  Filled 2022-04-13: qty 2

## 2022-04-13 MED ORDER — FUROSEMIDE 10 MG/ML IJ SOLN
40.0000 mg | Freq: Once | INTRAMUSCULAR | Status: AC
  Administered 2022-04-13: 40 mg via INTRAVENOUS
  Filled 2022-04-13: qty 4

## 2022-04-13 NOTE — Plan of Care (Signed)
Problem: Education: Goal: Knowledge of General Education information will improve Description: Including pain rating scale, medication(s)/side effects and non-pharmacologic comfort measures 04/13/2022 0521 by Mena Goes, RN Outcome: Not Progressing 04/13/2022 0521 by Mena Goes, RN Outcome: Not Progressing   Problem: Health Behavior/Discharge Planning: Goal: Ability to manage health-related needs will improve 04/13/2022 0521 by Mena Goes, RN Outcome: Not Progressing 04/13/2022 0521 by Mena Goes, RN Outcome: Not Progressing   Problem: Clinical Measurements: Goal: Ability to maintain clinical measurements within normal limits will improve 04/13/2022 0521 by Mena Goes, RN Outcome: Not Progressing 04/13/2022 0521 by Mena Goes, RN Outcome: Not Progressing Goal: Will remain free from infection 04/13/2022 0521 by Mena Goes, RN Outcome: Not Progressing 04/13/2022 0521 by Mena Goes, RN Outcome: Not Progressing Goal: Diagnostic test results will improve 04/13/2022 0521 by Mena Goes, RN Outcome: Not Progressing 04/13/2022 0521 by Mena Goes, RN Outcome: Not Progressing Goal: Respiratory complications will improve 04/13/2022 0521 by Mena Goes, RN Outcome: Not Progressing 04/13/2022 0521 by Mena Goes, RN Outcome: Not Progressing Goal: Cardiovascular complication will be avoided 04/13/2022 0521 by Mena Goes, RN Outcome: Not Progressing 04/13/2022 0521 by Mena Goes, RN Outcome: Not Progressing   Problem: Activity: Goal: Risk for activity intolerance will decrease 04/13/2022 0521 by Mena Goes, RN Outcome: Not Progressing 04/13/2022 0521 by Mena Goes, RN Outcome: Not Progressing   Problem: Nutrition: Goal: Adequate nutrition will be maintained 04/13/2022 0521 by Mena Goes, RN Outcome: Not Progressing 04/13/2022 0521 by Mena Goes, RN Outcome: Not Progressing   Problem: Coping: Goal: Level  of anxiety will decrease 04/13/2022 0521 by Mena Goes, RN Outcome: Not Progressing 04/13/2022 0521 by Mena Goes, RN Outcome: Not Progressing   Problem: Elimination: Goal: Will not experience complications related to bowel motility 04/13/2022 0521 by Mena Goes, RN Outcome: Not Progressing 04/13/2022 0521 by Mena Goes, RN Outcome: Not Progressing Goal: Will not experience complications related to urinary retention 04/13/2022 0521 by Mena Goes, RN Outcome: Not Progressing 04/13/2022 0521 by Mena Goes, RN Outcome: Not Progressing   Problem: Pain Managment: Goal: General experience of comfort will improve 04/13/2022 0521 by Mena Goes, RN Outcome: Not Progressing 04/13/2022 0521 by Mena Goes, RN Outcome: Not Progressing   Problem: Safety: Goal: Ability to remain free from injury will improve 04/13/2022 0521 by Mena Goes, RN Outcome: Not Progressing 04/13/2022 0521 by Mena Goes, RN Outcome: Not Progressing   Problem: Skin Integrity: Goal: Risk for impaired skin integrity will decrease 04/13/2022 0521 by Mena Goes, RN Outcome: Not Progressing 04/13/2022 0521 by Mena Goes, RN Outcome: Not Progressing   Problem: Activity: Goal: Ability to tolerate increased activity will improve 04/13/2022 0521 by Mena Goes, RN Outcome: Not Progressing 04/13/2022 0521 by Mena Goes, RN Outcome: Not Progressing   Problem: Respiratory: Goal: Ability to maintain a clear airway and adequate ventilation will improve 04/13/2022 0521 by Mena Goes, RN Outcome: Not Progressing 04/13/2022 0521 by Mena Goes, RN Outcome: Not Progressing   Problem: Role Relationship: Goal: Method of communication will improve 04/13/2022 0521 by Mena Goes, RN Outcome: Not Progressing 04/13/2022 0521 by Mena Goes, RN Outcome: Not Progressing   Problem: Education: Goal: Knowledge of disease or condition will improve 04/13/2022  0521 by Mena Goes, RN Outcome: Not Progressing 04/13/2022 0521 by Mena Goes, RN Outcome: Not Progressing Goal: Knowledge  of secondary prevention will improve (MUST DOCUMENT ALL) 04/13/2022 0521 by Mena Goes, RN Outcome: Not Progressing 04/13/2022 0521 by Mena Goes, RN Outcome: Not Progressing Goal: Knowledge of patient specific risk factors will improve [name] N/A or DELETE if not current risk factor) 04/13/2022 0521 by Mena Goes, RN Outcome: Not Progressing 04/13/2022 0521 by Mena Goes, RN Outcome: Not Progressing   Problem: Ischemic Stroke/TIA Tissue Perfusion: Goal: Complications of ischemic stroke/TIA will be minimized 04/13/2022 0521 by Mena Goes, RN Outcome: Not Progressing 04/13/2022 0521 by Mena Goes, RN Outcome: Not Progressing   Problem: Coping: Goal: Will verbalize positive feelings about self 04/13/2022 0521 by Mena Goes, RN Outcome: Not Progressing 04/13/2022 0521 by Mena Goes, RN Outcome: Not Progressing Goal: Will identify appropriate support needs 04/13/2022 0521 by Mena Goes, RN Outcome: Not Progressing 04/13/2022 0521 by Mena Goes, RN Outcome: Not Progressing   Problem: Health Behavior/Discharge Planning: Goal: Ability to manage health-related needs will improve 04/13/2022 0521 by Mena Goes, RN Outcome: Not Progressing 04/13/2022 0521 by Mena Goes, RN Outcome: Not Progressing Goal: Goals will be collaboratively established with patient/family 04/13/2022 0521 by Mena Goes, RN Outcome: Not Progressing 04/13/2022 0521 by Mena Goes, RN Outcome: Not Progressing   Problem: Self-Care: Goal: Ability to participate in self-care as condition permits will improve 04/13/2022 0521 by Mena Goes, RN Outcome: Not Progressing 04/13/2022 0521 by Mena Goes, RN Outcome: Not Progressing Goal: Verbalization of feelings and concerns over difficulty with self-care will  improve 04/13/2022 0521 by Mena Goes, RN Outcome: Not Progressing 04/13/2022 0521 by Mena Goes, RN Outcome: Not Progressing Goal: Ability to communicate needs accurately will improve 04/13/2022 0521 by Mena Goes, RN Outcome: Not Progressing 04/13/2022 0521 by Mena Goes, RN Outcome: Not Progressing   Problem: Nutrition: Goal: Risk of aspiration will decrease 04/13/2022 0521 by Mena Goes, RN Outcome: Not Progressing 04/13/2022 0521 by Mena Goes, RN Outcome: Not Progressing Goal: Dietary intake will improve 04/13/2022 0521 by Mena Goes, RN Outcome: Not Progressing 04/13/2022 0521 by Mena Goes, RN Outcome: Not Progressing   Problem: Education: Goal: Knowledge about tracheostomy care/management will improve 04/13/2022 0521 by Mena Goes, RN Outcome: Not Progressing 04/13/2022 0521 by Mena Goes, RN Outcome: Not Progressing   Problem: Activity: Goal: Ability to tolerate increased activity will improve 04/13/2022 0521 by Mena Goes, RN Outcome: Not Progressing 04/13/2022 0521 by Mena Goes, RN Outcome: Not Progressing   Problem: Health Behavior/Discharge Planning: Goal: Ability to manage tracheostomy will improve 04/13/2022 0521 by Mena Goes, RN Outcome: Not Progressing 04/13/2022 0521 by Mena Goes, RN Outcome: Not Progressing   Problem: Respiratory: Goal: Patent airway maintenance will improve 04/13/2022 0521 by Mena Goes, RN Outcome: Not Progressing 04/13/2022 0521 by Mena Goes, RN Outcome: Not Progressing   Problem: Role Relationship: Goal: Ability to communicate will improve 04/13/2022 0521 by Mena Goes, RN Outcome: Not Progressing 04/13/2022 0521 by Mena Goes, RN Outcome: Not Progressing

## 2022-04-13 NOTE — Progress Notes (Addendum)
NAME:  Rachael [name], MRN:  [id number], DOB:  12-Mar-2000, LOS: 77 ADMISSION DATE:  03/19/2022, CONSULTATION DATE:  04/13/22 REFERRING MD:  drawbridge, CHIEF COMPLAINT:  shortness of breath and weakness   History of Present Illness:  Rachael [name] is a 22 y.o. F with history of depression and suicidal ideation who presented to [place] ED with sudden onset of shortness of breath and upper and lower extremity weakness.  History taken from Epic as patient is intubated and no family is at the bedside.  She initially had a headache and back pain prior to onset but denied abdominal pain or chest pain.  Vitals showed an initial temp of 72F, bloodwork with WBC 18k, UDS + for bezos and THC.   Her symptoms progressed to the point she wasn't speaking and ABG showed hypercarbic respiratory failure so pt was intubated.  She had some bradycardia and hypotension post-intubation, so was started on peripheral Levophed and given atropine during transport   Pertinent  Medical History   has a past medical history of Depression, Hyperlipidemia, and Kidney abscess.  Significant Hospital Events: Including procedures, antibiotic start and stop dates in addition to other pertinent events   2/16 presented to [facility] with all weakness and shortness of breath requiring intubation and transfer to [facility] 2/18 episodes of desaturations and hypercarbia unexplained 2/19 trach, cortrak, PICC 2/23 therapeutic bronch 2/24 Eikenella corrodens BAL. Not triggering vent  2/25 narrow abx from vanc zosyn to rocephin. Will reach out to OSH re transfer and consideration of diaphragmatic pacemaker - unfortunately no beds at [facility] and declined transfer at [facility] 2/26 fevers, abx broadened> vanc/ merrem, diuresing 2/27 chest CT, MRI brain, ongoing fevers, did not tolerate/ like PMV 2/28 trach aspirate resent as 2/26 neg.  Continued fevers.  Episode of vomiting after large bolus feeding.  Remains on 70% 2/29 ongoing fevers, working with SLP for  PMV 3/3 jump in scr, ?bladder outlet obstruction, foley replaced  3/4 creatinine has improved. When respiratory rate dropped, patient not able to initiate breath on her own.  3/7 creatinine has improved. Complaining of headache. Still having diarrhea.  3/9 [facility] refused transfer   Interim History / Subjective:  Became afebrile White count started trending down Stated could not sleep overnight  Objective   Blood pressure 116/64, pulse 90, temperature 98.2 F (36.8 C), temperature source Oral, resp. rate (!) 22, height '5\' 7"'$  (1.702 m), weight 90 kg, SpO2 94 %.    Vent Mode: PRVC FiO2 (%):  [40 %] 40 % Set Rate:  [22 bmp] 22 bmp Vt Set:  [490 mL] 490 mL PEEP:  [5 cmH20] 5 cmH20 Plateau Pressure:  [18 cmH20-21 cmH20] 20 cmH20   Intake/Output Summary (Last 24 hours) at 04/13/2022 0839 Last data filed at 04/13/2022 0800 Gross per 24 hour  Intake 1276.09 ml  Output 3705 ml  Net -2428.91 ml   Filed Weights   04/06/22 0437 04/07/22 0413 04/09/22 0500  Weight: 99 kg 98.7 kg 90 kg   Physical exam: General: Acute on chronically ill-appearing female, lying on the bed HEENT: [place]/AT, eyes anicteric.  moist mucus membranes, s/p trach Neuro: Alert, awake following commands.  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 Continue  aspirin and Plavix for total of 3 weeks starting 2/23 followed by aspirin only therapy Continue PT/OT Patient will need SNF with ventilator placement   Acute hypoxic and hypercarbic resp failure S/p trach Ongoing aspiration  Likely recurrent pneumonia with gram-positive cocci in chains Continue lung protective  ventilation Patient did not initiate breathing when placed on a spontaneous breathing trial more Per [facility] patient is not a candidate for diaphragmatic pacemaker Respiratory culture is growing gram-positive cocci in chain On 40% FiO2 and 5 of PEEP VAP prevention bundle in place Avoid sedation Now afebrile, white count is trending down  Dysphagia s/p PEG  Continue tube feeds  Neurogenic bladder Monitor intake and output Continue Foley catheter  Situational depression Anxiety Disrupted sleep cycle Tobacco dependence Continue Zoloft, melatonin and trazodone Continue baclofen and tizanidine Continue Klonopin  Hyponatremia Hypokalemia Serum sodium remained 132 Hold free water flushes, monitor electrolytes Continue potassium supplement   Ongoing discussions with Palliative Care , Assistance is greatly appreciated   This patient is critically ill with multiple organ system failure which requires frequent high complexity decision making, assessment, support, evaluation, and titration of therapies. This was completed through the application of advanced monitoring technologies and extensive interpretation of multiple databases.  During this encounter critical care time was devoted to patient care services described in this note for 32 minutes.    Jacky Kindle, MD [place] [facility] See Amion for pager If no response to pager, please call [phone] until 7pm After 7pm, Please call E-link [phone]

## 2022-04-13 NOTE — Procedures (Signed)
Tracheostomy Change Note  Patient Details:   Name: Rachael [name] DOB: Feb 26, 2000 MRN: [id number]    Airway Documentation:     Evaluation  O2 sats: stable throughout Complications: No apparent complications Patient did tolerate procedure well. Bilateral Breath Sounds: Clear, Diminished   Pt trach changed from a shiley flex 6 cuffed to a shiley flex 6 cuffed without complications. CO2 color change positive, vitals stable and volumes stable. [name] 04/13/2022, 3:18 PM

## 2022-04-13 NOTE — Progress Notes (Signed)
Palliative:  HPI: 22 y.o. female  with past medical history of depression and suicidal ideation who presented to [place] ED with sudden onset of shortness of breath and upper and lower extremity weakness. ED work-up found loss of speaking ability and hypercarbic respiratory failure and was subsequently intubated and transferred to [facility].  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 Palliative was consulted for [place] conversations.  I met today with Jillisa and her mother, York Cerise, at bedside. York Cerise is very good at lip reading for [facility] We reviewed her goals of care and plan is to continue current interventions at this time. If worsening there will need to be further conversation about her wishes. There are no plans for transition to comfort at this time although Lorene has noted to others that she does not wish to live this way long term.   Alexas was able to express her concerns with care. Her main issue is being interrupted during the night. They share how she is woken during the night to be given medications, etc and she wishes for nursing to continue to provide her care without awakening her. She expresses that sometimes she feels she expresses her wishes over and over without being listened too. York Cerise expresses that they desire care to be more gentle as Taelynn does still have sensation and they would be willing to wait longer for care in order to have more people to make movement easier and more comfortable for Tyra. They also express concern for visitation. They would like for 40 44 year old daughter to be able to visit. York Cerise expresses that Jaedin has 2 siblings but they are ages 10 and 4 that she would like to be able to visit as well.   I spoke with nursing and 2H director  Nunzio Cory who approves visits from 81 year old daughter. No clearance for siblings at this time but we will consider this into the future and considering the situation circumstances. I relayed this to [place].   All questions concerns addressed to the best of my ability. Emotional support provided. Discussed with Dr. Tacy Learn and nursing.   Exam: Alert, oriented. No distress. Trach to vent - tolerating well. HR RRR. Abd soft.   Plan: - Ongoing goals of care conversations.  - Time needed for Elli to process her circumstances and major life changes.   [place], NP Palliative Medicine Team Pager [phone] (Please see [email or web address] for schedule) Team Phone [phone]    Greater than 50%  of this time was spent counseling and coordinating care related to the above assessment and plan

## 2022-04-13 NOTE — TOC Progression Note (Addendum)
Transition of Care [facility]) - Progression Note    Patient Details  Name: Rachael [name] MRN: [id number] Date of Birth: 26-Aug-2000  Transition of Care [facility]) CM/SW [place], [place] Phone Number: 04/13/2022, 5:18 PM  Clinical Narrative:       CSW following to fax patient out for vent/snf. CSW will continue to follow and assist with patients dc planning needs.  Expected Discharge Plan: [place] Barriers to Discharge: Continued Medical Work up  Expected Discharge Plan and Services In-house Referral: Clinical Social Work Discharge Planning Services: CM Consult   Living arrangements for the past 2 months: Single Family Home                                       Social Determinants of Health (SDOH) Interventions SDOH Screenings   Food Insecurity: No Food Insecurity (03/23/2022)  Housing: Low Risk  (03/23/2022)  Transportation Needs: No Transportation Needs (03/23/2022)  Utilities: Not At Risk (03/23/2022)  Tobacco Use: Medium Risk (03/26/2022)    Readmission Risk Interventions     No data to display

## 2022-04-13 NOTE — Progress Notes (Signed)
SLP Cancellation Note  Patient Details Name: Bryleigh Bauwens MRN: [id number] DOB: 2000-05-16   Cancelled treatment:     Urban Gibson declined working with SLP today- kept eyes closed and nodded yes/no to questions.  Offered support and told her we could try again tomorrow/another day. No family at the bedside during my visit.  Cambrea Kirt L. Tivis Ringer, MA CCC/SLP Clinical Specialist - Acute Care SLP Acute Rehabilitation Services Office number [phone]        [name] 04/13/2022, 1:30 PM

## 2022-04-13 NOTE — Progress Notes (Signed)
Care order placed to minimize instructions from 2200-0600. Patient agrees to letting care team check blood sugar Q4 hours and administer medications. Declines turns, oral care, and chest PT during those hours.

## 2022-04-14 DIAGNOSIS — T50904A Poisoning by unspecified drugs, medicaments and biological substances, undetermined, initial encounter: Secondary | ICD-10-CM

## 2022-04-14 DIAGNOSIS — I6389 Other cerebral infarction: Secondary | ICD-10-CM | POA: Diagnosis not present

## 2022-04-14 DIAGNOSIS — J189 Pneumonia, unspecified organism: Secondary | ICD-10-CM | POA: Diagnosis not present

## 2022-04-14 LAB — GLUCOSE, CAPILLARY
Glucose-Capillary: 156 mg/dL — ABNORMAL HIGH (ref 70–99)
Glucose-Capillary: 145 mg/dL — ABNORMAL HIGH (ref 70–99)
Glucose-Capillary: 178 mg/dL — ABNORMAL HIGH (ref 70–99)
Glucose-Capillary: 232 mg/dL — ABNORMAL HIGH (ref 70–99)
Glucose-Capillary: 276 mg/dL — ABNORMAL HIGH (ref 70–99)
Glucose-Capillary: 182 mg/dL — ABNORMAL HIGH (ref 70–99)

## 2022-04-14 LAB — MAGNESIUM: Magnesium: 1.9 mg/dL (ref 1.7–2.4)

## 2022-04-14 MED ORDER — GUAIFENESIN 100 MG/5ML PO LIQD
10.0000 mL | ORAL | Status: DC | PRN
  Administered 2022-04-14 – 2022-04-17 (×6): 10 mL
  Filled 2022-04-14 (×6): qty 10

## 2022-04-14 MED ORDER — POTASSIUM CHLORIDE 10 MEQ/100ML IV SOLN
10.0000 meq | INTRAVENOUS | Status: AC
  Administered 2022-04-14 (×4): 10 meq via INTRAVENOUS
  Filled 2022-04-14 (×4): qty 100

## 2022-04-14 MED ORDER — SODIUM CHLORIDE 3 % IN NEBU
4.0000 mL | INHALATION_SOLUTION | Freq: Two times a day (BID) | RESPIRATORY_TRACT | Status: DC
  Administered 2022-04-14 – 2022-04-18 (×8): 4 mL via RESPIRATORY_TRACT
  Filled 2022-04-14 (×10): qty 4

## 2022-04-14 MED ORDER — IPRATROPIUM-ALBUTEROL 0.5-2.5 (3) MG/3ML IN SOLN
3.0000 mL | RESPIRATORY_TRACT | Status: DC | PRN
  Administered 2022-04-14 – 2022-04-15 (×3): 3 mL via RESPIRATORY_TRACT
  Filled 2022-04-14 (×3): qty 3

## 2022-04-14 MED ORDER — FUROSEMIDE 10 MG/ML IJ SOLN
40.0000 mg | Freq: Once | INTRAMUSCULAR | Status: AC
  Administered 2022-04-14: 40 mg via INTRAVENOUS
  Filled 2022-04-14: qty 4

## 2022-04-14 NOTE — Progress Notes (Addendum)
CSW received consult from chaplain.Patients mother had questions regarding school bus transportation assistance for patients sibilings.CSW referred patients mother to follow up with transportation or school social worker to help assist with school transportation needs.All questions answered. No further questions reported at this time.

## 2022-04-14 NOTE — Progress Notes (Signed)
This chaplain accepted the Pt. mother-[name]'s invitation for a spiritual care visit. The Pt. is sleeping.  [name] is able to share more details of the anticipated visit of the Pt. daughter-Lariah. The chaplain understands Skip Mayer is living with her father; coordination of a visit time is largely dependent on Lariah's father.  The chaplain listened reflectively to [name]'s non eventful experience of the last time [place] visited. The chaplain understands the Oda Kilts is the family's favorite movie.  The chaplain understands [name] is experiencing challenges with transportation to school for the Pt. siblings. The siblings are staying with the Pt. grandmother alongside [name]'s increased time at the hospital with the Pt. The Pt. is hopeful SW may be able to assist in communicating a need for transportation assistance.  This chaplain is available for F/U spiritual care as needed.  Chaplain Sallyanne Kuster [phone]

## 2022-04-14 NOTE — Progress Notes (Signed)
OT Cancellation Note  Patient Details Name: Rachael [name] MRN: [id number] DOB: December 15, 2000   Cancelled Treatment:    Reason Eval/Treat Not Completed: Other (comment) (Pt and mother visiting with chaplain, came by earlier and Pt  reported poor nights sleep and too tired - asked to come back later.) OT will continue to follow and re-attempt as schedule allows.   [place] 04/14/2022, 10:14 AM  Jesse Sans OTR/L [place] Office: [phone]

## 2022-04-14 NOTE — Progress Notes (Signed)
NAME:  Rachael [name], MRN:  [id number], DOB:  2000-10-22, LOS: 9 ADMISSION DATE:  03/19/2022, CONSULTATION DATE:  04/14/22 REFERRING MD:  drawbridge, CHIEF COMPLAINT:  shortness of breath and weakness   History of Present Illness:  Rachael [name] is a 22 y.o. F with history of depression and suicidal ideation who presented to [place] ED with sudden onset of shortness of breath and upper and lower extremity weakness.  History taken from Epic as patient is intubated and no family is at the bedside.  She initially had a headache and back pain prior to onset but denied abdominal pain or chest pain.  Vitals showed an initial temp of 26F, bloodwork with WBC 18k, UDS + for bezos and THC.   Her symptoms progressed to the point she wasn't speaking and ABG showed hypercarbic respiratory failure so pt was intubated.  She had some bradycardia and hypotension post-intubation, so was started on peripheral Levophed and given atropine during transport   Pertinent  Medical History   has a past medical history of Depression, Hyperlipidemia, and Kidney abscess.  Significant Hospital Events: Including procedures, antibiotic start and stop dates in addition to other pertinent events   2/16 presented to DB with all weakness and shortness of breath requiring intubation and transfer to [facility] 2/18 episodes of desaturations and hypercarbia unexplained 2/19 trach, cortrak, PICC 2/23 therapeutic bronch 2/24 Eikenella corrodens BAL. Not triggering vent  2/25 narrow abx from vanc zosyn to rocephin. Will reach out to OSH re transfer and consideration of diaphragmatic pacemaker - unfortunately no beds at [facility] and declined transfer at [facility] 2/26 fevers, abx broadened> vanc/ merrem, diuresing 2/27 chest CT, MRI brain, ongoing fevers, did not tolerate/ like PMV 2/28 trach aspirate resent as 2/26 neg.  Continued fevers.  Episode of vomiting after large bolus feeding.  Remains on 70% 2/29 ongoing fevers, working with SLP for  PMV 3/3 jump in scr, ?bladder outlet obstruction, foley replaced  3/4 creatinine has improved. When respiratory rate dropped, patient not able to initiate breath on her own.  3/7 creatinine has improved. Complaining of headache. Still having diarrhea.  3/9 [facility] refused transfer 3/12 trach change, 6 shiley cuffed   Interim History / Subjective:  Became afebrile White count started trending down Stated could not sleep overnight  Objective   Blood pressure 127/73, pulse 91, temperature 99 F (37.2 C), temperature source Oral, resp. rate (!) 22, height '5\' 7"'$  (1.702 m), weight 90 kg, SpO2 100 %.    Vent Mode: PRVC FiO2 (%):  [40 %] 40 % Set Rate:  [22 bmp] 22 bmp Vt Set:  [490 mL] 490 mL PEEP:  [5 cmH20] 5 cmH20 Plateau Pressure:  [19 cmH20-21 cmH20] 20 cmH20   Intake/Output Summary (Last 24 hours) at 04/14/2022 0738 Last data filed at 04/14/2022 0600 Gross per 24 hour  Intake 1101.94 ml  Output 4135 ml  Net -3033.06 ml    Filed Weights   04/06/22 0437 04/07/22 0413 04/09/22 0500  Weight: 99 kg 98.7 kg 90 kg   Physical exam: General: Acute on chronically ill-appearing female, in NAD HEENT: [place]/AT, eyes anicteric.  moist mucus membranes, s/p trach, no secretions, trach site clean Neuro: Alert, awake, nods head.  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 Continue aspirin and Plavix for total of 3 weeks starting 2/23 followed by aspirin only therapy Continue PT/OT Patient will need SNF with ventilator placement   Acute hypoxic and hypercarbic resp failure S/p trach Ongoing aspiration  Likely recurrent pneumonia with gram-positive  cocci in chains Continue lung protective ventilation SBT/PSV as able Per [facility] patient is not a candidate for diaphragmatic pacemaker VAP prevention bundle in place Avoid sedation Can likely stop Zosyn 3/15 (this would be an additional 5 days)  Dysphagia s/p PEG  Continue tube feeds  Neurogenic bladder Monitor intake and output Continue Foley catheter  Situational depression Anxiety Disrupted sleep cycle Tobacco dependence Continue Zoloft, melatonin, trazodone, baclofen, tizanidine, Klonopin Minimize overnight awakening/interruptions  Goals of care:  Difficult and unfortunate situation. Will need placement at SNF with vent capabilities. Family discussions with palliative care are ongoing. Palliatives assistance is greatly appreciated.   CC time: 30 min.   Rachael [name], [place] [facility] For pager details, please see AMION or use Epic chat  After 1900, please call [facility] for cross coverage needs 04/14/2022, 7:44 AM

## 2022-04-14 NOTE — Inpatient Diabetes Management (Signed)
Inpatient Diabetes Program Recommendations  AACE/ADA: New Consensus Statement on Inpatient Glycemic Control (2015)  Target Ranges:  Prepandial:   less than 140 mg/dL      Peak postprandial:   less than 180 mg/dL (1-2 hours)      Critically ill patients:  140 - 180 mg/dL   Lab Results  Component Value Date   GLUCAP 145 (H) 04/14/2022   HGBA1C 5.3 03/20/2022    Review of Glycemic Control  Latest Reference Range & Units 04/13/22 07:41 04/13/22 11:00 04/13/22 15:25 04/13/22 19:49 04/13/22 23:54 04/14/22 03:41 04/14/22 07:33  Glucose-Capillary 70 - 99 mg/dL 112 (H) 203 (H)  Novolog 6 units 231 (H)  Novolog 6 units 227 (H)  Novolog 6 units 232 (H)  Novolog 6 units 156 (H)  Novolog 4 units 145 (H)  Novolog 2 units   Current orders for Inpatient glycemic control:  Novolog 2-4 units Q4 hours  Osmolite 83 ml/hour  Inpatient Diabetes Program Recommendations:    -  May consider Novolog 2 units Q4 hours Tube Feed Coverage  Thanks,  Tama Headings RN, MSN, BC-ADM Inpatient Diabetes Coordinator Team Pager [phone] (8a-5p)

## 2022-04-14 NOTE — Progress Notes (Signed)
Nutrition Follow-up  DOCUMENTATION CODES:   Not applicable  INTERVENTION:   Tube Feeding via PEG:  Osmolite 1.5 at 83 ml/hr x 12 hours (8am to 8pm daily) Pro-source TF20 60 mL QID This provides 1734 kcals, 142 g of protein and 757 mL of free water.   Recommend giving lomotil, ordered prn and has not received since 3/10 but still with watery stool.   Continue Banatrol TF QID via tube  Noted probiotic discontinued, will not reorder at this time.   Need new weight  NUTRITION DIAGNOSIS:   Inadequate oral intake related to inability to eat as evidenced by NPO status.  Being addressed via TF   GOAL:   Patient will meet greater than or equal to 90% of their needs  Progressing   MONITOR:   Vent status, TF tolerance, I & O's, Labs  REASON FOR ASSESSMENT:   Consult Enteral/tube feeding initiation and management  ASSESSMENT:   22 yo female presents to ED on 2/16 with sudden onset SOB and sudden upper and lower extremity weakness and admitted with acute flaccid paralysis secondary to anterior spinal cord infarction and acute respiratory failure due to neuromuscular weakness requiring intubation.   PMH includes chronic prescription opioid and benzo use, depression with SI, HLD, kidney abscess. Pt is daily smoker; EtOH abuse, daily drinker-at least half pint  Pt remains on vent support via trach, A/O x 4, not candidate for SBT as pt does not have respiratory efforts  Tolerating Osmolite 1.5 at 83 ml/hr x 12 hours during the day.   SLP continues to follow and works with Pt as ale; plan to trial POs for comfort when pt ready  Pt continues with liquid stool, poor rectal tone but flexi remains in place Pt remains on banatrol TF, lomotil prn ordered but noted pt has not received since 3/10. Discussed with RN and recommend utilizing as needed.   Noted 450 mL with 1 unmeasured occurrence today; 300 mL documented in 24 hours  Noted pt received lasix 40 mg x 1 today. 2.2 L UOP thus  far today; noted 6L UOP in previous 48 hours  No pressure injuries noted per RN skin assessment  Last weight on 3/08 90 kg which is down from 98.7 kg on 3/06. Need new weight  Labs: potassium 3.4 (L), sodium 133 (L) Meds: KCl   Diet Order:   Diet Order             Diet NPO time specified  Diet effective now                   EDUCATION NEEDS:   Not appropriate for education at this time  Skin:  Skin Assessment: Skin Integrity Issues: Skin Integrity Issues:: Other (Comment) Incisions: new PEG tube Other: MASD to buttocks,  Last BM:  3/13 rectal tube-300-400 mL  Height:   Ht Readings from Last 1 Encounters:  03/22/22 '5\' 7"'$  (1.702 m)    Weight:   Wt Readings from Last 1 Encounters:  04/09/22 90 kg    Ideal Body Weight:  61.4 kg  BMI:  Body mass index is 31.08 kg/m.  Estimated Nutritional Needs:   Kcal:  1700-1900 kcals  Protein:  120-150 g  Fluid:  >/= 2L   Kerman Passey MS, RDN, LDN, CNSC Registered Dietitian 3 Clinical Nutrition RD Pager and On-Call Pager Number Located in [place]

## 2022-04-14 NOTE — Progress Notes (Signed)
Patient expresses discomfort and congestion in her chest. Frequent requests to inline suction, but no secretions removed with suction. E-link request for PRN duo-neb or guaifenesin to help. Rhonchi heard, right greater than left.

## 2022-04-14 NOTE — Progress Notes (Addendum)
Pharmacy Antibiotic Note-Follow Up  Rachael [name] is a 22 y.o. female admitted on 03/19/2022 with  aspiration PNA .  Pharmacy has been consulted for Zosyn dosing. Patient presented with spinal cord infarct, now with paraplegia, Tmax 103.10F 3/9 and last fever 3/12 100.70F that has dropped to 100.10F-no tylenol given. Vital signs stable. WBC elevated back to 15s. Trach aspirate shows normal respiratory flora with no staph or pseudomonas seen. Cr stable  Plan: Continue Zosyn 3.375 g every 8 hours extended interval infusion F/u duration and clinical progress  Height: '5\' 7"'$  (170.2 cm) Weight: 90 kg (198 lb 6.6 oz) IBW/kg (Calculated) : 61.6  Temp (24hrs), Avg:99.6 F (37.6 C), Min:98.2 F (36.8 C), Max:100.8 F (38.2 C)  Recent Labs  Lab 04/08/22 0425 04/09/22 0416 04/11/22 0343 04/12/22 0512 04/13/22 0533  WBC 31.1* 15.8* 10.2 13.9* 15.1*  CREATININE 0.66 0.72 0.62 0.56 0.49     Estimated Creatinine Clearance: 128.2 mL/min (by C-G formula based on SCr of 0.49 mg/dL).    No Known Allergies  Antimicrobials this admission:  Unasyn 2/21>2/23 Vanc 2/23>2/25 restart 2/26>3/1 Zosyn 2/23>2/25, 3/10> Ceftriaxone 2/25>2/26 Merop 2/26> 3/5 Fluconazole 3/7>3/12  Microbiology results: Extensive history throughout hospital stay with no significant organisms cultured thus far.  2/28 TA: neg 2/26 normal flora 2/24 BCx x 2 > neg 2/19 urine- neg 2/19 BAL- EIKENELLA CORRODEN  2/18 trach- normal flora 2/16 blood x2- neg 3/10 resp cx: normal flora  Thank you for involving pharmacy in this patient's care.  Sandford Craze, PharmD. [facility] Acute Care PGY-1  04/14/2022 5:46 AM   ADDENDUM - added zosyn stop date on 3/15 for 5 days total Sandford Craze, PharmD. [facility] Acute Care PGY-1  04/14/2022 8:57 AM

## 2022-04-14 NOTE — Progress Notes (Signed)
SLP Cancellation Note  Patient Details Name: Rachael [name] MRN: [id number] DOB: 05-20-2000   Cancelled treatment:       Reason Eval/Treat Not Completed: Other (comment). Visited the unit this am and chatted with RN. [name] was not at bedside at the time. SLP offered to come by and trial POs for comfort today if  [name] wanted to. Per notes pt has been wanting to sleep. Will offer again later this week. Please secure chat me any time this week if pt/mother would like to work on swallowing.   Herbie Baltimore, MA CCC-SLP  Acute Rehabilitation Services Secure Chat Preferred Office [phone]   Lynann Beaver 04/14/2022, 1:51 PM

## 2022-04-14 NOTE — Progress Notes (Signed)
eLink Physician-Brief Progress Note Patient Name: Rachael [name] DOB: 07-Mar-2000 MRN: [id number]   Date of Service  04/14/2022  HPI/Events of Note  Patient with congestion,  Rhonchi on chest auscultation.  eICU Interventions  Duonebs PRN + enteral  Guaifenesin.        Frederik Pear 04/14/2022, 3:43 AM

## 2022-04-15 DIAGNOSIS — Z7189 Other specified counseling: Secondary | ICD-10-CM | POA: Diagnosis not present

## 2022-04-15 DIAGNOSIS — J9601 Acute respiratory failure with hypoxia: Secondary | ICD-10-CM | POA: Diagnosis not present

## 2022-04-15 DIAGNOSIS — G9511 Acute infarction of spinal cord (embolic) (nonembolic): Secondary | ICD-10-CM | POA: Diagnosis not present

## 2022-04-15 DIAGNOSIS — G839 Paralytic syndrome, unspecified: Secondary | ICD-10-CM | POA: Diagnosis not present

## 2022-04-15 DIAGNOSIS — Z515 Encounter for palliative care: Secondary | ICD-10-CM | POA: Diagnosis not present

## 2022-04-15 LAB — GLUCOSE, CAPILLARY
Glucose-Capillary: 101 mg/dL — ABNORMAL HIGH (ref 70–99)
Glucose-Capillary: 120 mg/dL — ABNORMAL HIGH (ref 70–99)
Glucose-Capillary: 153 mg/dL — ABNORMAL HIGH (ref 70–99)
Glucose-Capillary: 156 mg/dL — ABNORMAL HIGH (ref 70–99)
Glucose-Capillary: 159 mg/dL — ABNORMAL HIGH (ref 70–99)

## 2022-04-15 LAB — CBC
HCT: 29.3 % — ABNORMAL LOW (ref 36.0–46.0)
Hemoglobin: 9.4 g/dL — ABNORMAL LOW (ref 12.0–15.0)
MCH: 27.2 pg (ref 26.0–34.0)
MCHC: 32.1 g/dL (ref 30.0–36.0)
MCV: 84.9 fL (ref 80.0–100.0)
Platelets: 425 10*3/uL — ABNORMAL HIGH (ref 150–400)
RBC: 3.45 MIL/uL — ABNORMAL LOW (ref 3.87–5.11)
RDW: 14.3 % (ref 11.5–15.5)
WBC: 20.6 10*3/uL — ABNORMAL HIGH (ref 4.0–10.5)
nRBC: 0 % (ref 0.0–0.2)

## 2022-04-15 LAB — BASIC METABOLIC PANEL
Anion gap: 11 (ref 5–15)
BUN: 14 mg/dL (ref 6–20)
CO2: 27 mmol/L (ref 22–32)
Calcium: 8.9 mg/dL (ref 8.9–10.3)
Chloride: 94 mmol/L — ABNORMAL LOW (ref 98–111)
Creatinine, Ser: 0.52 mg/dL (ref 0.44–1.00)
GFR, Estimated: >60 mL/min (ref >60)
Glucose, Bld: 123 mg/dL — ABNORMAL HIGH (ref 70–99)
Potassium: 3.7 mmol/L (ref 3.5–5.1)
Sodium: 132 mmol/L — ABNORMAL LOW (ref 135–145)

## 2022-04-15 LAB — PROCALCITONIN: Procalcitonin: 0.24 ng/mL

## 2022-04-15 MED ORDER — POTASSIUM CHLORIDE 20 MEQ PO PACK
20.0000 meq | PACK | Freq: Once | ORAL | Status: AC
  Administered 2022-04-15: 20 meq
  Filled 2022-04-15: qty 1

## 2022-04-15 MED ORDER — TRAZODONE HCL 50 MG PO TABS
150.0000 mg | ORAL_TABLET | Freq: Every day | ORAL | Status: DC
  Administered 2022-04-15 – 2022-04-17 (×3): 150 mg
  Filled 2022-04-15 (×3): qty 3

## 2022-04-15 MED ORDER — MORPHINE SULFATE (PF) 2 MG/ML IV SOLN
2.0000 mg | INTRAVENOUS | Status: DC | PRN
  Administered 2022-04-15 – 2022-04-18 (×8): 2 mg via INTRAVENOUS
  Filled 2022-04-15 (×8): qty 1

## 2022-04-15 MED ORDER — KETOROLAC TROMETHAMINE 15 MG/ML IJ SOLN
15.0000 mg | Freq: Four times a day (QID) | INTRAMUSCULAR | Status: DC | PRN
  Administered 2022-04-15 – 2022-04-18 (×3): 15 mg via INTRAVENOUS
  Filled 2022-04-15 (×3): qty 1

## 2022-04-15 MED ORDER — INSULIN ASPART 100 UNIT/ML IJ SOLN
2.0000 [IU] | INTRAMUSCULAR | Status: DC
  Administered 2022-04-15 – 2022-04-16 (×4): 2 [IU] via SUBCUTANEOUS

## 2022-04-15 MED ORDER — MAGNESIUM SULFATE 2 GM/50ML IV SOLN
2.0000 g | Freq: Once | INTRAVENOUS | Status: AC
  Administered 2022-04-15: 2 g via INTRAVENOUS
  Filled 2022-04-15: qty 50

## 2022-04-15 MED ORDER — LINEZOLID 600 MG/300ML IV SOLN: 600.0000 mg | Freq: Two times a day (BID) | INTRAVENOUS | Status: DC

## 2022-04-15 MED ORDER — ZOLPIDEM TARTRATE 5 MG PO TABS: 10.0000 mg | ORAL_TABLET | Freq: Every evening | ORAL | Status: DC | PRN

## 2022-04-15 NOTE — Progress Notes (Signed)
RT transported patient outside to visit family with Rnx3 per MD order. Patient tolerated well and no complications.

## 2022-04-15 NOTE — Progress Notes (Signed)
OT Cancellation Note and Discharge from OT services  Patient Details Name: Rachael [name] MRN: [id number] DOB: 23-Mar-2000   Cancelled Treatment:    Reason Eval/Treat Not Completed: Other. Pt has elected to go comfort care starting Sunday per Pt and RN. (No formal Palliative note yet in EMR) OT, who is familiar with this patient and has enjoyed getting to know her and her family offered choice of continuing OT until Sunday, or electing to stop therapy early. Education provided in benefits of therapy and what goals were to improve quality of life and for pain management through ROM, positioning, elevation of head tolerance etc. At this time patient elected to stop OT services. In respect to Pt wishes OT will sign off at this time. It has been a privilege to work with this patient and her family.  Should any changes in POC occur, please feel free to re-consult OT at that time.   Jesse Sans OTR/L Acute Rehabilitation Services Office: [place] 04/15/2022, 9:16 AM

## 2022-04-15 NOTE — Progress Notes (Signed)
Palliative:  HPI: 22 y.o. female  with past medical history of depression and suicidal ideation who presented to [place] ED with sudden onset of shortness of breath and upper and lower extremity weakness. ED work-up found loss of speaking ability and hypercarbic respiratory failure and was subsequently intubated and transferred to [facility].  She was admitted on 03/11/2022 with acute flaccid paralysis due to anterior spinal cord infarction, acute hypercapnic and hypoxic respiratory failure due to neuromuscular weakness, aspiration pneumonia, hypothermia, shock likely neurogenic and others. Palliative was consulted for [place] conversations.   I met today with Rachael [name] and Rachael [name] after discussing with RN. Rachael [name] confirms her desire to transition to comfort care and liberate from ventilator Sunday. Rachael [name] is tearful but supportive of Rachael [name]'s decision. I reviewed the process of adding medication to ensure that Rachael [name] is sleeping comfortably prior to removing ventilator. They share that they do recall the process as they have previously discussed this with my colleague - they have no questions/concerns about the process. Rachael [name] and I stepped out to speak privately while Rachael [name] rests.   Rachael [name] expresses concern for Rachael [name]'s daughter, Rachael [name]. Rachael [name] has lived with Rachael [name] (along with Rachael [name]) during her whole life. They all live together with Rachael [name]'s other 2 children. Rachael [name] has expressed her desire for Rachael [name] to have guardianship and raise Rachael [name] and this is also what Rachael [name] wishes. I provided typed note reflecting Rachael [name]'s wishes and notary will come this afternoon to notarize. Rachael [name] also shares that she wishes her other 2 children (ages 53 and 66) to visit. Rachael [name] reflects that she needs to sit them down and explain to them the situation. They know that Rachael [name] is very sick and in the hospital. I also spent time allowing Rachael [name] to express her concerns and fears and the burdens of updating everyone to the situation -  this feels very heavy to Rachael [name] as she is also navigating her own grief.   I discussed with department leadership and nursing plans and policy relating to visitation and end of life. We were able to have approval for visitation for daughter and siblings given the circumstances. I also connected with pediatric CSW, Rachael [name], who provided guidance and came to speak with Rachael [name] regarding Rachael [name]'s daughter.  All questions/concerns addressed. Emotional support provided. Discussed with nursing, PCCM, and unit leadership.   Exam: Alert, oriented. Trach to vent - tolerating. No distress. Breathing regular, unlabored. Abd soft. Quadriplegia.   Plan: - DNR - Transition to comfort care; liberate from ventilator Sunday 05-07-22. - Insomnia: PCCM has increased scheduled trazodone.   [name], NP Palliative Medicine Team Pager [phone] (Please see [email or web address] for schedule) Team Phone [phone]    Greater than 50%  of this time was spent counseling and coordinating care related to the above assessment and plan

## 2022-04-15 NOTE — Progress Notes (Signed)
This chaplain is present for F/U spiritual care. Friends are visiting with the Pt. The Pt. Mother-[name] is visiting with SW outside the room.   The chaplain understands after check in with PMT NP-Alicia and Pt. RN-Kimmel, the Pt. and family are receiving care from the medical team as needed.  This chaplain is available for F/U spiritual care.  Chaplain Sallyanne Kuster [phone]

## 2022-04-15 NOTE — Progress Notes (Signed)
Ambien given at 0520 per patient request. States she has not had any rest due to her trach discomfort and would like to sleep.

## 2022-04-15 NOTE — Progress Notes (Addendum)
NAME:  Rachael [name], MRN:  [id number], DOB:  2000-05-08, LOS: 66 ADMISSION DATE:  03/19/2022, CONSULTATION DATE:  04/15/22 REFERRING MD:  [facility], CHIEF COMPLAINT:  shortness of breath and weakness   History of Present Illness:  Rachael [name] is a 22 y.o. F with history of depression and suicidal ideation who presented to [place] ED with sudden onset of shortness of breath and upper and lower extremity weakness.  History taken from Epic as patient is intubated and no family is at the bedside.  She initially had a headache and back pain prior to onset but denied abdominal pain or chest pain.  Vitals showed an initial temp of 74F, bloodwork with WBC 18k, UDS + for bezos and THC.   Her symptoms progressed to the point she wasn't speaking and ABG showed hypercarbic respiratory failure so pt was intubated.  She had some bradycardia and hypotension post-intubation, so was started on peripheral Levophed and given atropine during transport   Pertinent  Medical History   has a past medical history of Depression, Hyperlipidemia, and Kidney abscess.  Significant Hospital Events: Including procedures, antibiotic start and stop dates in addition to other pertinent events   2/16 presented to [facility] with all weakness and shortness of breath requiring intubation and transfer to [facility] 2/18 episodes of desaturations and hypercarbia unexplained 2/19 trach, cortrak, PICC 2/23 therapeutic bronch 2/24 Eikenella corrodens BAL. Not triggering vent  2/25 narrow abx from vanc zosyn to rocephin. Will reach out to OSH re transfer and consideration of diaphragmatic pacemaker - unfortunately no beds at [facility] and declined transfer at [facility] 2/26 fevers, abx broadened> vanc/ merrem, diuresing 2/27 chest CT, MRI brain, ongoing fevers, did not tolerate/ like PMV 2/28 trach aspirate resent as 2/26 neg.  Continued fevers.  Episode of vomiting after large bolus feeding.  Remains on 70% 2/29 ongoing fevers, working with SLP for  PMV 3/3 jump in scr, ?bladder outlet obstruction, foley replaced  3/4 creatinine has improved. When respiratory rate dropped, patient not able to initiate breath on her own.  3/7 creatinine has improved. Complaining of headache. Still having diarrhea.  3/9 [facility] refused transfer 3/12 trach change, 6 shiley cuffed 3/13 pt and mother informed RN they would like to transition to comfort measures on Sunday 3/17   Interim History / Subjective:  NAEON though continues to have trouble sleeping. Per RN, pt and mother have decided to transition to comfort measures on Sunday 3/17  Objective   Blood pressure 118/67, pulse 87, temperature 98.7 F (37.1 C), temperature source Oral, resp. rate (!) 22, height '5\' 7"'$  (1.702 m), weight 90 kg, SpO2 99 %.    Vent Mode: PRVC FiO2 (%):  [40 %] 40 % Set Rate:  [22 bmp] 22 bmp Vt Set:  [490 mL] 490 mL PEEP:  [5 cmH20] 5 cmH20 Plateau Pressure:  [16 cmH20-19 cmH20] 18 cmH20   Intake/Output Summary (Last 24 hours) at 04/15/2022 0747 Last data filed at 04/15/2022 0700 Gross per 24 hour  Intake 3246.47 ml  Output 6265 ml  Net -3018.53 ml    Filed Weights   04/06/22 0437 04/07/22 0413 04/09/22 0500  Weight: 99 kg 98.7 kg 90 kg   Physical exam: General: Acute on chronically ill-appearing female, in NAD HEENT: [place]/AT, eyes anicteric.  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 I did not awaken them this AM as they have not rested much and RN has requested we not interrupt current sleep. Will update our PMT team on the above and we will coordinate transition to comfort as per pt and family wishes Sunday 3/17. For now, continue supportive measures as below.  Continue aspirin and Plavix for total of 3 weeks starting 2/23 followed by aspirin only therapy Continue lung protective ventilation Can likely stop Zosyn 3/15 (this would be an additional 5 days) Continue tube feeds Continue Zoloft, melatonin, trazodone (increased dose 3/14 given trouble sleeping), baclofen, tizanidine, Klonopin, Ambien. Minimize overnight awakening/interruptions  CC time: N/A   Montey Hora, PA - C [facility] For pager details, please see AMION or use Epic chat  After 1900, please call [place] for cross coverage needs 04/15/2022, 7:47 AM

## 2022-04-16 ENCOUNTER — Inpatient Hospital Stay (HOSPITAL_COMMUNITY): Payer: Medicaid Other

## 2022-04-16 DIAGNOSIS — I469 Cardiac arrest, cause unspecified: Secondary | ICD-10-CM

## 2022-04-16 DIAGNOSIS — R4182 Altered mental status, unspecified: Secondary | ICD-10-CM | POA: Diagnosis not present

## 2022-04-16 DIAGNOSIS — J189 Pneumonia, unspecified organism: Secondary | ICD-10-CM | POA: Diagnosis not present

## 2022-04-16 DIAGNOSIS — I6389 Other cerebral infarction: Secondary | ICD-10-CM | POA: Diagnosis not present

## 2022-04-16 DIAGNOSIS — Z529 Donor of unspecified organ or tissue: Secondary | ICD-10-CM

## 2022-04-16 LAB — ECHOCARDIOGRAM COMPLETE
AR max vel: 2.41 cm2
AV Area VTI: 2.24 cm2
AV Area mean vel: 2.25 cm2
AV Mean grad: 3 mmHg
AV Peak grad: 5.7 mmHg
Ao pk vel: 1.19 m/s
Area-P 1/2: 3.63 cm2
Height: 67 in
S' Lateral: 2.9 cm
Weight: 3174.62 oz

## 2022-04-16 LAB — POCT I-STAT 7, (LYTES, BLD GAS, ICA,H+H)
Acid-Base Excess: 4 mmol/L — ABNORMAL HIGH (ref 0.0–2.0)
Bicarbonate: 30.8 mmol/L — ABNORMAL HIGH (ref 20.0–28.0)
Calcium, Ion: 1.26 mmol/L (ref 1.15–1.40)
HCT: 28 % — ABNORMAL LOW (ref 36.0–46.0)
Hemoglobin: 9.5 g/dL — ABNORMAL LOW (ref 12.0–15.0)
O2 Saturation: 99 %
Potassium: 3.6 mmol/L (ref 3.5–5.1)
Sodium: 133 mmol/L — ABNORMAL LOW (ref 135–145)
TCO2: 33 mmol/L — ABNORMAL HIGH (ref 22–32)
pCO2 arterial: 56.5 mmHg — ABNORMAL HIGH (ref 32–48)
pH, Arterial: 7.345 — ABNORMAL LOW (ref 7.35–7.45)
pO2, Arterial: 177 mmHg — ABNORMAL HIGH (ref 83–108)

## 2022-04-16 LAB — COMPREHENSIVE METABOLIC PANEL
ALT: 226 U/L — ABNORMAL HIGH (ref 0–44)
AST: 43 U/L — ABNORMAL HIGH (ref 15–41)
Albumin: 1.9 g/dL — ABNORMAL LOW (ref 3.5–5.0)
Alkaline Phosphatase: 111 U/L (ref 38–126)
Anion gap: 8 (ref 5–15)
BUN: 13 mg/dL (ref 6–20)
CO2: 29 mmol/L (ref 22–32)
Calcium: 8.6 mg/dL — ABNORMAL LOW (ref 8.9–10.3)
Chloride: 95 mmol/L — ABNORMAL LOW (ref 98–111)
Creatinine, Ser: 0.45 mg/dL (ref 0.44–1.00)
GFR, Estimated: >60 mL/min (ref >60)
Glucose, Bld: 137 mg/dL — ABNORMAL HIGH (ref 70–99)
Potassium: 3.6 mmol/L (ref 3.5–5.1)
Sodium: 132 mmol/L — ABNORMAL LOW (ref 135–145)
Total Bilirubin: 0.3 mg/dL (ref 0.3–1.2)
Total Protein: 5.9 g/dL — ABNORMAL LOW (ref 6.5–8.1)

## 2022-04-16 LAB — URINALYSIS, ROUTINE W REFLEX MICROSCOPIC
Bilirubin Urine: NEGATIVE
Glucose, UA: NEGATIVE mg/dL
Hgb urine dipstick: NEGATIVE
Ketones, ur: NEGATIVE mg/dL
Leukocytes,Ua: NEGATIVE
Nitrite: NEGATIVE
Protein, ur: NEGATIVE mg/dL
Specific Gravity, Urine: 1.021 (ref 1.005–1.030)
pH: 5 (ref 5.0–8.0)

## 2022-04-16 LAB — CBC
HCT: 26.7 % — ABNORMAL LOW (ref 36.0–46.0)
Hemoglobin: 8.7 g/dL — ABNORMAL LOW (ref 12.0–15.0)
MCH: 27.9 pg (ref 26.0–34.0)
MCHC: 32.6 g/dL (ref 30.0–36.0)
MCV: 85.6 fL (ref 80.0–100.0)
Platelets: 382 10*3/uL (ref 150–400)
RBC: 3.12 MIL/uL — ABNORMAL LOW (ref 3.87–5.11)
RDW: 14.2 % (ref 11.5–15.5)
WBC: 16.4 10*3/uL — ABNORMAL HIGH (ref 4.0–10.5)
nRBC: 0 % (ref 0.0–0.2)

## 2022-04-16 LAB — PROTIME-INR
INR: 1 (ref 0.8–1.2)
Prothrombin Time: 13.5 seconds (ref 11.4–15.2)

## 2022-04-16 LAB — CK: Total CK: 42 U/L (ref 38–234)

## 2022-04-16 LAB — LIPASE, BLOOD: Lipase: 77 U/L — ABNORMAL HIGH (ref 11–51)

## 2022-04-16 LAB — GLUCOSE, CAPILLARY: Glucose-Capillary: 112 mg/dL — ABNORMAL HIGH (ref 70–99)

## 2022-04-16 LAB — AMYLASE: Amylase: 37 U/L (ref 28–100)

## 2022-04-16 LAB — TROPONIN I (HIGH SENSITIVITY): Troponin I (High Sensitivity): 5 ng/L (ref <18)

## 2022-04-16 LAB — APTT: aPTT: 25 seconds (ref 24–36)

## 2022-04-16 LAB — PHOSPHORUS: Phosphorus: 5 mg/dL — ABNORMAL HIGH (ref 2.5–4.6)

## 2022-04-16 LAB — BILIRUBIN, DIRECT: Bilirubin, Direct: <0.1 mg/dL (ref 0.0–0.2)

## 2022-04-16 LAB — MAGNESIUM: Magnesium: 1.9 mg/dL (ref 1.7–2.4)

## 2022-04-16 MED ORDER — ROCURONIUM BROMIDE 50 MG/5ML IV SOLN: 100.0000 mg | Freq: Once | INTRAVENOUS | Status: AC

## 2022-04-16 MED ORDER — MIDAZOLAM HCL 2 MG/2ML IJ SOLN
1.0000 mg | Freq: Once | INTRAMUSCULAR | Status: AC
  Administered 2022-04-16: 2 mg via INTRAVENOUS
  Filled 2022-04-16: qty 4

## 2022-04-16 MED ORDER — SODIUM CHLORIDE 0.9 % IV SOLN: INTRAVENOUS | Status: DC | PRN

## 2022-04-16 MED ORDER — IOHEXOL 9 MG/ML PO SOLN: 500.0000 mL | ORAL | Status: AC

## 2022-04-16 MED ORDER — ROCURONIUM BROMIDE 10 MG/ML (PF) SYRINGE
PREFILLED_SYRINGE | INTRAVENOUS | Status: AC
  Administered 2022-04-16: 100 mg via INTRAVENOUS
  Filled 2022-04-16: qty 10

## 2022-04-16 MED ORDER — ETOMIDATE 2 MG/ML IV SOLN
INTRAVENOUS | Status: AC
  Administered 2022-04-16: 20 mg via INTRAVENOUS
  Filled 2022-04-16: qty 10

## 2022-04-16 MED ORDER — ETOMIDATE 2 MG/ML IV SOLN: 20.0000 mg | Freq: Once | INTRAVENOUS | Status: AC

## 2022-04-16 MED ORDER — IOHEXOL 9 MG/ML PO SOLN: 500.0000 mL | ORAL | Status: DC

## 2022-04-16 NOTE — Progress Notes (Signed)
Palliative:  HPI: 22 y.o. female  with past medical history of depression and suicidal ideation who presented to [facility] ED with sudden onset of shortness of breath and upper and lower extremity weakness. ED work-up found loss of speaking ability and hypercarbic respiratory failure and was subsequently intubated and transferred to [facility].  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 Palliative was consulted for [place] conversations.   I discussed with RN and PCCM. I provided mother, [name], with resources from [place] and bereavement services/resources. [name] still has some calls to make that she is not looking forward to making. I encouraged her to call the people she needs to but it is okay to designate others to distribute information amongst family and friends - she reports that she has been doing this but there are certain people she needs to speak to one on one. [name] shares that Temima has a close friend that is coming Sunday afternoon so they do not want to begin comfort medications prior to this visit - Alvetta would like to visit with this friend. I explained to [name] that we will begin the medications when they are ready but that we will need to completely ensure comfort before ventilator is removed and she understands and agrees. I reviewed unrestricted visitation with [name] and nursing. Enrica has been able to sleep well last night without interruption. I did not disturb her this morning and will allow her energy to be spent on friends/family. I reviewed chart and minimized medications/interventions.  All questions/concerns addressed. Emotional support provided.   Exam: Resting comfortably. Trach to vent. No distress.   Plan: - DNR - Plans for extubation to comfort care  05/01/22. - Recommend morphine and propofol infusions with scheduled benzo support to ensure complete comfort prior to extubation.   25 min  Vinie Sill, NP Palliative Medicine Team Pager [phone] (Please see [email or web address] for schedule) Team Phone [phone]    Greater than 50%  of this time was spent counseling and coordinating care related to the above assessment and plan

## 2022-04-16 NOTE — Progress Notes (Signed)
   04/16/22 1630  Spiritual Encounters  Type of Visit Follow up  Care provided to: Family   Chaplain stopped by 2h to check on PT's mother and how she is holding up.  She was with friends.  Chaplain understands that a mvoe to comfort care is planned for Sunday and arranged with PT's mother to meet with her for prayer Sunday morning.

## 2022-04-16 NOTE — Procedures (Signed)
Bronchoscopy Procedure Note  Rachael [name]  [id number]  06-05-2000  Date:04/16/22  Time:4:29 PM   Provider Performing:Greysen Devino   Procedure(s):  Flexible bronchoscopy with bronchial alveolar lavage [id number])  Indication(s) Lung evaluation, requested by [facility]  Consent Risks of the procedure as well as the alternatives and risks of each were explained to the patient and/or caregiver.  Consent for the procedure was obtained and is signed in the bedside chart  Anesthesia Etomidate and droplet    Time Out Verified patient identification, verified procedure, site/side was marked, verified correct patient position, special equipment/implants available, medications/allergies/relevant history reviewed, required imaging and test results available.   Sterile Technique Usual hand hygiene, masks, gowns, and gloves were used   Procedure Description Bronchoscope advanced through tracheostomy tube and into airway.  Airways were examined down to subsegmental level with findings noted below.   Following diagnostic evaluation, BAL(s) performed in right lower lobe and left lower lobe with normal saline and return of with mucus fluid  Findings: Small amount of thin secretions noted throughout the respiratory tree, few suction trauma on the mucosa noted, otherwise healthy mucosa   Complications/Tolerance None; patient tolerated the procedure well. Chest X-ray is not needed post procedure.   EBL Minimal   Specimen(s) BAL

## 2022-04-16 NOTE — Progress Notes (Signed)
ABG @ 1700 on 100% FiO2: 7.34/56.5/177/30.8 with a sat of 99%.

## 2022-04-16 NOTE — Progress Notes (Signed)
PT Cancellation Note  Patient Details Name: Rachael [name] MRN: [id number] DOB: [birth year]   Cancelled Treatment:    Reason Eval/Treat Not Completed: Other (comment) (Pt electing to stop P.T. services in plan for comfort care. Will sign off)   [name] [name] 04/16/2022, 7:36 AM [place] Office: [phone]

## 2022-04-16 NOTE — Progress Notes (Signed)
Nutrition Brief Note  Chart reviewed. Pt and family have decided to transition to comfort care on Sunday. No further nutrition interventions planned at this time. TF orders active, decision to continue TF vs stop to be decision of patient Please re-consult as needed.   Kerman Passey MS, RDN, LDN, CNSC Registered Dietitian 3 Clinical Nutrition RD Pager and On-Call Pager Number Located in [place]

## 2022-04-16 NOTE — Progress Notes (Signed)
RT transported patient from 2H03 to CT and back with Rnx2. No complications.

## 2022-04-16 NOTE — Progress Notes (Signed)
NAME:  Rachael [name], MRN:  [id number], DOB:  04-Nov-2000, LOS: 74 ADMISSION DATE:  03/24/2022, CONSULTATION DATE:  04/16/22 REFERRING MD:  [facility]bridge, CHIEF COMPLAINT:  shortness of breath and weakness   History of Present Illness:  Rachael [name] is a 22 y.o. F with history of depression and suicidal ideation who presented to [place] ED with sudden onset of shortness of breath and upper and lower extremity weakness.  History taken from Epic as patient is intubated and no family is at the bedside.  She initially had a headache and back pain prior to onset but denied abdominal pain or chest pain.  Vitals showed an initial temp of 29F, bloodwork with WBC 18k, UDS + for bezos and THC.   Her symptoms progressed to the point she wasn't speaking and ABG showed hypercarbic respiratory failure so pt was intubated.  She had some bradycardia and hypotension post-intubation, so was started on peripheral Levophed and given atropine during transport   Pertinent  Medical History   has a past medical history of Depression, Hyperlipidemia, and Kidney abscess.  Significant Hospital Events: Including procedures, antibiotic start and stop dates in addition to other pertinent events   2/16 presented to [facility] with all weakness and shortness of breath requiring intubation and transfer to [facility] 2/18 episodes of desaturations and hypercarbia unexplained 2/19 trach, cortrak, PICC 2/23 therapeutic bronch 2/24 Eikenella corrodens BAL. Not triggering vent  2/25 narrow abx from vanc zosyn to rocephin. Will reach out to OSH re transfer and consideration of diaphragmatic pacemaker - unfortunately no beds at [facility] and declined transfer at [facility] 2/26 fevers, abx broadened> vanc/ merrem, diuresing 2/27 chest CT, MRI brain, ongoing fevers, did not tolerate/ like PMV 2/28 trach aspirate resent as 2/26 neg.  Continued fevers.  Episode of vomiting after large bolus feeding.  Remains on 70% 2/29 ongoing fevers, working with SLP for  PMV 3/3 jump in scr, ?bladder outlet obstruction, foley replaced  3/4 creatinine has improved. When respiratory rate dropped, patient not able to initiate breath on her own.  3/7 creatinine has improved. Complaining of headache. Still having diarrhea.  3/9 [facility] refused transfer 3/12 trach change, 6 shiley cuffed 3/13 pt and mother informed RN they would like to transition to comfort measures on Sunday May 03, 2022   Interim History / Subjective:  NAEON, finally slept a bit better overnight it seems Resting comfortably this AM  Objective   Blood pressure 114/70, pulse 76, temperature 98 F (36.7 C), temperature source Oral, resp. rate (!) 22, height 5\' 7"  (1.702 m), weight 90 kg, SpO2 100 %.    Vent Mode: PRVC FiO2 (%):  [40 %] 40 % Set Rate:  [22 bmp] 22 bmp Vt Set:  [490 mL] 490 mL PEEP:  [5 cmH20] 5 cmH20 Plateau Pressure:  [15 cmH20-17 cmH20] 17 cmH20   Intake/Output Summary (Last 24 hours) at 04/16/2022 0734 Last data filed at 04/16/2022 0400 Gross per 24 hour  Intake 500.29 ml  Output 2033 ml  Net -1532.71 ml    Filed Weights   04/06/22 0437 04/07/22 0413 04/09/22 0500  Weight: 99 kg 98.7 kg 90 kg   Physical exam: General: Acute on chronically ill-appearing female, in NAD HEENT: [place]/AT, eyes anicteric.  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 I did not awaken them this AM as they have not rested much and RN has requested we not interrupt current sleep. Will update our PMT team on the above and we will coordinate transition to comfort as per pt and family wishes 2022/05/18. For now, continue supportive measures as below.  Continue aspirin. Plavix stop today Continue lung protective ventilation Zosyn stop today Continue tube feeds Continue Zoloft, melatonin, trazodone (increased dose 3/14 given trouble sleeping), baclofen, tizanidine, Klonopin, Ambien. Minimize overnight awakening/interruptions  CC time: N/A   Montey Hora, PA - C  Pulmonary & Critical Care Medicine For pager details, please see AMION or use Epic chat  After 1900, please call [place] for cross coverage needs 04/16/2022, 7:34 AM

## 2022-04-16 NOTE — Procedures (Addendum)
Arterial Catheter Insertion Procedure Note  Decklyn Senter  [id number]  November 02, 2000  Date:04/16/22  Time:4:31 PM    Provider Performing: Jacky Kindle    Procedure: Insertion of Arterial Line [phone]) with US guidance [id number])   Indication(s) Blood pressure monitoring and/or need for frequent ABGs  Consent Risks of the procedure as well as the alternatives and risks of each were explained to the patient and/or caregiver.  Consent for the procedure was obtained and is signed in the bedside chart  Anesthesia None   Time Out Verified patient identification, verified procedure, site/side was marked, verified correct patient position, special equipment/implants available, medications/allergies/relevant history reviewed, required imaging and test results available.   Sterile Technique Maximal sterile technique including full sterile barrier drape, hand hygiene, sterile gown, sterile gloves, mask, hair covering, sterile ultrasound probe cover (if used).   Procedure Description Area of catheter insertion was cleaned with chlorhexidine and draped in sterile fashion. With real-time ultrasound guidance an arterial catheter was placed into the left  Axillary  artery.  Appropriate arterial tracings confirmed on monitor.     Complications/Tolerance None; patient tolerated the procedure well.   EBL Minimal   Specimen(s) None

## 2022-04-17 ENCOUNTER — Encounter (HOSPITAL_COMMUNITY): Payer: Self-pay | Admitting: Registered Nurse

## 2022-04-17 ENCOUNTER — Inpatient Hospital Stay (HOSPITAL_COMMUNITY): Payer: Medicaid Other

## 2022-04-17 DIAGNOSIS — E8729 Other acidosis: Secondary | ICD-10-CM

## 2022-04-17 LAB — POCT I-STAT 7, (LYTES, BLD GAS, ICA,H+H)
Acid-Base Excess: 5 mmol/L — ABNORMAL HIGH (ref 0.0–2.0)
Acid-Base Excess: 3 mmol/L — ABNORMAL HIGH (ref 0.0–2.0)
Acid-Base Excess: 5 mmol/L — ABNORMAL HIGH (ref 0.0–2.0)
Bicarbonate: 28.6 mmol/L — ABNORMAL HIGH (ref 20.0–28.0)
Bicarbonate: 26.2 mmol/L (ref 20.0–28.0)
Bicarbonate: 28.9 mmol/L — ABNORMAL HIGH (ref 20.0–28.0)
Calcium, Ion: 1.23 mmol/L (ref 1.15–1.40)
Calcium, Ion: 1.23 mmol/L (ref 1.15–1.40)
Calcium, Ion: 1.22 mmol/L (ref 1.15–1.40)
HCT: 27 % — ABNORMAL LOW (ref 36.0–46.0)
HCT: 26 % — ABNORMAL LOW (ref 36.0–46.0)
HCT: 25 % — ABNORMAL LOW (ref 36.0–46.0)
Hemoglobin: 9.2 g/dL — ABNORMAL LOW (ref 12.0–15.0)
Hemoglobin: 8.8 g/dL — ABNORMAL LOW (ref 12.0–15.0)
Hemoglobin: 8.5 g/dL — ABNORMAL LOW (ref 12.0–15.0)
O2 Saturation: 100 %
O2 Saturation: 100 %
O2 Saturation: 100 %
Patient temperature: 100.3
Patient temperature: 99.7
Patient temperature: 99.5
Potassium: 3.6 mmol/L (ref 3.5–5.1)
Potassium: 4.1 mmol/L (ref 3.5–5.1)
Potassium: 4.4 mmol/L (ref 3.5–5.1)
Sodium: 131 mmol/L — ABNORMAL LOW (ref 135–145)
Sodium: 131 mmol/L — ABNORMAL LOW (ref 135–145)
Sodium: 130 mmol/L — ABNORMAL LOW (ref 135–145)
TCO2: 30 mmol/L (ref 22–32)
TCO2: 27 mmol/L (ref 22–32)
TCO2: 30 mmol/L (ref 22–32)
pCO2 arterial: 40.6 mmHg (ref 32–48)
pCO2 arterial: 34 mmHg (ref 32–48)
pCO2 arterial: 39.3 mmHg (ref 32–48)
pH, Arterial: 7.459 — ABNORMAL HIGH (ref 7.35–7.45)
pH, Arterial: 7.497 — ABNORMAL HIGH (ref 7.35–7.45)
pH, Arterial: 7.476 — ABNORMAL HIGH (ref 7.35–7.45)
pO2, Arterial: 409 mmHg — ABNORMAL HIGH (ref 83–108)
pO2, Arterial: 427 mmHg — ABNORMAL HIGH (ref 83–108)
pO2, Arterial: 494 mmHg — ABNORMAL HIGH (ref 83–108)

## 2022-04-17 LAB — COMPREHENSIVE METABOLIC PANEL
ALT: 206 U/L — ABNORMAL HIGH (ref 0–44)
ALT: 188 U/L — ABNORMAL HIGH (ref 0–44)
ALT: 168 U/L — ABNORMAL HIGH (ref 0–44)
ALT: 144 U/L — ABNORMAL HIGH (ref 0–44)
AST: 35 U/L (ref 15–41)
AST: 30 U/L (ref 15–41)
AST: 27 U/L (ref 15–41)
AST: 22 U/L (ref 15–41)
Albumin: 2 g/dL — ABNORMAL LOW (ref 3.5–5.0)
Albumin: 2.1 g/dL — ABNORMAL LOW (ref 3.5–5.0)
Albumin: 2 g/dL — ABNORMAL LOW (ref 3.5–5.0)
Albumin: 2.6 g/dL — ABNORMAL LOW (ref 3.5–5.0)
Alkaline Phosphatase: 112 U/L (ref 38–126)
Alkaline Phosphatase: 106 U/L (ref 38–126)
Alkaline Phosphatase: 101 U/L (ref 38–126)
Alkaline Phosphatase: 101 U/L (ref 38–126)
Anion gap: 9 (ref 5–15)
Anion gap: 12 (ref 5–15)
Anion gap: 12 (ref 5–15)
Anion gap: 12 (ref 5–15)
BUN: 14 mg/dL (ref 6–20)
BUN: 13 mg/dL (ref 6–20)
BUN: 12 mg/dL (ref 6–20)
BUN: 14 mg/dL (ref 6–20)
CO2: 28 mmol/L (ref 22–32)
CO2: 26 mmol/L (ref 22–32)
CO2: 25 mmol/L (ref 22–32)
CO2: 26 mmol/L (ref 22–32)
Calcium: 8.7 mg/dL — ABNORMAL LOW (ref 8.9–10.3)
Calcium: 8.9 mg/dL (ref 8.9–10.3)
Calcium: 8.9 mg/dL (ref 8.9–10.3)
Calcium: 9.2 mg/dL (ref 8.9–10.3)
Chloride: 94 mmol/L — ABNORMAL LOW (ref 98–111)
Chloride: 94 mmol/L — ABNORMAL LOW (ref 98–111)
Chloride: 92 mmol/L — ABNORMAL LOW (ref 98–111)
Chloride: 93 mmol/L — ABNORMAL LOW (ref 98–111)
Creatinine, Ser: 0.47 mg/dL (ref 0.44–1.00)
Creatinine, Ser: 0.5 mg/dL (ref 0.44–1.00)
Creatinine, Ser: 0.54 mg/dL (ref 0.44–1.00)
Creatinine, Ser: 0.6 mg/dL (ref 0.44–1.00)
GFR, Estimated: >60 mL/min (ref >60)
GFR, Estimated: >60 mL/min (ref >60)
GFR, Estimated: >60 mL/min (ref >60)
GFR, Estimated: >60 mL/min (ref >60)
Glucose, Bld: 111 mg/dL — ABNORMAL HIGH (ref 70–99)
Glucose, Bld: 113 mg/dL — ABNORMAL HIGH (ref 70–99)
Glucose, Bld: 102 mg/dL — ABNORMAL HIGH (ref 70–99)
Glucose, Bld: 91 mg/dL (ref 70–99)
Potassium: 3.9 mmol/L (ref 3.5–5.1)
Potassium: 3.6 mmol/L (ref 3.5–5.1)
Potassium: 4.3 mmol/L (ref 3.5–5.1)
Potassium: 4.5 mmol/L (ref 3.5–5.1)
Sodium: 131 mmol/L — ABNORMAL LOW (ref 135–145)
Sodium: 132 mmol/L — ABNORMAL LOW (ref 135–145)
Sodium: 129 mmol/L — ABNORMAL LOW (ref 135–145)
Sodium: 131 mmol/L — ABNORMAL LOW (ref 135–145)
Total Bilirubin: 0.5 mg/dL (ref 0.3–1.2)
Total Bilirubin: 0.4 mg/dL (ref 0.3–1.2)
Total Bilirubin: 0.4 mg/dL (ref 0.3–1.2)
Total Bilirubin: 0.7 mg/dL (ref 0.3–1.2)
Total Protein: 6.2 g/dL — ABNORMAL LOW (ref 6.5–8.1)
Total Protein: 5.9 g/dL — ABNORMAL LOW (ref 6.5–8.1)
Total Protein: 5.9 g/dL — ABNORMAL LOW (ref 6.5–8.1)
Total Protein: 6.1 g/dL — ABNORMAL LOW (ref 6.5–8.1)

## 2022-04-17 LAB — CBC
HCT: 23.6 % — ABNORMAL LOW (ref 36.0–46.0)
HCT: 25.6 % — ABNORMAL LOW (ref 36.0–46.0)
HCT: 26.8 % — ABNORMAL LOW (ref 36.0–46.0)
HCT: 26.8 % — ABNORMAL LOW (ref 36.0–46.0)
Hemoglobin: 7.8 g/dL — ABNORMAL LOW (ref 12.0–15.0)
Hemoglobin: 8.5 g/dL — ABNORMAL LOW (ref 12.0–15.0)
Hemoglobin: 8.6 g/dL — ABNORMAL LOW (ref 12.0–15.0)
Hemoglobin: 8.8 g/dL — ABNORMAL LOW (ref 12.0–15.0)
MCH: 27.6 pg (ref 26.0–34.0)
MCH: 27.8 pg (ref 26.0–34.0)
MCH: 28.1 pg (ref 26.0–34.0)
MCH: 28.1 pg (ref 26.0–34.0)
MCHC: 32.1 g/dL (ref 30.0–36.0)
MCHC: 32.8 g/dL (ref 30.0–36.0)
MCHC: 33.1 g/dL (ref 30.0–36.0)
MCHC: 33.2 g/dL (ref 30.0–36.0)
MCV: 84.5 fL (ref 80.0–100.0)
MCV: 84.5 fL (ref 80.0–100.0)
MCV: 84.9 fL (ref 80.0–100.0)
MCV: 85.9 fL (ref 80.0–100.0)
Platelets: 319 10*3/uL (ref 150–400)
Platelets: 349 10*3/uL (ref 150–400)
Platelets: 363 10*3/uL (ref 150–400)
Platelets: 364 10*3/uL (ref 150–400)
RBC: 2.78 MIL/uL — ABNORMAL LOW (ref 3.87–5.11)
RBC: 3.03 MIL/uL — ABNORMAL LOW (ref 3.87–5.11)
RBC: 3.12 MIL/uL — ABNORMAL LOW (ref 3.87–5.11)
RBC: 3.17 MIL/uL — ABNORMAL LOW (ref 3.87–5.11)
RDW: 14.3 % (ref 11.5–15.5)
RDW: 14.3 % (ref 11.5–15.5)
RDW: 14.4 % (ref 11.5–15.5)
RDW: 14.5 % (ref 11.5–15.5)
WBC: 13.3 10*3/uL — ABNORMAL HIGH (ref 4.0–10.5)
WBC: 15.4 10*3/uL — ABNORMAL HIGH (ref 4.0–10.5)
WBC: 15.4 10*3/uL — ABNORMAL HIGH (ref 4.0–10.5)
WBC: 16.2 10*3/uL — ABNORMAL HIGH (ref 4.0–10.5)
nRBC: 0 % (ref 0.0–0.2)
nRBC: 0 % (ref 0.0–0.2)
nRBC: 0 % (ref 0.0–0.2)
nRBC: 0 % (ref 0.0–0.2)

## 2022-04-17 LAB — APTT
aPTT: 25 seconds (ref 24–36)
aPTT: 27 seconds (ref 24–36)
aPTT: 27 seconds (ref 24–36)
aPTT: 28 seconds (ref 24–36)

## 2022-04-17 LAB — URINALYSIS, ROUTINE W REFLEX MICROSCOPIC
Bilirubin Urine: NEGATIVE
Glucose, UA: NEGATIVE mg/dL
Hgb urine dipstick: NEGATIVE
Ketones, ur: NEGATIVE mg/dL
Nitrite: NEGATIVE
Protein, ur: 30 mg/dL — AB
Specific Gravity, Urine: 1.016 (ref 1.005–1.030)
WBC, UA: >50 WBC/hpf (ref 0–5)
pH: 6 (ref 5.0–8.0)

## 2022-04-17 LAB — BILIRUBIN, DIRECT
Bilirubin, Direct: <0.1 mg/dL (ref 0.0–0.2)
Bilirubin, Direct: <0.1 mg/dL (ref 0.0–0.2)
Bilirubin, Direct: <0.1 mg/dL (ref 0.0–0.2)
Bilirubin, Direct: <0.1 mg/dL (ref 0.0–0.2)

## 2022-04-17 LAB — LIPASE, BLOOD
Lipase: 60 U/L — ABNORMAL HIGH (ref 11–51)
Lipase: 64 U/L — ABNORMAL HIGH (ref 11–51)
Lipase: 70 U/L — ABNORMAL HIGH (ref 11–51)
Lipase: 64 U/L — ABNORMAL HIGH (ref 11–51)

## 2022-04-17 LAB — MAGNESIUM
Magnesium: 1.9 mg/dL (ref 1.7–2.4)
Magnesium: 1.7 mg/dL (ref 1.7–2.4)
Magnesium: 1.8 mg/dL (ref 1.7–2.4)
Magnesium: 1.8 mg/dL (ref 1.7–2.4)

## 2022-04-17 LAB — PROTIME-INR
INR: 1.1 (ref 0.8–1.2)
INR: 1.1 (ref 0.8–1.2)
INR: 1.1 (ref 0.8–1.2)
INR: 1.2 (ref 0.8–1.2)
Prothrombin Time: 14.1 seconds (ref 11.4–15.2)
Prothrombin Time: 14.3 seconds (ref 11.4–15.2)
Prothrombin Time: 14.5 seconds (ref 11.4–15.2)
Prothrombin Time: 14.6 seconds (ref 11.4–15.2)

## 2022-04-17 LAB — AMYLASE
Amylase: 35 U/L (ref 28–100)
Amylase: 40 U/L (ref 28–100)
Amylase: 40 U/L (ref 28–100)
Amylase: 40 U/L (ref 28–100)

## 2022-04-17 LAB — CK
Total CK: 37 U/L — ABNORMAL LOW (ref 38–234)
Total CK: 33 U/L — ABNORMAL LOW (ref 38–234)
Total CK: 41 U/L (ref 38–234)
Total CK: 49 U/L (ref 38–234)

## 2022-04-17 LAB — TROPONIN I (HIGH SENSITIVITY)
Troponin I (High Sensitivity): 4 ng/L (ref <18)
Troponin I (High Sensitivity): 4 ng/L (ref <18)
Troponin I (High Sensitivity): 4 ng/L (ref <18)
Troponin I (High Sensitivity): 3 ng/L (ref <18)

## 2022-04-17 LAB — PHOSPHORUS
Phosphorus: 4.4 mg/dL (ref 2.5–4.6)
Phosphorus: 4.1 mg/dL (ref 2.5–4.6)
Phosphorus: 4.5 mg/dL (ref 2.5–4.6)
Phosphorus: 6.1 mg/dL — ABNORMAL HIGH (ref 2.5–4.6)

## 2022-04-17 LAB — LACTIC ACID, PLASMA
Lactic Acid, Venous: 1.4 mmol/L (ref 0.5–1.9)
Lactic Acid, Venous: 0.7 mmol/L (ref 0.5–1.9)

## 2022-04-17 LAB — HEMOGLOBIN A1C
Hgb A1c MFr Bld: 6.4 % — ABNORMAL HIGH (ref 4.8–5.6)
Mean Plasma Glucose: 137 mg/dL

## 2022-04-17 LAB — PREPARE RBC (CROSSMATCH)

## 2022-04-17 MED ORDER — FUROSEMIDE 10 MG/ML IJ SOLN
INTRAMUSCULAR | Status: AC
  Administered 2022-04-17: 10 mg via INTRAVENOUS
  Filled 2022-04-17: qty 2

## 2022-04-17 MED ORDER — DICLOFENAC SODIUM 1 % EX GEL
2.0000 g | Freq: Four times a day (QID) | CUTANEOUS | Status: DC | PRN
  Administered 2022-04-17: 2 g via TOPICAL
  Filled 2022-04-17: qty 100

## 2022-04-17 MED ORDER — IPRATROPIUM-ALBUTEROL 0.5-2.5 (3) MG/3ML IN SOLN
3.0000 mL | Freq: Two times a day (BID) | RESPIRATORY_TRACT | Status: DC
  Administered 2022-04-17 – 2022-04-18 (×2): 3 mL via RESPIRATORY_TRACT
  Filled 2022-04-17 (×2): qty 3

## 2022-04-17 MED ORDER — ALBUMIN HUMAN 25 % IV SOLN: 12.5000 g | Freq: Once | INTRAVENOUS | Status: AC

## 2022-04-17 MED ORDER — IPRATROPIUM-ALBUTEROL 0.5-2.5 (3) MG/3ML IN SOLN
3.0000 mL | Freq: Once | RESPIRATORY_TRACT | Status: AC
  Administered 2022-04-17: 3 mL via RESPIRATORY_TRACT
  Filled 2022-04-17: qty 3

## 2022-04-17 MED ORDER — SODIUM CHLORIDE 0.9% IV SOLUTION: Freq: Once | INTRAVENOUS | Status: DC

## 2022-04-17 MED ORDER — POTASSIUM CHLORIDE 20 MEQ PO PACK
40.0000 meq | PACK | Freq: Once | ORAL | Status: AC
  Administered 2022-04-17: 40 meq
  Filled 2022-04-17: qty 2

## 2022-04-17 MED ORDER — IPRATROPIUM-ALBUTEROL 0.5-2.5 (3) MG/3ML IN SOLN: 3.0000 mL | RESPIRATORY_TRACT | Status: DC

## 2022-04-17 MED ORDER — FUROSEMIDE 10 MG/ML IJ SOLN: 10.0000 mg | Freq: Once | INTRAMUSCULAR | Status: AC

## 2022-04-17 MED ORDER — FUROSEMIDE 10 MG/ML IJ SOLN
10.0000 mg | Freq: Once | INTRAMUSCULAR | Status: AC
  Administered 2022-04-17: 10 mg via INTRAVENOUS
  Filled 2022-04-17: qty 2

## 2022-04-17 MED ORDER — ALBUMIN HUMAN 25 % IV SOLN
INTRAVENOUS | Status: AC
  Administered 2022-04-17: 12.5 g via INTRAVENOUS
  Filled 2022-04-17: qty 50

## 2022-04-17 MED ORDER — ALBUMIN HUMAN 25 % IV SOLN
12.5000 g | Freq: Once | INTRAVENOUS | Status: AC
  Administered 2022-04-17: 12.5 g via INTRAVENOUS
  Filled 2022-04-17: qty 50

## 2022-04-17 NOTE — Progress Notes (Signed)
Patient resting, no changes to management today.  Erskine Emery MD

## 2022-04-18 ENCOUNTER — Encounter (HOSPITAL_COMMUNITY): Admission: EM | Disposition: E | Payer: Self-pay | Source: Home / Self Care | Attending: Critical Care Medicine

## 2022-04-18 ENCOUNTER — Inpatient Hospital Stay (HOSPITAL_COMMUNITY): Payer: Medicaid Other

## 2022-04-18 DIAGNOSIS — R4182 Altered mental status, unspecified: Secondary | ICD-10-CM | POA: Diagnosis not present

## 2022-04-18 HISTORY — PX: ORGAN PROCUREMENT: SHX5270

## 2022-04-18 LAB — COMPREHENSIVE METABOLIC PANEL
ALT: 134 U/L — ABNORMAL HIGH (ref 0–44)
ALT: 124 U/L — ABNORMAL HIGH (ref 0–44)
ALT: 113 U/L — ABNORMAL HIGH (ref 0–44)
AST: 21 U/L (ref 15–41)
AST: 21 U/L (ref 15–41)
AST: 21 U/L (ref 15–41)
Albumin: 2.5 g/dL — ABNORMAL LOW (ref 3.5–5.0)
Albumin: 2.5 g/dL — ABNORMAL LOW (ref 3.5–5.0)
Albumin: 2.4 g/dL — ABNORMAL LOW (ref 3.5–5.0)
Alkaline Phosphatase: 93 U/L (ref 38–126)
Alkaline Phosphatase: 98 U/L (ref 38–126)
Alkaline Phosphatase: 98 U/L (ref 38–126)
Anion gap: 9 (ref 5–15)
Anion gap: 10 (ref 5–15)
Anion gap: 13 (ref 5–15)
BUN: 13 mg/dL (ref 6–20)
BUN: 14 mg/dL (ref 6–20)
BUN: 14 mg/dL (ref 6–20)
CO2: 25 mmol/L (ref 22–32)
CO2: 25 mmol/L (ref 22–32)
CO2: 25 mmol/L (ref 22–32)
Calcium: 9.2 mg/dL (ref 8.9–10.3)
Calcium: 9 mg/dL (ref 8.9–10.3)
Calcium: 9 mg/dL (ref 8.9–10.3)
Chloride: 98 mmol/L (ref 98–111)
Chloride: 96 mmol/L — ABNORMAL LOW (ref 98–111)
Chloride: 95 mmol/L — ABNORMAL LOW (ref 98–111)
Creatinine, Ser: 0.69 mg/dL (ref 0.44–1.00)
Creatinine, Ser: 0.66 mg/dL (ref 0.44–1.00)
Creatinine, Ser: 0.65 mg/dL (ref 0.44–1.00)
GFR, Estimated: >60 mL/min (ref >60)
GFR, Estimated: >60 mL/min (ref >60)
GFR, Estimated: >60 mL/min (ref >60)
Glucose, Bld: 108 mg/dL — ABNORMAL HIGH (ref 70–99)
Glucose, Bld: 107 mg/dL — ABNORMAL HIGH (ref 70–99)
Glucose, Bld: 118 mg/dL — ABNORMAL HIGH (ref 70–99)
Potassium: 3.9 mmol/L (ref 3.5–5.1)
Potassium: 4.5 mmol/L (ref 3.5–5.1)
Potassium: 4.5 mmol/L (ref 3.5–5.1)
Sodium: 132 mmol/L — ABNORMAL LOW (ref 135–145)
Sodium: 131 mmol/L — ABNORMAL LOW (ref 135–145)
Sodium: 133 mmol/L — ABNORMAL LOW (ref 135–145)
Total Bilirubin: 0.8 mg/dL (ref 0.3–1.2)
Total Bilirubin: 0.6 mg/dL (ref 0.3–1.2)
Total Bilirubin: 1.5 mg/dL — ABNORMAL HIGH (ref 0.3–1.2)
Total Protein: 6.3 g/dL — ABNORMAL LOW (ref 6.5–8.1)
Total Protein: 6.1 g/dL — ABNORMAL LOW (ref 6.5–8.1)
Total Protein: 6.4 g/dL — ABNORMAL LOW (ref 6.5–8.1)

## 2022-04-18 LAB — PHOSPHORUS
Phosphorus: 7 mg/dL — ABNORMAL HIGH (ref 2.5–4.6)
Phosphorus: 7.8 mg/dL — ABNORMAL HIGH (ref 2.5–4.6)
Phosphorus: 7.8 mg/dL — ABNORMAL HIGH (ref 2.5–4.6)

## 2022-04-18 LAB — PROTIME-INR
INR: 1.2 (ref 0.8–1.2)
INR: 1.2 (ref 0.8–1.2)
INR: 1.1 (ref 0.8–1.2)
Prothrombin Time: 14.9 seconds (ref 11.4–15.2)
Prothrombin Time: 14.8 seconds (ref 11.4–15.2)
Prothrombin Time: 14.4 seconds (ref 11.4–15.2)

## 2022-04-18 LAB — URINALYSIS, ROUTINE W REFLEX MICROSCOPIC
Bilirubin Urine: NEGATIVE
Glucose, UA: NEGATIVE mg/dL
Hgb urine dipstick: NEGATIVE
Ketones, ur: NEGATIVE mg/dL
Nitrite: NEGATIVE
Protein, ur: NEGATIVE mg/dL
Specific Gravity, Urine: 1.015 (ref 1.005–1.030)
pH: 7 (ref 5.0–8.0)

## 2022-04-18 LAB — CBC
HCT: 24.4 % — ABNORMAL LOW (ref 36.0–46.0)
HCT: 24.3 % — ABNORMAL LOW (ref 36.0–46.0)
HCT: 26.9 % — ABNORMAL LOW (ref 36.0–46.0)
Hemoglobin: 8.1 g/dL — ABNORMAL LOW (ref 12.0–15.0)
Hemoglobin: 7.9 g/dL — ABNORMAL LOW (ref 12.0–15.0)
Hemoglobin: 8.9 g/dL — ABNORMAL LOW (ref 12.0–15.0)
MCH: 28 pg (ref 26.0–34.0)
MCH: 27.4 pg (ref 26.0–34.0)
MCH: 27.4 pg (ref 26.0–34.0)
MCHC: 33.2 g/dL (ref 30.0–36.0)
MCHC: 32.5 g/dL (ref 30.0–36.0)
MCHC: 33.1 g/dL (ref 30.0–36.0)
MCV: 84.4 fL (ref 80.0–100.0)
MCV: 84.4 fL (ref 80.0–100.0)
MCV: 82.8 fL (ref 80.0–100.0)
Platelets: 319 10*3/uL (ref 150–400)
Platelets: 320 10*3/uL (ref 150–400)
Platelets: 317 10*3/uL (ref 150–400)
RBC: 2.89 MIL/uL — ABNORMAL LOW (ref 3.87–5.11)
RBC: 2.88 MIL/uL — ABNORMAL LOW (ref 3.87–5.11)
RBC: 3.25 MIL/uL — ABNORMAL LOW (ref 3.87–5.11)
RDW: 14.6 % (ref 11.5–15.5)
RDW: 14.6 % (ref 11.5–15.5)
RDW: 15.9 % — ABNORMAL HIGH (ref 11.5–15.5)
WBC: 13.6 10*3/uL — ABNORMAL HIGH (ref 4.0–10.5)
WBC: 15.8 10*3/uL — ABNORMAL HIGH (ref 4.0–10.5)
WBC: 15.3 10*3/uL — ABNORMAL HIGH (ref 4.0–10.5)
nRBC: 0 % (ref 0.0–0.2)
nRBC: 0 % (ref 0.0–0.2)
nRBC: 0 % (ref 0.0–0.2)

## 2022-04-18 LAB — BLOOD CULTURE ID PANEL (REFLEXED) - BCID2

## 2022-04-18 LAB — AMYLASE
Amylase: 38 U/L (ref 28–100)
Amylase: 39 U/L (ref 28–100)
Amylase: 37 U/L (ref 28–100)

## 2022-04-18 LAB — APTT
aPTT: 29 seconds (ref 24–36)
aPTT: 30 seconds (ref 24–36)
aPTT: 29 seconds (ref 24–36)

## 2022-04-18 LAB — LIPASE, BLOOD
Lipase: 58 U/L — ABNORMAL HIGH (ref 11–51)
Lipase: 59 U/L — ABNORMAL HIGH (ref 11–51)
Lipase: 57 U/L — ABNORMAL HIGH (ref 11–51)

## 2022-04-18 LAB — LACTIC ACID, PLASMA
Lactic Acid, Venous: 0.7 mmol/L (ref 0.5–1.9)
Lactic Acid, Venous: 0.6 mmol/L (ref 0.5–1.9)
Lactic Acid, Venous: 1.3 mmol/L (ref 0.5–1.9)

## 2022-04-18 LAB — CK
Total CK: 52 U/L (ref 38–234)
Total CK: 54 U/L (ref 38–234)
Total CK: 93 U/L (ref 38–234)

## 2022-04-18 LAB — TROPONIN I (HIGH SENSITIVITY)
Troponin I (High Sensitivity): 3 ng/L (ref <18)
Troponin I (High Sensitivity): <2 ng/L (ref <18)
Troponin I (High Sensitivity): 3 ng/L (ref <18)

## 2022-04-18 LAB — BILIRUBIN, DIRECT
Bilirubin, Direct: <0.1 mg/dL (ref 0.0–0.2)
Bilirubin, Direct: <0.1 mg/dL (ref 0.0–0.2)
Bilirubin, Direct: 0.2 mg/dL (ref 0.0–0.2)

## 2022-04-18 LAB — URINE CULTURE: Culture: NO GROWTH

## 2022-04-18 LAB — MAGNESIUM
Magnesium: 1.9 mg/dL (ref 1.7–2.4)
Magnesium: 1.9 mg/dL (ref 1.7–2.4)
Magnesium: 2 mg/dL (ref 1.7–2.4)

## 2022-04-18 LAB — PREPARE RBC (CROSSMATCH)

## 2022-04-18 SURGERY — SURGICAL PROCUREMENT, ORGAN
Anesthesia: General | Site: Chest

## 2022-04-18 MED ORDER — GLYCOPYRROLATE 1 MG PO TABS: 1.0000 mg | ORAL_TABLET | ORAL | Status: DC | PRN

## 2022-04-18 MED ORDER — ACETAMINOPHEN 650 MG RE SUPP: 650.0000 mg | Freq: Four times a day (QID) | RECTAL | Status: DC | PRN

## 2022-04-18 MED ORDER — GLYCOPYRROLATE 0.2 MG/ML IJ SOLN: 0.2000 mg | INTRAMUSCULAR | Status: DC | PRN

## 2022-04-18 MED ORDER — MIDAZOLAM BOLUS VIA INFUSION (WITHDRAWAL LIFE SUSTAINING TX): 2.0000 mg | INTRAVENOUS | Status: DC | PRN

## 2022-04-18 MED ORDER — MORPHINE BOLUS VIA INFUSION: 5.0000 mg | INTRAVENOUS | Status: DC | PRN

## 2022-04-18 MED ORDER — MIDAZOLAM HCL 2 MG/2ML IJ SOLN: 1.0000 mg | INTRAMUSCULAR | Status: DC | PRN

## 2022-04-18 MED ORDER — HEPARIN SODIUM (PORCINE) 1000 UNIT/ML IJ SOLN: 30000.0000 [IU] | Freq: Once | INTRAMUSCULAR | Status: DC

## 2022-04-18 MED ORDER — MORPHINE 100MG IN NS 100ML (1MG/ML) PREMIX INFUSION
0.0000 mg/h | INTRAVENOUS | Status: DC
  Administered 2022-04-18: 10 mg/h via INTRAVENOUS
  Filled 2022-04-18: qty 100

## 2022-04-18 MED ORDER — SODIUM CHLORIDE 0.9 % IV SOLN: INTRAVENOUS | Status: DC

## 2022-04-18 MED ORDER — MIDAZOLAM-SODIUM CHLORIDE 100-0.9 MG/100ML-% IV SOLN
0.0000 mg/h | INTRAVENOUS | Status: DC
  Administered 2022-04-18: 10 mg/h via INTRAVENOUS
  Filled 2022-04-18: qty 100

## 2022-04-18 MED ORDER — ACETAMINOPHEN 325 MG PO TABS: 650.0000 mg | ORAL_TABLET | Freq: Four times a day (QID) | ORAL | Status: DC | PRN

## 2022-04-18 MED ORDER — 0.9 % SODIUM CHLORIDE (POUR BTL) OPTIME
TOPICAL | Status: DC | PRN
  Administered 2022-04-18: 8000 mL

## 2022-04-18 MED ORDER — HEPARIN SODIUM 10000 UNIT/ML FOR ORGAN DONATION
30000.0000 [IU] | INTRAMUSCULAR | Status: AC
  Administered 2022-04-18: 30000 [IU] via INTRAVENOUS
  Filled 2022-04-18: qty 3

## 2022-04-18 MED ORDER — SODIUM CHLORIDE 0.9% IV SOLUTION: Freq: Once | INTRAVENOUS | Status: DC

## 2022-04-18 MED ORDER — POLYVINYL ALCOHOL 1.4 % OP SOLN: 1.0000 [drp] | Freq: Four times a day (QID) | OPHTHALMIC | Status: DC | PRN

## 2022-04-18 MED ORDER — PROPOFOL 1000 MG/100ML IV EMUL
5.0000 ug/kg/min | INTRAVENOUS | Status: DC
  Administered 2022-04-18: 50 ug/kg/min via INTRAVENOUS
  Filled 2022-04-18: qty 100

## 2022-04-18 SURGICAL SUPPLY — 96 items
ADAPTER CATH SYR TO TUBING 38M (ADAPTER) IMPLANT
ADPR CATH LL SYR 3/32 TPR (ADAPTER) ×1
APPLIER CLIP 11 MED OPEN (CLIP) ×1
APR CLP MED 11 20 MLT OPN (CLIP) ×1
BAG COUNTER SPONGE SURGICOUNT (BAG) ×2 IMPLANT
BAG SPNG CNTER NS LX DISP (BAG) ×1
BLADE CLIPPER SURG (BLADE) IMPLANT
BLADE SAW STERNAL (BLADE) ×2 IMPLANT
BLADE SURG 10 STRL SS (BLADE) IMPLANT
CLIP APPLIE 11 MED OPEN (CLIP) ×2 IMPLANT
CLIP TI MEDIUM 24 (CLIP) IMPLANT
CLIP TI WIDE RED SMALL 24 (CLIP) IMPLANT
CNTNR URN SCR LID CUP LEK RST (MISCELLANEOUS) ×2 IMPLANT
CONT SPEC 4OZ STRL OR WHT (MISCELLANEOUS) ×2
COVER BACK TABLE 60X90IN (DRAPES) IMPLANT
COVER MAYO STAND STRL (DRAPES) IMPLANT
COVER SURGICAL LIGHT HANDLE (MISCELLANEOUS) ×2 IMPLANT
DRAPE HALF SHEET 40X57 (DRAPES) IMPLANT
DRAPE SLUSH MACHINE 52X66 (DRAPES) ×2 IMPLANT
DRSG COVADERM 4X10 (GAUZE/BANDAGES/DRESSINGS) IMPLANT
DRSG COVADERM 4X14 (GAUZE/BANDAGES/DRESSINGS) IMPLANT
DRSG TELFA 3X8 NADH STRL (GAUZE/BANDAGES/DRESSINGS) ×2 IMPLANT
DURAPREP 26ML APPLICATOR (WOUND CARE) IMPLANT
ELECT BLADE 6.5 EXT (BLADE) IMPLANT
ELECT REM PT RETURN 9FT ADLT (ELECTROSURGICAL) ×1
ELECTRODE REM PT RTRN 9FT ADLT (ELECTROSURGICAL) ×4 IMPLANT
GAUZE 4X4 16PLY ~~LOC~~+RFID DBL (SPONGE) IMPLANT
GLOVE BIO SURGEON STRL SZ7 (GLOVE) IMPLANT
GLOVE BIO SURGEON STRL SZ7.5 (GLOVE) IMPLANT
GLOVE BIO SURGEON STRL SZ8 (GLOVE) IMPLANT
GLOVE BIO SURGEON STRL SZ8.5 (GLOVE) IMPLANT
GLOVE BIOGEL PI IND STRL 7.0 (GLOVE) IMPLANT
GLOVE BIOGEL PI IND STRL 7.5 (GLOVE) IMPLANT
GLOVE BIOGEL PI IND STRL 8 (GLOVE) IMPLANT
GLOVE BIOGEL PI IND STRL 8.5 (GLOVE) IMPLANT
GLOVE SURG SS PI 7.0 STRL IVOR (GLOVE) IMPLANT
GLOVE SURG SS PI 7.5 STRL IVOR (GLOVE) IMPLANT
GLOVE SURG SS PI 8.0 STRL IVOR (GLOVE) IMPLANT
GOWN STRL REUS W/ TWL LRG LVL3 (GOWN DISPOSABLE) ×8 IMPLANT
GOWN STRL REUS W/ TWL XL LVL3 (GOWN DISPOSABLE) ×4 IMPLANT
GOWN STRL REUS W/TWL LRG LVL3 (GOWN DISPOSABLE) ×4
GOWN STRL REUS W/TWL XL LVL3 (GOWN DISPOSABLE) ×4
HANDLE SUCTION POOLE (INSTRUMENTS) IMPLANT
KIT POST MORTEM ADULT 36X90 (BAG) ×2 IMPLANT
KIT TURNOVER KIT B (KITS) ×2 IMPLANT
LOOP VASCLR MAXI BLUE 18IN ST (MISCELLANEOUS) IMPLANT
LOOP VASCULAR MAXI 18 BLUE (MISCELLANEOUS)
LOOP VASCULAR MINI 18 RED (MISCELLANEOUS)
LOOPS VASCLR MAXI BLUE 18IN ST (MISCELLANEOUS) IMPLANT
MANIFOLD NEPTUNE II (INSTRUMENTS) ×2 IMPLANT
NDL BIOPSY 14X6 SOFT TISS (NEEDLE) IMPLANT
NEEDLE BIOPSY 14X6 SOFT TISS (NEEDLE) IMPLANT
NS IRRIG 1000ML POUR BTL (IV SOLUTION) IMPLANT
PACK AORTA (CUSTOM PROCEDURE TRAY) ×2 IMPLANT
PAD ARMBOARD 7.5X6 YLW CONV (MISCELLANEOUS) ×4 IMPLANT
PENCIL BUTTON HOLSTER BLD 10FT (ELECTRODE) ×2 IMPLANT
SOL PREP POV-IOD 4OZ 10% (MISCELLANEOUS) ×4 IMPLANT
SPONGE INTESTINAL PEANUT (DISPOSABLE) IMPLANT
SPONGE T-LAP 18X18 ~~LOC~~+RFID (SPONGE) IMPLANT
STAPLER VISISTAT 35W (STAPLE) ×2 IMPLANT
SUCTION POOLE HANDLE (INSTRUMENTS) ×2
SUT BONE WAX W31G (SUTURE) IMPLANT
SUT ETHIBOND 5 LR DA (SUTURE) IMPLANT
SUT ETHILON 1 LR 30 (SUTURE) ×4 IMPLANT
SUT ETHILON 2 LR (SUTURE) IMPLANT
SUT PROLENE 3 0 RB 1 (SUTURE) IMPLANT
SUT PROLENE 3 0 SH 1 (SUTURE) IMPLANT
SUT PROLENE 4 0 RB 1 (SUTURE)
SUT PROLENE 4 0 SH DA (SUTURE) IMPLANT
SUT PROLENE 4-0 RB1 .5 CRCL 36 (SUTURE) IMPLANT
SUT PROLENE 5 0 C 1 24 (SUTURE) IMPLANT
SUT PROLENE 6 0 BV (SUTURE) IMPLANT
SUT SILK 0 TIES 10X30 (SUTURE) IMPLANT
SUT SILK 1 SH (SUTURE) IMPLANT
SUT SILK 1 TIES 10X30 (SUTURE) IMPLANT
SUT SILK 2 0 (SUTURE) ×2
SUT SILK 2 0 SH (SUTURE) IMPLANT
SUT SILK 2 0 SH CR/8 (SUTURE) IMPLANT
SUT SILK 2 0 TIES 10X30 (SUTURE) IMPLANT
SUT SILK 2-0 18XBRD TIE 12 (SUTURE) IMPLANT
SUT SILK 3 0 (SUTURE) ×2
SUT SILK 3 0 SH CR/8 (SUTURE) IMPLANT
SUT SILK 3 0 TIES 10X30 (SUTURE) IMPLANT
SUT SILK 3-0 18XBRD TIE 12 (SUTURE) IMPLANT
SWAB COLLECTION DEVICE MRSA (MISCELLANEOUS) IMPLANT
SWAB CULTURE ESWAB REG 1ML (MISCELLANEOUS) IMPLANT
SYR 50ML LL SCALE MARK (SYRINGE) IMPLANT
SYRINGE TOOMEY DISP (SYRINGE) IMPLANT
TAPE UMBILICAL 1/8 X36 TWILL (MISCELLANEOUS) IMPLANT
TOWEL OR NON WOVEN STRL DISP B (DISPOSABLE) IMPLANT
TUBE CONNECTING 12X1/4 (SUCTIONS) ×2 IMPLANT
TUBE CONNECTING 20X1/4 (TUBING) IMPLANT
VASCULAR TIE MAXI BLUE 18IN ST (MISCELLANEOUS)
VASCULAR TIE MINI RED 18IN STL (MISCELLANEOUS) IMPLANT
WATER STERILE IRR 1000ML POUR (IV SOLUTION) IMPLANT
YANKAUER SUCT BULB TIP NO VENT (SUCTIONS) ×2 IMPLANT

## 2022-04-19 ENCOUNTER — Encounter (HOSPITAL_COMMUNITY): Payer: Self-pay

## 2022-04-19 ENCOUNTER — Other Ambulatory Visit: Payer: Self-pay

## 2022-04-19 LAB — TYPE AND SCREEN
ABO/RH(D): B POS
Antibody Screen: NEGATIVE
Unit division: 0
Unit division: 0
Unit division: 0
Unit division: 0
Unit division: 0
Unit division: 0

## 2022-04-19 LAB — BPAM RBC
Blood Product Expiration Date: 202403222359
Blood Product Expiration Date: 202403222359
Blood Product Expiration Date: 202404032359
Blood Product Expiration Date: 202404042359
Blood Product Expiration Date: 202404042359
Blood Product Expiration Date: 202404042359
ISSUE DATE / TIME: 202403170813
ISSUE DATE / TIME: 202403171546
ISSUE DATE / TIME: 202403171546
ISSUE DATE / TIME: 202403171546
ISSUE DATE / TIME: 202403171546
ISSUE DATE / TIME: 202403171546
Unit Type and Rh: 7300
Unit Type and Rh: 7300
Unit Type and Rh: 7300
Unit Type and Rh: 7300
Unit Type and Rh: 7300
Unit Type and Rh: 7300

## 2022-04-19 LAB — CULTURE, BAL-QUANTITATIVE W GRAM STAIN
Culture: NO GROWTH
Culture: NO GROWTH

## 2022-04-19 LAB — CULTURE, BLOOD (ROUTINE X 2): Special Requests: ADEQUATE

## 2022-04-21 LAB — CULTURE, BLOOD (ROUTINE X 2)
Culture: NO GROWTH
Special Requests: ADEQUATE

## 2022-05-03 NOTE — OR Nursing (Signed)
Both kidneys out at 44

## 2022-05-03 NOTE — OR Nursing (Signed)
Liver out at [employer] [facility]

## 2022-05-03 NOTE — Progress Notes (Signed)
No spontaneous respiratory effort, no palpable pulses, no breath or heart sounds auscultated by Berta Minor, RN and confirmed Nori Riis, RN at [phone].Belongings sent with family.  [facility] aware.

## 2022-05-03 NOTE — Progress Notes (Signed)
PHARMACY - PHYSICIAN COMMUNICATION CRITICAL VALUE ALERT - BLOOD CULTURE IDENTIFICATION (BCID)  Rachael [name] is an 22 y.o. female who presented to [facility] on 03/12/2022 with a chief complaint of shortness of breath and upper and lower extremity weakness.   Assessment:  1/4 staph epi with resistance - likely contaminant but patient does have elevated WBC and has been tachycardic  Name of physician (or Provider) Contacted: Mohan  Current antibiotics: None -pt being followed by palliative care and scheduled for organ procurement later today; overnight provider has deferred treatment for the day shift team    Results for orders placed or performed during the hospital encounter of 03/29/2022  Blood Culture ID Panel (Reflexed) (Collected: 04/16/2022  5:04 PM)  Result Value Ref Range   Enterococcus faecalis NOT DETECTED NOT DETECTED   Enterococcus Faecium NOT DETECTED NOT DETECTED   Listeria monocytogenes NOT DETECTED NOT DETECTED   Staphylococcus species DETECTED (A) NOT DETECTED   Staphylococcus aureus (BCID) NOT DETECTED NOT DETECTED   Staphylococcus epidermidis DETECTED (A) NOT DETECTED   Staphylococcus lugdunensis NOT DETECTED NOT DETECTED   Streptococcus species NOT DETECTED NOT DETECTED   Streptococcus agalactiae NOT DETECTED NOT DETECTED   Streptococcus pneumoniae NOT DETECTED NOT DETECTED   Streptococcus pyogenes NOT DETECTED NOT DETECTED   A.calcoaceticus-baumannii NOT DETECTED NOT DETECTED   Bacteroides fragilis NOT DETECTED NOT DETECTED   Enterobacterales NOT DETECTED NOT DETECTED   Enterobacter cloacae complex NOT DETECTED NOT DETECTED   Escherichia coli NOT DETECTED NOT DETECTED   Klebsiella aerogenes NOT DETECTED NOT DETECTED   Klebsiella oxytoca NOT DETECTED NOT DETECTED   Klebsiella pneumoniae NOT DETECTED NOT DETECTED   Proteus species NOT DETECTED NOT DETECTED   Salmonella species NOT DETECTED NOT DETECTED   Serratia marcescens NOT DETECTED NOT DETECTED    Haemophilus influenzae NOT DETECTED NOT DETECTED   Neisseria meningitidis NOT DETECTED NOT DETECTED   Pseudomonas aeruginosa NOT DETECTED NOT DETECTED   Stenotrophomonas maltophilia NOT DETECTED NOT DETECTED   Candida albicans NOT DETECTED NOT DETECTED   Candida auris NOT DETECTED NOT DETECTED   Candida glabrata NOT DETECTED NOT DETECTED   Candida krusei NOT DETECTED NOT DETECTED   Candida parapsilosis NOT DETECTED NOT DETECTED   Candida tropicalis NOT DETECTED NOT DETECTED   Cryptococcus neoformans/gattii NOT DETECTED NOT DETECTED   Methicillin resistance mecA/C DETECTED (A) NOT DETECTED    Jodean Lima Viha Kriegel Apr 23, 2022  5:29 AM

## 2022-05-03 NOTE — OR Nursing (Signed)
Heart out at [place].

## 2022-05-03 NOTE — Progress Notes (Addendum)
eLink Physician-Brief Progress Note Patient Name: Rachael [name] DOB: 11-25-2000 MRN: [id number]   Date of Service  Apr 25, 2022  HPI/Events of Note  RpH: notofication microlab called to report 1 of 4 positive culture for staph epi... likely contaminant but WBC elevated and pt has been tachycardic within the past 8 hours. Of note, patient is scheduled for organ procurement later today. Not sure if possible infection would have any effect on being able to harvest organs  3/13 pt and mother informed RN they would like to transition to comfort measures on Sunday 04-25-2022   eICU Interventions  Will leave it to AM team.     Intervention Category Intermediate Interventions: Other:  Elmer Sow 25-Apr-2022, 5:27 AM  0620 AM [facility] is requesting for an order for blood culture x1. Since they agree that one + blood culture is likely a contaimant. They just need an order to draw another one. Ordered once

## 2022-05-03 NOTE — Progress Notes (Signed)
2019 Rachael [name] ME notified of pt TOD.

## 2022-05-03 NOTE — Progress Notes (Signed)
Daily Progress Note   Patient Name: Rachael [name]       Date: 04/10/2022 DOB: 09-02-2000  Age: 22 y.o. MRN#: [id number] Attending Physician: Lorin Glass, MD Primary Care Physician: Patient, No Pcp Per Admit Date: April 05, 2022 Length of Stay: 30 days  Reason for Consultation/Follow-up: Establishing goals of care  HPI/Patient Profile:  depression and suicidal ideation who presented to [facility] with sudden onset of shortness of breath and upper and lower extremity weakness. ED work-up found loss of speaking ability and hypercarbic respiratory failure and was subsequently intubated and transferred to [facility].  She was admitted on 04/05/2022 with acute flaccid paralysis due to anterior spinal cord infarction, acute hypercapnic and hypoxic respiratory failure due to neuromuscular weakness, aspiration pneumonia, hypothermia, shock likely neurogenic and others.   Palliative was consulted for GOC conversations.   Subjective:   Subjective: Chart Reviewed. Updates received. Patient Assessed. Created space and opportunity for patient  and family to explore thoughts and feelings regarding current medical situation.  Today's Discussion: I saw the patient extensively throughout the day on multiple specific occasions due to the current situation and plans for on her walk and organ donation today.  I never saw the patient she was at the bedside with her mother.  We confirmed desire for compassionate extubation after adequate sedation as well as desire to donate organs.  The patient and mother are both with this.  The patient has multiple family and friends coming to visit her today.  I let him know that I would be available throughout the day as needed and would come back frequently to check on them.  I was present intermittently throughout the day for ongoing family support.  Participated in on a walk to honor the patient and her gifted donation.  This occurred after she was sedated with propofol  drip, Versed drip, morphine drip per pulmonary critical care.  I provided emotional and general support through therapeutic listening, empathy, sharing of stories, therapeutic touch, and other techniques. I answered all questions and addressed all concerns to the best of my ability.  Review of Systems  Constitutional:        Denies pain in general  Respiratory:  Negative for shortness of breath.   Gastrointestinal:  Negative for abdominal pain, nausea and vomiting.    Objective:   Vital Signs:  BP (!) 102/97   Pulse 75   Temp 99.5 F (37.5 C) (Axillary)   Resp (!) 22   Ht 5\' 7"  (1.702 m)   Wt 90 kg   SpO2 100%   BMI 31.08 kg/m   Physical Exam: Physical Exam Constitutional:      General: She is not in acute distress.    Appearance: She is ill-appearing.     Interventions: She is intubated.  HENT:     Head: Normocephalic and atraumatic.  Cardiovascular:     Rate and Rhythm: Normal rate.  Pulmonary:     Effort: She is intubated.     Comments: Intubated via trach; functional quadraplegia with no respiratory ability Neurological:     Mental Status: She is alert.     Palliative Assessment/Data: 20-30%    Existing Vynca/ACP Documentation: None  Assessment & Plan:   Impression: Present on Admission: . Altered mental state  SUMMARY OF RECOMMENDATIONS   DNR Planned compassionate extubation today Planned organ donation today PMT will continue to follow for family support while still admitted  Symptom Management:  Per primary team PMT is available to assist as needed  Code Status: DNR  Prognosis: Hours - Days  Discharge Planning: Anticipated Hospital Death  Discussed with: Patient, family, medical team, nursing team, [facility] team  Thank you for allowing [place] to participate in the care of Rachael [name] PMT will continue to support holistically.  Time Total: 240 min  Visit consisted of counseling and education dealing with the complex and  emotionally intense issues of symptom management and palliative care in the setting of serious and potentially life-threatening illness. Greater than 50%  of this time was spent counseling and coordinating care related to the above assessment and plan.  Rachael Dust, NP Palliative Medicine Team  Team Phone # [phone] (Nights/Weekends)  09/30/2020, 8:17 AM

## 2022-05-03 NOTE — Progress Notes (Addendum)
04-26-2022  I have seen and evaluated the patient for vent dependence.  S:  No events, denies pain, in good spirits, family at bedside.  O: Blood pressure (!) 98/46, pulse 90, temperature 99.2 F (37.3 C), temperature source Axillary, resp. rate (!) 22, height 5\' 7"  (1.702 m), weight 90 kg, SpO2 100 %.  No distress Passive on vent Nodding head appropriately Ext warm  A:  Spontaneous anterior spinal cord infarct resulting in quadriplegia  End of life care  P:  - Continue vent support - After friend visits we will provide adequate sedation to allow a peaceful natural death  Please note she has had no suicidal ideations or depression in years.  This is her decision to pass on and family is supportive.  Given her prognosis, this is a reasonable request and we will honor it after multidisciplinary discussion.  Erskine Emery MD [place] Prefer epic messenger for cross cover needs If after hours, please call E-link

## 2022-05-03 NOTE — Progress Notes (Signed)
   05-03-22 1005  Spiritual Encounters  Type of Visit Follow up;Declined chaplain visit  Care provided to: Pt and family  OnCall Visit Yes   Chaplain Myonna Chisom followed up with family per Chaplain Dodge's request. Family was welcoming but declined chaplain visit at this time.    Note prepared by Abbott Pao, [facility] Resident [phone]

## 2022-05-03 NOTE — Progress Notes (Signed)
Per CCM, and [employer], w/ understanding of pt and family wishes, RT transported pt on the vent to the OR. Pt was removed from the ventilator at 1812.

## 2022-05-03 NOTE — Death Summary Note (Signed)
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 Secondary Diagnoses (including complications and co-morbidities):  Principal Problem:   Altered mental state Active Problems:   Paralysis ([place])   Chronic respiratory failure with hypoxia and hypercapnia ([place])   Cerebrovascular accident (CVA) ([place])   Hypothermia   Community acquired pneumonia   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 F presented to [facility] ED with sudden onset of shortness of breath and upper and lower extremity weakness.  History taken from Epic as patient is intubated and no family is at the bedside.  She initially had a headache and back pain prior to onset but denied abdominal pain or chest pain.  Vitals showed an initial temp of 26F, bloodwork with WBC 18k, UDS + for bezos and THC.   Her symptoms progressed to the point she wasn't speaking and ABG showed hypercarbic respiratory failure so pt was intubated.  She had some bradycardia and hypotension post-intubation, so was started on peripheral Levophed and given atropine during transport.  Workup revealed an extensive anterior cervical cord infarct from C1 through C7 consistent with anterior spinal cord infarct.  Extensive coagulopathy and embolic workup was negative.  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  She initially was hesitant to tracheostomy  but mother convinced her to undergo.   Despite multiple weeks, patient quadriplegia did not improve.  We did discuss her case with tertiary centers to consider diaphragmatic pacing but she was not accepted as a candidate.  After multiple multidisciplinary discussions with her and extended family she let it be known she would rather go on her own terms than be forced to live on a ventilator.  She also wanted to be an organ donor.  At an agreed upon time we honored her wishes and she was removed from the ventilator, passed peacefully in the OR with her family at bedside.  Pertinent Labs and Studies  Significant Diagnostic Studies DG Abd 1 View  Result Date: 2022-04-20 CLINICAL DATA:  Incorrect surgical count EXAM: ABDOMEN - 1 VIEW COMPARISON:  None Available. FINDINGS: Gastrostomy catheter overlies the expected gastric lumen within the left upper quadrant. Extraluminal extraperitoneal gas noted within the left mid abdomen and within the pelvis bilaterally. Nonobstructive bowel gas pattern. Bladder temperature probe in expected position. l a 5.8 cm rounded radiopaque foreign body is seen within the mid pelvis overlying the expected position of the bladder. No acute bone abnormality. IMPRESSION: 5.8 cm rounded radiopaque foreign body within the bladder. Correlation with surgical history is recommended. These results were called by telephone at the time of interpretation on 04/20/22 at 9:36 pm to the circulating nurse in OR 12, who verbally acknowledged these results. Electronically Signed   By: Fidela Salisbury M.D.   On: Apr 20, 2022 21:37   DG CHEST PORT 1 VIEW  Result Date: 04/17/2022 CLINICAL DATA:  Shortness of breath EXAM: PORTABLE CHEST 1 VIEW COMPARISON:  04/17/2022  FINDINGS: Tracheostomy tube remains in place with tip about 3.7 cm superior to carina. Improved aeration left base with residual mild airspace disease. The right lung is clear. IMPRESSION: Improved aeration at the left base with residual  mild airspace disease. Electronically Signed   By: Donavan Foil M.D.   On: 04/17/2022 21:14   DG CHEST PORT 1 VIEW  Result Date: 04/17/2022 CLINICAL DATA:  Provided history: Shortness of breath. EXAM: PORTABLE CHEST 1 VIEW COMPARISON:  Prior chest radiographs 04/16/2022 and earlier. Chest CT 04/16/2022. FINDINGS: A small portion of the left lateral costophrenic angle may be excluded from the field of view. The cardiomediastinal silhouette is unchanged. Opacities within the left greater than right lung bases, progressed from the prior chest radiograph of 04/16/2022 and compatible with atelectasis and/or pneumonia. Suspected persistent small left pleural effusion. No evidence of pneumothorax. No acute osseous abnormality identified. IMPRESSION: A small portion of the left lateral costophrenic angle may be excluded from the field of view. Opacities within the left greater than right lung bases, progressed from the prior chest radiograph of 04/16/2022 and compatible with atelectasis and/or pneumonia. Persistent small left pleural effusion suspected. Electronically Signed   By: Kellie Simmering D.O.   On: 04/17/2022 10:24   CT CHEST ABDOMEN PELVIS WO CONTRAST  Result Date: 04/16/2022 CLINICAL DATA:  Sepsis.  Altered mental status.  Weakness. EXAM: CT CHEST, ABDOMEN AND PELVIS WITHOUT CONTRAST TECHNIQUE: Multidetector CT imaging of the chest, abdomen and pelvis was performed following the standard protocol without IV contrast. RADIATION DOSE REDUCTION: This exam was performed according to the departmental dose-optimization program which includes automated exposure control, adjustment of the mA and/or kV according to patient size and/or use of iterative reconstruction technique. COMPARISON:  03/23/2022 FINDINGS: CT CHEST FINDINGS Cardiovascular: The heart size is normal. No substantial pericardial effusion. Trace pericardial effusion. Left PICC line tip is positioned in the right atrium. There is a second left-sided  upper extremity line with the tip positioned in the subclavian region. Mediastinum/Nodes: Tracheostomy tube evident. No mediastinal lymphadenopathy. No evidence for gross hilar lymphadenopathy although assessment is limited by the lack of intravenous contrast on the current study. The esophagus has normal imaging features. There is no axillary lymphadenopathy. Lungs/Pleura: Collapse/consolidation is seen in both lower lobes, left more than right. Tiny bilateral pleural effusions evident. Musculoskeletal: No worrisome lytic or sclerotic osseous abnormality. CT ABDOMEN PELVIS FINDINGS Hepatobiliary: No suspicious focal abnormality in the liver on this study without intravenous contrast. There is no evidence for gallstones, gallbladder wall thickening, or pericholecystic fluid. No intrahepatic or extrahepatic biliary dilation. Pancreas: No focal mass lesion. No dilatation of the main duct. No intraparenchymal cyst. No peripancreatic edema. Spleen: No splenomegaly. No focal mass lesion. Adrenals/Urinary Tract: No adrenal nodule or mass. Kidneys unremarkable. No evidence for hydroureter. Bladder decompressed by a Foley catheter. Stomach/Bowel: Stomach is moderately distended. Gastrostomy tube balloon is identified in the gastric lumen. Duodenum is normally positioned as is the ligament of Treitz. No small bowel wall thickening. No small bowel dilatation. Neither the terminal ileum nor the appendix are discretely visible. Colon is diffusely distended and filled with gas and fluid. Rectal tube evident. Vascular/Lymphatic: No abdominal aortic aneurysm. No abdominal aortic atherosclerotic calcification. There is no gastrohepatic or hepatoduodenal ligament lymphadenopathy. No retroperitoneal or mesenteric lymphadenopathy. No pelvic sidewall lymphadenopathy. Reproductive: Unremarkable. Other: Small volume free fluid. Musculoskeletal: No worrisome lytic or sclerotic osseous abnormality. IMPRESSION: 1. Collapse/consolidation in  both lower lobes, left more than right with tiny bilateral pleural effusions. Aeration  in the left lung is improved compared to 03/30/2022 with interval worsening of right lower lobe airspace disease since that time. Right middle lobe is collapse on the previous study but is expanded on the current exam. 2. Colon is diffusely distended and filled with gas and fluid. Imaging features could be compatible with clinical diarrhea. 3. Small volume free fluid in the abdomen. Electronically Signed   By: Misty Stanley M.D.   On: 04/16/2022 16:58   ECHOCARDIOGRAM COMPLETE  Result Date: 04/16/2022    ECHOCARDIOGRAM REPORT   Patient Name:   SHEKINA FADLER Date of Exam: 04/16/2022 Medical Rec #:  [id number]        Height:       67.0 in Accession #:    [id number]       Weight:       198.4 lb Date of Birth:  2000/10/06         BSA:          2.015 m Patient Age:    22 years         BP:           120/73 mmHg Patient Gender: F                HR:           80 bpm. Exam Location:  Inpatient Procedure: 2D Echo, Cardiac Doppler and Color Doppler Indications:    Cardiac arrest  History:        Patient has prior history of Echocardiogram examinations, most                 recent 03/23/2022. Stroke.  Sonographer:    Wilkie Aye RVT RCS Referring Phys: [id number] Candee Furbish  Sonographer Comments: Suboptimal parasternal window, suboptimal apical window and echo performed with patient supine and on artificial respirator. Supine and unable to reposition. IMPRESSIONS  1. Left ventricular ejection fraction, by estimation, is 60 to 65%. The left ventricle has normal function. The left ventricle has no regional wall motion abnormalities. Left ventricular diastolic parameters were normal.  2. Right ventricular systolic function is normal. The right ventricular size is normal. There is normal pulmonary artery systolic pressure. The estimated right ventricular systolic pressure is 123456 mmHg.  3. A small pericardial effusion is present. The  pericardial effusion is circumferential.  4. The mitral valve is normal in structure. No evidence of mitral valve regurgitation. No evidence of mitral stenosis.  5. The aortic valve is tricuspid. Aortic valve regurgitation is not visualized. No aortic stenosis is present. Comparison(s): Prior images reviewed side by side. EF slightly improved compared to prior. Conclusion(s)/Recommendation(s): Normal biventricular function without evidence of hemodynamically significant valvular heart disease. FINDINGS  Left Ventricle: Left ventricular ejection fraction, by estimation, is 60 to 65%. The left ventricle has normal function. The left ventricle has no regional wall motion abnormalities. The left ventricular internal cavity size was normal in size. There is  no left ventricular hypertrophy. Left ventricular diastolic parameters were normal. Right Ventricle: The right ventricular size is normal. No increase in right ventricular wall thickness. Right ventricular systolic function is normal. There is normal pulmonary artery systolic pressure. The tricuspid regurgitant velocity is 2.14 m/s, and  with an assumed right atrial pressure of 8 mmHg, the estimated right ventricular systolic pressure is 123456 mmHg. Left Atrium: Left atrial size was normal in size. Right Atrium: Right atrial size was normal in size. Pericardium: A small pericardial effusion is present. The pericardial effusion is circumferential.  Mitral Valve: The mitral valve is normal in structure. No evidence of mitral valve regurgitation. No evidence of mitral valve stenosis. Tricuspid Valve: The tricuspid valve is normal in structure. Tricuspid valve regurgitation is trivial. No evidence of tricuspid stenosis. Aortic Valve: The aortic valve is tricuspid. Aortic valve regurgitation is not visualized. No aortic stenosis is present. Aortic valve mean gradient measures 3.0 mmHg. Aortic valve peak gradient measures 5.7 mmHg. Aortic valve area, by VTI measures 2.24  cm. Pulmonic Valve: The pulmonic valve was not well visualized. Pulmonic valve regurgitation is not visualized. No evidence of pulmonic stenosis. Aorta: The aortic root, ascending aorta, aortic arch and descending aorta are all structurally normal, with no evidence of dilitation or obstruction. Venous: IVC assessment for right atrial pressure unable to be performed due to mechanical ventilation. IAS/Shunts: No atrial level shunt detected by color flow Doppler. Additional Comments: There is a small pleural effusion in the left lateral region.  LEFT VENTRICLE PLAX 2D LVIDd:         4.90 cm   Diastology LVIDs:         2.90 cm   LV e' medial:    9.79 cm/s LV PW:         1.10 cm   LV E/e' medial:  8.1 LV IVS:        1.00 cm   LV e' lateral:   18.90 cm/s LVOT diam:     2.10 cm   LV E/e' lateral: 4.2 LV SV:         50 LV SV Index:   25 LVOT Area:     3.46 cm  RIGHT VENTRICLE             IVC RV Basal diam:  3.50 cm     IVC diam: 2.10 cm RV S prime:     10.60 cm/s TAPSE (M-mode): 2.0 cm LEFT ATRIUM           Index        RIGHT ATRIUM           Index LA Vol (A4C): 26.3 ml 13.05 ml/m  RA Area:     13.00 cm                                    RA Volume:   37.10 ml  18.41 ml/m  AORTIC VALVE                    PULMONIC VALVE AV Area (Vmax):    2.41 cm     PV Vmax:       0.86 m/s AV Area (Vmean):   2.25 cm     PV Peak grad:  3.0 mmHg AV Area (VTI):     2.24 cm AV Vmax:           119.00 cm/s AV Vmean:          80.800 cm/s AV VTI:            0.221 m AV Peak Grad:      5.7 mmHg AV Mean Grad:      3.0 mmHg LVOT Vmax:         82.70 cm/s LVOT Vmean:        52.500 cm/s LVOT VTI:          0.143 m LVOT/AV VTI ratio: 0.65  AORTA Ao Root diam: 3.00 cm Ao Asc diam:  2.90 cm MITRAL VALVE               TRICUSPID VALVE MV Area (PHT): 3.63 cm    TR Peak grad:   18.3 mmHg MV Decel Time: 209 msec    TR Vmax:        214.00 cm/s MV E velocity: 79.40 cm/s MV A velocity: 69.60 cm/s  SHUNTS MV E/A ratio:  1.14        Systemic VTI:  0.14 m                             Systemic Diam: 2.10 cm Buford Dresser MD Electronically signed by Buford Dresser MD Signature Date/Time: 04/16/2022/4:48:59 PM    Final    DG Chest Port 1 View  Result Date: 04/16/2022 CLINICAL DATA:  Tracheostomy tube. EXAM: PORTABLE CHEST 1 VIEW COMPARISON:  04/13/2022 FINDINGS: Retrocardiac left base collapse/consolidation is similar to prior. There is pulmonary vascular congestion without overt pulmonary edema. Cardiopericardial silhouette is at upper limits of normal for size. Tracheostomy tube again noted. Left PICC line tip overlies the SVC/RA junction Telemetry leads overlie the chest. IMPRESSION: 1. Similar appearance of retrocardiac left base collapse/consolidation. 2. Pulmonary vascular congestion without overt pulmonary edema. Electronically Signed   By: Misty Stanley M.D.   On: 04/16/2022 13:28   DG CHEST PORT 1 VIEW  Result Date: 04/13/2022 CLINICAL DATA:  Pneumonia EXAM: PORTABLE CHEST 1 VIEW COMPARISON:  Radiograph 04/10/2022 FINDINGS: Tracheostomy tube overlies the midthoracic trachea. Unchanged cardiomediastinal silhouette. There is persistent left basilar airspace disease, not significantly changed from prior. Right lung is clear. No large effusion or evidence of pneumothorax. Left upper extremity PICC tip overlies the superior cavoatrial junction. Bones are unchanged. IMPRESSION: Persistent left basilar airspace disease, not significantly changed from prior. Electronically Signed   By: Maurine Simmering M.D.   On: 04/13/2022 08:11   DG CHEST PORT 1 VIEW  Result Date: 04/10/2022 CLINICAL DATA:  Acute hypercapnic respiratory failure. EXAM: PORTABLE CHEST 1 VIEW COMPARISON:  Radiograph 04/05/2022, CT 03/30/2022 FINDINGS: Tracheostomy tube tip at the thoracic inlet. Retrocardiac opacity has slightly worsened from prior exam. Mild persistent volume loss in the left hemithorax. There may be a small left pleural effusion. Slight increasing streaky densities at the  right lung base stable heart size and mediastinal contours. No pneumothorax. IMPRESSION: 1. Retrocardiac opacity has slightly worsened from prior exam, pneumonia versus collapse/atelectasis. 2. Increasing streaky right basilar opacities. Electronically Signed   By: Keith Rake M.D.   On: 04/10/2022 11:24   DG CHEST PORT 1 VIEW  Result Date: 04/05/2022 CLINICAL DATA:  Respiratory failure with hypoxia. EXAM: PORTABLE CHEST 1 VIEW COMPARISON:  04/02/2022 FINDINGS: Tracheostomy tube appears stable. Left arm PICC line with the tip in the SVC region. Persistent hazy densities in the mid and lower left lung are unchanged. Heart size is normal. Negative for a pneumothorax. Right lung remains clear. No acute bone abnormality. IMPRESSION: Persistent hazy densities in the mid and lower left lung. No significant change from recent comparison examination. Electronically Signed   By: Markus Daft M.D.   On: 04/05/2022 11:11   US RENAL  Result Date: 04/04/2022 CLINICAL DATA:  Renal failure EXAM: RENAL / URINARY TRACT ULTRASOUND COMPLETE COMPARISON:  None Available. FINDINGS: Right Kidney: Renal measurements: 11.2 x 4.5 x 6.0 cm = volume: 157 mL. Echogenicity within normal limits. No mass or hydronephrosis visualized. Left Kidney: Renal measurements: 11.4 x 6.1 x 5.9 cm =  volume: 211 mL. Echogenicity within normal limits. No mass or hydronephrosis visualized. Bladder: By report, the patient has a Foley catheter. The balloon is not identified on provided images. The bladder is decompressed. Other: None. IMPRESSION: 1. The kidneys are normal in appearance. 2. The bladder is decompressed. By report, the patient has a Foley catheter. The balloon is not identified on provided images. Electronically Signed   By: Dorise Bullion III M.D.   On: 04/04/2022 10:29   DG Chest Port 1 View  Result Date: 04/02/2022 CLINICAL DATA:  Respiratory failure. EXAM: PORTABLE CHEST 1 VIEW COMPARISON:  Chest radiograph 03/27/2022.  Chest CT  03/30/2022. FINDINGS: 0521 hours. Rotated patient. Unchanged midline tracheostomy and left upper extremity PICC with tip projecting over the superior cavoatrial junction. Markedly improved aeration of the left lung with residual hazy opacity favored to reflect mild re-expansion pulmonary edema. Resolved right middle lobe atelectasis. Stable cardiac and mediastinal contours. Possible trace left pleural effusion. No pneumothorax. IMPRESSION: 1. Markedly improved aeration of the left lung and right middle lobe with residual hazy opacity favored to reflect mild re-expansion pulmonary edema. 2. Possible trace left pleural effusion. Electronically Signed   By: Emmit Alexanders M.D.   On: 04/02/2022 08:17   MR BRAIN W WO CONTRAST  Result Date: 03/30/2022 CLINICAL DATA:  Anterior spinal artery infarction of C-spine now with vertical nystagmus. EXAM: MRI HEAD WITHOUT AND WITH CONTRAST TECHNIQUE: Multiplanar, multiecho pulse sequences of the brain and surrounding structures were obtained without and with intravenous contrast. CONTRAST:  9.25mL GADAVIST GADOBUTROL 1 MMOL/ML IV SOLN COMPARISON:  Brain MRI 03/20/2022. FINDINGS: Brain: There is persistent faint elevated DWI signal in the upper cervical cord with the revolving T2 hyperintensity consistent with revolving infarct (2-3, 5-1). There is no evidence of new acute intracranial hemorrhage, extra-axial fluid collection, or new acute infarct. Parenchymal volume is normal. The ventricles are normal in size. Parenchymal signal is normal. The pituitary and suprasellar region are normal. There is no mass lesion or abnormal enhancement. There is no mass effect or midline shift. Vascular: Normal flow voids. Skull and upper cervical spine: Normal marrow signal. Sinuses/Orbits: The paranasal sinuses are clear. The globes and orbits are unremarkable. The optic nerves are grossly unremarkable on these nondedicated sequences. The optic chiasm is normal. Other: None. IMPRESSION: 1.  Evolving infarct in the imaged upper cervical cord. 2. No new acute intracranial pathology. Electronically Signed   By: Valetta Mole M.D.   On: 03/30/2022 16:27   CT CHEST WO CONTRAST  Result Date: 03/30/2022 CLINICAL DATA:  Respiratory illness, persistent fevers EXAM: CT CHEST WITHOUT CONTRAST TECHNIQUE: Multidetector CT imaging of the chest was performed following the standard protocol without IV contrast. RADIATION DOSE REDUCTION: This exam was performed according to the departmental dose-optimization program which includes automated exposure control, adjustment of the mA and/or kV according to patient size and/or use of iterative reconstruction technique. COMPARISON:  Chest radiographs, 03/27/2022 FINDINGS: Cardiovascular: Left upper extremity PICC, tip within the right atrium. Normal heart size. No pericardial effusion. Mediastinum/Nodes: Leftward shift of the mediastinal contents secondary to volume loss of the left hemithorax. No enlarged mediastinal, hilar, or axillary lymph nodes. Tracheostomy. Thyroid gland and esophagus demonstrate no significant findings. Lungs/Pleura: Essentially complete atelectasis or consolidation of the left lung with air bronchograms throughout. Extensive volume loss. Additional complete atelectasis or consolidation of the right middle lobe. Scattered ground-glass airspace opacities in the right upper lobe (series 6, image 39). Upper Abdomen: No acute abnormality. Musculoskeletal: No chest wall abnormality. No  acute osseous findings. IMPRESSION: 1. Essentially complete atelectasis or consolidation of the left lung with air bronchograms throughout. Extensive volume loss with leftward shift of the mediastinal contents. 2. Additional complete atelectasis or consolidation of the right middle lobe. 3. Scattered ground-glass airspace opacities in the right upper lobe, infectious or inflammatory. 4. Tracheostomy. Electronically Signed   By: Delanna Ahmadi M.D.   On: 03/30/2022 12:03    DG Chest Port 1 View  Result Date: 03/27/2022 CLINICAL DATA:  Multifocal pneumonia. EXAM: PORTABLE CHEST 1 VIEW COMPARISON:  Chest x-rays dated 03/26/2022 and 03/25/2022 FINDINGS: Improved aeration at the LEFT lung apex. Persistent dense opacity within the LEFT lower lung. RIGHT lung is clear. No pneumothorax is seen. LEFT-sided PICC line appears adequately positioned with tip at the level of the mid/lower SVC. Enteric tube has been removed. Tracheostomy tube appears appropriately positioned in the midline. IMPRESSION: 1. Improved aeration at the LEFT lung apex. 2. Persistent dense opacity within the LEFT lower lung, compatible with pneumonia and/or airspace collapse, favor airspace collapse due to the evidence of volume loss. 3. RIGHT lung is clear. 4. Enteric tube has been removed. Electronically Signed   By: Franki Cabot M.D.   On: 03/27/2022 09:00   IR GASTROSTOMY TUBE MOD SED  Result Date: 03/26/2022 INDICATION: Need for long-term enteric access, malnutrition EXAM: Placement of percutaneous gastrostomy tube using fluoroscopic guidance MEDICATIONS: Documented in the EMR ANESTHESIA/SEDATION: Moderate (conscious) sedation was employed during this procedure. A total of Versed 2 mg and Fentanyl 100 mcg was administered intravenously by the radiology nurse. Total intra-service moderate Sedation Time: 16 minutes. The patient's level of consciousness and vital signs were monitored continuously by radiology nursing throughout the procedure under my direct supervision. CONTRAST:  15 mL Omnipaque 300-administered into the gastric lumen. FLUOROSCOPY: Radiation Exposure Index (as provided by the fluoroscopic device): 2.1 minutes (11 mGy) COMPLICATIONS: None immediate. PROCEDURE: Informed written consent was obtained from the patient after a thorough discussion of the procedural risks, benefits and alternatives. All questions were addressed. Maximal Sterile Barrier Technique was utilized including caps, mask,  sterile gowns, sterile gloves, sterile drape, hand hygiene and skin antiseptic. A timeout was performed prior to the initiation of the procedure. Review of pre-procedure CT scan demonstrates an adequate window for percutaneous placement of a gastrostomy tube. The abdomen was prepped and draped in the standard sterile fashion. After insufflating the stomach with air, puncture sites were selected and local analgesia was obtained with 1% lidocaine. Using fluoroscopic guidance, a gastropexy needle was advanced into the stomach and the T-bar suture was released. Entry into the stomach was confirmed with fluoroscopy, aspiration of air, and injection of contrast material. This was repeated with an additional gastropexy suture (for a total of 2 fasteners). At the center of these gastropexy sutures, a dermatotomy was performed. An 18 gauge needle was then passed into the stomach, and position within the gastric lumen again confirmed under fluoroscopy using aspiration of air and injection of contrast material. An Amplatz guidewire was passed through this needle and intraluminal placement was confirmed with fluoroscopy. The needle was removed, and over the guidewire, a 20 French balloon gastrostomy tube with a coaxial 10 mm balloon was advanced into the percutaneous tract. Dilation of the percutaneous tract was then performed using the balloon, followed by advancement of the gastrostomy tube into the gastric lumen. The retention balloon was then inflated with 20 mL of sterile water, and the tube was brought back to the gastric wall. The wire and balloon were  removed. The external bumper was brought to the skin. Location of the gastrostomy tube within the stomach was then confirmed with injection of contrast material, opacifying the gastric lumen. The gastrostomy tube was flushed with sterile water, and secured to the skin using a dressing. The patient tolerated the procedure well without immediate complication, and was  transferred to recovery in stable condition. IMPRESSION: Successful placement of a percutaneous 20 French balloon retention gastrostomy tube using fluoroscopic guidance. Electronically Signed   By: Albin Felling M.D.   On: 03/26/2022 13:57   DG Chest Port 1 View  Result Date: 03/26/2022 CLINICAL DATA:  22 year old female with respiratory failure. Tracheostomy. EXAM: PORTABLE CHEST 1 VIEW COMPARISON:  Portable chest yesterday and earlier. FINDINGS: Portable AP semi upright view at 0728 hours. Tracheostomy tube appears stable. Enteric feeding tube remains in place, courses to the abdomen. New white out of the left lung and evidence of left hemithorax volume loss with leftward shift of the mediastinum. Right lung appears clear. No pneumothorax. No osseous abnormality identified.  Negative visible bowel gas. IMPRESSION: 1. Acute white out of the left hemithorax, favor left lung mucous plugging. Recommend pulmonary toilet and repeat portable chest. 2. Right lung appears clear. Stable tracheostomy and visible enteric feeding tube. Electronically Signed   By: Genevie Ann M.D.   On: 03/26/2022 07:43   DG CHEST PORT 1 VIEW  Result Date: 03/25/2022 CLINICAL DATA:  22 year old female with history of hypoxia. EXAM: PORTABLE CHEST 1 VIEW COMPARISON:  Chest x-ray 03/22/2022. FINDINGS: A tracheostomy tube is in place with tip 5.0 cm above the carina. A feeding tube is seen extending into the abdomen, however, the tip of the feeding tube extends below the lower margin of the image. Lung volumes are low. No consolidative airspace disease. Probable trace right pleural effusion. No left pleural effusion. No pneumothorax. No evidence of pulmonary edema. Heart size is normal. Upper mediastinal contours are within normal limits. IMPRESSION: 1. Support apparatus, as above. 2. Trace right pleural effusion. Electronically Signed   By: Vinnie Langton M.D.   On: 03/25/2022 07:03   DG Abd Portable 1V  Result Date:  03/24/2022 CLINICAL DATA:  Feeding tube placement EXAM: PORTABLE ABDOMEN - 1 VIEW COMPARISON:  03/22/2022 FINDINGS: Feeding tube tip in the mid stomach. IMPRESSION: Feeding tube in the stomach. Electronically Signed   By: Rolm Baptise M.D.   On: 03/24/2022 15:10   CT ABDOMEN WO CONTRAST  Result Date: 03/23/2022 CLINICAL DATA:  Unintended weight loss EXAM: CT ABDOMEN WITHOUT CONTRAST TECHNIQUE: Multidetector CT imaging of the abdomen was performed following the standard protocol without IV contrast. RADIATION DOSE REDUCTION: This exam was performed according to the departmental dose-optimization program which includes automated exposure control, adjustment of the mA and/or kV according to patient size and/or use of iterative reconstruction technique. COMPARISON:  03/22/2022, 06/21/2017 FINDINGS: Lower chest: Dense consolidation within the lower lobes, left greater than right, favor atelectasis. Trace bilateral pleural fluid. Hepatobiliary: Unremarkable unenhanced appearance of the liver and gallbladder. Pancreas: Unremarkable unenhanced appearance. Spleen: Unremarkable unenhanced appearance. Adrenals/Urinary Tract: No urinary tract calculi or obstructive uropathy within either kidney. The adrenals are unremarkable. Stomach/Bowel: No bowel obstruction or ileus. No bowel wall thickening or inflammatory change. Enteric catheter extends into the gastric lumen, tip within the gastric body. Vascular/Lymphatic: No significant vascular findings are present. No abdominal lymphadenopathy. Other: No free fluid or free intraperitoneal gas. No abdominal wall hernia. Musculoskeletal: No acute or destructive bony lesions. Reconstructed images demonstrate no additional findings. IMPRESSION: 1.  Dense bibasilar consolidation, left greater than right, favor atelectasis. 2. Enteric catheter within the gastric lumen. 3. Otherwise unremarkable unenhanced CT of the abdomen. Electronically Signed   By: Randa Ngo M.D.   On:  03/23/2022 22:32   ECHO TEE  Result Date: 03/23/2022    TRANSESOPHOGEAL ECHO REPORT   Patient Name:   NAZ LARROW Date of Exam: 03/23/2022 Medical Rec #:  [id number]        Height:       67.0 in Accession #:    [id number]       Weight:       223.1 lb Date of Birth:  2000-06-26         BSA:          2.118 m Patient Age:    22 years         BP:           134/79 mmHg Patient Gender: F                HR:           65 bpm. Exam Location:  Inpatient Procedure: Transesophageal Echo, Color Doppler, Cardiac Doppler and Saline            Contrast Bubble Study Indications:    Stroke  History:        Patient has prior history of Echocardiogram examinations, most                 recent 03/20/2022. Risk Factors:Dyslipidemia. Opiates, benzos,                 THC use; Nexplanon.  Sonographer:    Eartha Inch Referring Phys: [id number] Margie Billet PROCEDURE: After discussion of the risks and benefits of a TEE, an informed consent was obtained from the patient. The patient was recently extubated. The transesophogeal probe was passed without difficulty through the esophogus of the patient. Imaged were obtained with the patient in a supine position. Sedation performed by performing physician. Patients was under conscious sedation during this procedure. Anesthetic administered: 52mcg of Fentanyl, 4.0mg  of Versed. Image quality was good. The patient's vital signs; including heart rate, blood pressure, and oxygen saturation; remained stable throughout the procedure. The patient developed no complications during the procedure.  IMPRESSIONS  1. Left ventricular ejection fraction, by estimation, is 55 to 60%. The left ventricle has normal function.  2. Right ventricular systolic function is normal. The right ventricular size is normal.  3. No left atrial/left atrial appendage thrombus was detected.  4. The mitral valve is normal in structure. Trivial mitral valve regurgitation.  5. The aortic valve is tricuspid. Aortic valve regurgitation  is trivial.  6. Agitated saline contrast bubble study was negative, with no evidence of any interatrial shunt both at rest and with valsalva. There was one late bubble consistent with a small intrapulmonary shunt. FINDINGS  Left Ventricle: Left ventricular ejection fraction, by estimation, is 55 to 60%. The left ventricle has normal function. The left ventricular internal cavity size was normal in size. Right Ventricle: The right ventricular size is normal. No increase in right ventricular wall thickness. Right ventricular systolic function is normal. Left Atrium: Left atrial size was normal in size. No left atrial/left atrial appendage thrombus was detected. Right Atrium: Right atrial size was normal in size. Pericardium: There is no evidence of pericardial effusion. Mitral Valve: The mitral valve is normal in structure. Trivial mitral valve regurgitation. Tricuspid Valve: The tricuspid valve is normal in structure.  Tricuspid valve regurgitation is trivial. Aortic Valve: The aortic valve is tricuspid. Aortic valve regurgitation is trivial. Pulmonic Valve: The pulmonic valve was normal in structure. Pulmonic valve regurgitation is trivial. Aorta: The aortic root is normal in size and structure. IAS/Shunts: No atrial level shunt detected by color flow Doppler. Agitated saline contrast was given intravenously to evaluate for intracardiac shunting. Agitated saline contrast bubble study was negative, with no evidence of any interatrial shunt. Additional Comments: Spectral Doppler performed. Gwyndolyn Kaufman MD Electronically signed by Gwyndolyn Kaufman MD Signature Date/Time: 03/23/2022/12:56:54 PM    Final     Microbiology Recent Results (from the past 240 hour(s))  Culture, blood (Routine X 2) w Reflex to ID Panel     Status: Abnormal   Collection Time: 04/16/22  5:04 PM   Specimen: BLOOD RIGHT ARM  Result Value Ref Range Status   Specimen Description BLOOD RIGHT ARM  Final   Special Requests   Final     BOTTLES DRAWN AEROBIC AND ANAEROBIC Blood Culture adequate volume   Culture  Setup Time   Final    GRAM POSITIVE COCCI AEROBIC BOTTLE ONLY CRITICAL RESULT CALLED TO, READ BACK BY AND VERIFIED WITH: T RUDISILL,PHARMD@0500  19-Apr-2022 [place]    Culture (A)  Final    STAPHYLOCOCCUS EPIDERMIDIS THE SIGNIFICANCE OF ISOLATING THIS ORGANISM FROM A SINGLE SET OF BLOOD CULTURES WHEN MULTIPLE SETS ARE DRAWN IS UNCERTAIN. PLEASE NOTIFY THE MICROBIOLOGY DEPARTMENT WITHIN ONE WEEK IF SPECIATION AND SENSITIVITIES ARE REQUIRED. Performed at [facility], [place] [address], [place], [place] [address]    Report Status 04/19/2022 FINAL  Final  Blood Culture ID Panel (Reflexed)     Status: Abnormal   Collection Time: 04/16/22  5:04 PM  Result Value Ref Range Status   Enterococcus faecalis NOT DETECTED NOT DETECTED Final   Enterococcus Faecium NOT DETECTED NOT DETECTED Final   Listeria monocytogenes NOT DETECTED NOT DETECTED Final   Staphylococcus species DETECTED (A) NOT DETECTED Final    Comment: CRITICAL RESULT CALLED TO, READ BACK BY AND VERIFIED WITH: T RUDISILL,PHAMD@0501  2022-04-19 [place]    Staphylococcus aureus (BCID) NOT DETECTED NOT DETECTED Final   Staphylococcus epidermidis DETECTED (A) NOT DETECTED Final    Comment: Methicillin (oxacillin) resistant coagulase negative staphylococcus. Possible blood culture contaminant (unless isolated from more than one blood culture draw or clinical case suggests pathogenicity). No antibiotic treatment is indicated for blood  culture contaminants. CRITICAL RESULT CALLED TO, READ BACK BY AND VERIFIED WITH: T RUDISILL,PHARMD@0500  2022/04/19 [place]    Staphylococcus lugdunensis NOT DETECTED NOT DETECTED Final   Streptococcus species NOT DETECTED NOT DETECTED Final   Streptococcus agalactiae NOT DETECTED NOT DETECTED Final   Streptococcus pneumoniae NOT DETECTED NOT DETECTED Final   Streptococcus pyogenes NOT DETECTED NOT DETECTED Final   A.calcoaceticus-baumannii NOT  DETECTED NOT DETECTED Final   Bacteroides fragilis NOT DETECTED NOT DETECTED Final   Enterobacterales NOT DETECTED NOT DETECTED Final   Enterobacter cloacae complex NOT DETECTED NOT DETECTED Final   Escherichia coli NOT DETECTED NOT DETECTED Final   Klebsiella aerogenes NOT DETECTED NOT DETECTED Final   Klebsiella oxytoca NOT DETECTED NOT DETECTED Final   Klebsiella pneumoniae NOT DETECTED NOT DETECTED Final   Proteus species NOT DETECTED NOT DETECTED Final   Salmonella species NOT DETECTED NOT DETECTED Final   Serratia marcescens NOT DETECTED NOT DETECTED Final   Haemophilus influenzae NOT DETECTED NOT DETECTED Final   Neisseria meningitidis NOT DETECTED NOT DETECTED Final   Pseudomonas aeruginosa NOT DETECTED NOT DETECTED Final  Stenotrophomonas maltophilia NOT DETECTED NOT DETECTED Final   Candida albicans NOT DETECTED NOT DETECTED Final   Candida auris NOT DETECTED NOT DETECTED Final   Candida glabrata NOT DETECTED NOT DETECTED Final   Candida krusei NOT DETECTED NOT DETECTED Final   Candida parapsilosis NOT DETECTED NOT DETECTED Final   Candida tropicalis NOT DETECTED NOT DETECTED Final   Cryptococcus neoformans/gattii NOT DETECTED NOT DETECTED Final   Methicillin resistance mecA/C DETECTED (A) NOT DETECTED Final    Comment: CRITICAL RESULT CALLED TO, READ BACK BY AND VERIFIED WITH: T RUDISILL,PHARMD@0500  05-16-22 [place] Performed at [facility], [place] [address] [place], [place] [address]   Culture, BAL-quantitative w Gram Stain     Status: None   Collection Time: 04/16/22  5:14 PM   Specimen: Bronchoalveolar Lavage  Result Value Ref Range Status   Specimen Description BRONCHIAL ALVEOLAR LAVAGE  Final   Special Requests LEFT  Final   Gram Stain   Final    MODERATE WBC PRESENT,BOTH PMN AND MONONUCLEAR NO ORGANISMS SEEN    Culture   Final    NO GROWTH 2 DAYS Performed at [facility], [place] [address], [place], [place] [address]    Report Status 04/19/2022 FINAL   Final  Culture, BAL-quantitative w Gram Stain     Status: None   Collection Time: 04/16/22  5:18 PM   Specimen: Bronchoalveolar Lavage  Result Value Ref Range Status   Specimen Description BRONCHIAL ALVEOLAR LAVAGE  Final   Special Requests RIGHT  Final   Gram Stain   Final    FEW WBC PRESENT, PREDOMINANTLY PMN NO ORGANISMS SEEN    Culture   Final    NO GROWTH 2 DAYS Performed at [facility], [place] [address] [place], [place] [address]    Report Status 04/19/2022 FINAL  Final  Culture, blood (Routine X 2) w Reflex to ID Panel     Status: None   Collection Time: 04/16/22  5:49 PM   Specimen: BLOOD  Result Value Ref Range Status   Specimen Description BLOOD SITE NOT SPECIFIED  Final   Special Requests   Final    BOTTLES DRAWN AEROBIC AND ANAEROBIC Blood Culture results may not be optimal due to an excessive volume of blood received in culture bottles   Culture   Final    NO GROWTH 5 DAYS Performed at [facility], [place] [address] [place], [place] [address]    Report Status 04/21/2022 FINAL  Final  Urine Culture     Status: None   Collection Time: 04/16/22  8:10 PM   Specimen: Urine, Catheterized  Result Value Ref Range Status   Specimen Description URINE, CATHETERIZED  Final   Special Requests NONE  Final   Culture   Final    NO GROWTH Performed at [place] [address] [place], [place] [address]    Report Status 05/16/22 FINAL  Final  Culture, blood (Routine X 2) w Reflex to ID Panel     Status: Abnormal   Collection Time: 16-May-2022  9:03 AM   Specimen: BLOOD RIGHT HAND  Result Value Ref Range Status   Specimen Description BLOOD RIGHT HAND  Final   Special Requests   Final    BOTTLES DRAWN AEROBIC AND ANAEROBIC Blood Culture adequate volume   Culture  Setup Time   Final    ANAEROBIC BOTTLE ONLY GRAM POSITIVE COCCI IN CLUSTERS CRITICAL VALUE NOTED.  VALUE IS CONSISTENT WITH PREVIOUSLY REPORTED  AND CALLED VALUE.    Culture STAPHYLOCOCCUS  EPIDERMIDIS (A)  Final   Report Status 04/21/2022 FINAL  Final   Organism ID, Bacteria STAPHYLOCOCCUS EPIDERMIDIS  Final      Susceptibility   Staphylococcus epidermidis - MIC*    CIPROFLOXACIN >=8 RESISTANT Resistant     ERYTHROMYCIN >=8 RESISTANT Resistant     GENTAMICIN <=0.5 SENSITIVE Sensitive     OXACILLIN >=4 RESISTANT Resistant     TETRACYCLINE >=16 RESISTANT Resistant     VANCOMYCIN 1 SENSITIVE Sensitive     TRIMETH/SULFA 80 RESISTANT Resistant     CLINDAMYCIN >=8 RESISTANT Resistant     RIFAMPIN <=0.5 SENSITIVE Sensitive     Inducible Clindamycin NEGATIVE Sensitive     * STAPHYLOCOCCUS EPIDERMIDIS    Lab Basic Metabolic Panel: Recent Labs  Lab 04/17/22 1200 04/17/22 1243 04/17/22 1639 04/17/22 1758 05/08/22 0100 May 08, 2022 0621 2022/05/08 1232  NA 129*   < > 130* 131* 132* 131* 133*  K 4.3   < > 4.4 4.5 3.9 4.5 4.5  CL 92*  --   --  93* 98 96* 95*  CO2 25  --   --  26 25 25 25   GLUCOSE 102*  --   --  91 108* 107* 118*  BUN 12  --   --  14 13 14 14   CREATININE 0.54  --   --  0.60 0.69 0.66 0.65  CALCIUM 8.9  --   --  9.2 9.2 9.0 9.0  MG 1.8  --   --  1.8 1.9 1.9 2.0  PHOS 4.5  --   --  6.1* 7.0* 7.8* 7.8*   < > = values in this interval not displayed.   Liver Function Tests: Recent Labs  Lab 04/17/22 1200 04/17/22 1758 05/08/2022 0100 2022-05-08 0621 05/08/2022 1232  AST 27 22 21 21 21   ALT 168* 144* 134* 124* 113*  ALKPHOS 101 101 93 98 98  BILITOT 0.4 0.7 0.8 0.6 1.5*  PROT 5.9* 6.1* 6.3* 6.1* 6.4*  ALBUMIN 2.0* 2.6* 2.5* 2.5* 2.4*   Recent Labs  Lab 04/17/22 1200 04/17/22 1758 08-May-2022 0100 May 08, 2022 0621 05/08/22 1232  LIPASE 70* 64* 58* 59* 57*  AMYLASE 40 40 38 39 37   No results for input(s): "AMMONIA" in the last 168 hours. CBC: Recent Labs  Lab 04/17/22 1200 04/17/22 1243 04/17/22 1639 04/17/22 1758 05/08/2022 0100 05-08-22 0621 05-08-22 1232  WBC 15.4*  --   --  13.3* 13.6* 15.8* 15.3*  HGB 8.5*   < > 8.5* 7.8* 8.1* 7.9* 8.9*  HCT  25.6*   < > 25.0* 23.6* 24.4* 24.3* 26.9*  MCV 84.5  --   --  84.9 84.4 84.4 82.8  PLT 349  --   --  319 319 320 317   < > = values in this interval not displayed.   Cardiac Enzymes: Recent Labs  Lab 04/17/22 1200 04/17/22 1758 May 08, 2022 0100 May 08, 2022 0621 05-08-2022 1232  CKTOTAL 41 49 52 54 93   Sepsis Labs: Recent Labs  Lab 04/15/22 1306 04/16/22 1658 04/17/22 1758 05-08-22 0100 May 08, 2022 0621 2022-05-08 1232  PROCALCITON 0.24  --   --   --   --   --   WBC  --    < > 13.3* 13.6* 15.8* 15.3*  LATICACIDVEN  --    < > 0.7 0.7 0.6 1.3   < > = values in this interval not displayed.     Candee Furbish 04/21/2022, 5:59 PM

## 2022-05-03 DEATH — deceased
